# Patient Record
Sex: Male | Born: 1945 | Race: White | Hispanic: No | Marital: Single | Smoking: Never smoker
Health system: Southern US, Community
[De-identification: ages and names within clinical notes are randomized; demographics above are authoritative.]

## PROBLEM LIST (undated history)

## (undated) DIAGNOSIS — E7800 Pure hypercholesterolemia, unspecified: Secondary | ICD-10-CM

## (undated) DIAGNOSIS — M1990 Unspecified osteoarthritis, unspecified site: Secondary | ICD-10-CM

## (undated) DIAGNOSIS — M109 Gout, unspecified: Secondary | ICD-10-CM

## (undated) DIAGNOSIS — G47419 Narcolepsy without cataplexy: Secondary | ICD-10-CM

## (undated) DIAGNOSIS — F329 Major depressive disorder, single episode, unspecified: Secondary | ICD-10-CM

## (undated) DIAGNOSIS — K7460 Unspecified cirrhosis of liver: Secondary | ICD-10-CM

## (undated) DIAGNOSIS — I00 Rheumatic fever without heart involvement: Secondary | ICD-10-CM

## (undated) DIAGNOSIS — R188 Other ascites: Secondary | ICD-10-CM

## (undated) DIAGNOSIS — G4730 Sleep apnea, unspecified: Secondary | ICD-10-CM

## (undated) DIAGNOSIS — R600 Localized edema: Secondary | ICD-10-CM

## (undated) DIAGNOSIS — M87052 Idiopathic aseptic necrosis of left femur: Secondary | ICD-10-CM

## (undated) DIAGNOSIS — I10 Essential (primary) hypertension: Secondary | ICD-10-CM

## (undated) DIAGNOSIS — F32A Depression, unspecified: Secondary | ICD-10-CM

## (undated) DIAGNOSIS — T63331A Toxic effect of venom of brown recluse spider, accidental (unintentional), initial encounter: Secondary | ICD-10-CM

## (undated) HISTORY — PX: COSMETIC SURGERY: SHX468

## (undated) HISTORY — PX: FRACTURE SURGERY: SHX138

## (undated) HISTORY — PX: INGUINAL HERNIA REPAIR: SUR1180

## (undated) HISTORY — PX: FOOT FRACTURE SURGERY: SHX645

## (undated) SURGERY — EGD (ESOPHAGOGASTRODUODENOSCOPY)
Anesthesia: Monitor Anesthesia Care

---
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
## 2000-10-07 ENCOUNTER — Ambulatory Visit (HOSPITAL_COMMUNITY): Admission: RE | Admit: 2000-10-07 | Discharge: 2000-10-07 | Payer: Self-pay | Admitting: Pulmonary Disease

## 2002-02-14 ENCOUNTER — Ambulatory Visit: Admission: RE | Admit: 2002-02-14 | Discharge: 2002-02-14 | Payer: Self-pay | Admitting: Pulmonary Disease

## 2002-05-23 ENCOUNTER — Ambulatory Visit: Admission: RE | Admit: 2002-05-23 | Discharge: 2002-05-23 | Payer: Self-pay | Admitting: Pulmonary Disease

## 2002-06-14 ENCOUNTER — Ambulatory Visit (HOSPITAL_COMMUNITY): Admission: RE | Admit: 2002-06-14 | Discharge: 2002-06-14 | Payer: Self-pay | Admitting: Pulmonary Disease

## 2002-06-14 ENCOUNTER — Encounter: Payer: Self-pay | Admitting: Pulmonary Disease

## 2002-07-20 ENCOUNTER — Encounter (HOSPITAL_COMMUNITY): Admission: RE | Admit: 2002-07-20 | Discharge: 2002-08-19 | Payer: Self-pay | Admitting: Pulmonary Disease

## 2003-09-26 ENCOUNTER — Ambulatory Visit (HOSPITAL_COMMUNITY): Admission: RE | Admit: 2003-09-26 | Discharge: 2003-09-26 | Payer: Self-pay | Admitting: Pulmonary Disease

## 2005-05-28 ENCOUNTER — Ambulatory Visit: Payer: Self-pay | Admitting: Unknown Physician Specialty

## 2005-05-31 ENCOUNTER — Ambulatory Visit: Payer: Self-pay | Admitting: Specialist

## 2005-06-03 ENCOUNTER — Ambulatory Visit: Payer: Self-pay | Admitting: Specialist

## 2005-06-10 ENCOUNTER — Ambulatory Visit: Payer: Self-pay | Admitting: Specialist

## 2005-09-01 ENCOUNTER — Ambulatory Visit: Payer: Self-pay | Admitting: Unknown Physician Specialty

## 2007-11-08 ENCOUNTER — Ambulatory Visit (HOSPITAL_COMMUNITY): Admission: RE | Admit: 2007-11-08 | Discharge: 2007-11-08 | Payer: Self-pay | Admitting: Pulmonary Disease

## 2008-03-08 ENCOUNTER — Ambulatory Visit (HOSPITAL_COMMUNITY): Admission: RE | Admit: 2008-03-08 | Discharge: 2008-03-08 | Payer: Self-pay | Admitting: Pulmonary Disease

## 2008-03-11 ENCOUNTER — Inpatient Hospital Stay (HOSPITAL_COMMUNITY): Admission: RE | Admit: 2008-03-11 | Discharge: 2008-03-13 | Payer: Self-pay | Admitting: Orthopedic Surgery

## 2008-03-11 HISTORY — PX: TOTAL KNEE ARTHROPLASTY: SHX125

## 2008-05-30 ENCOUNTER — Emergency Department (HOSPITAL_COMMUNITY): Admission: EM | Admit: 2008-05-30 | Discharge: 2008-05-30 | Payer: Self-pay | Admitting: Emergency Medicine

## 2010-05-20 LAB — BASIC METABOLIC PANEL
CO2: 22 mEq/L (ref 19–32)
Chloride: 103 mEq/L (ref 96–112)
GFR calc Af Amer: >60 mL/min (ref >60)
Sodium: 140 mEq/L (ref 135–145)

## 2010-05-20 LAB — RAPID URINE DRUG SCREEN, HOSP PERFORMED
Benzodiazepines: NOT DETECTED
Cocaine: POSITIVE — AB
Tetrahydrocannabinol: POSITIVE — AB

## 2010-05-20 LAB — DIFFERENTIAL
Basophils Relative: 1 % (ref 0–1)
Eosinophils Absolute: 0.1 10*3/uL (ref 0.0–0.7)
Eosinophils Relative: 1 % (ref 0–5)
Monocytes Absolute: 0.9 10*3/uL (ref 0.1–1.0)
Monocytes Relative: 12 % (ref 3–12)

## 2010-05-20 LAB — CBC
Hemoglobin: 16.3 g/dL (ref 13.0–17.0)
MCHC: 35 g/dL (ref 30.0–36.0)
MCV: 96.7 fL (ref 78.0–100.0)
RBC: 4.81 MIL/uL (ref 4.22–5.81)

## 2010-05-25 LAB — COMPREHENSIVE METABOLIC PANEL
AST: 58 U/L — ABNORMAL HIGH (ref 0–37)
Albumin: 4 g/dL (ref 3.5–5.2)
Chloride: 103 mEq/L (ref 96–112)
Creatinine, Ser: 0.71 mg/dL (ref 0.4–1.5)
GFR calc Af Amer: >60 mL/min (ref >60)
Potassium: 4.2 mEq/L (ref 3.5–5.1)
Total Bilirubin: 1.6 mg/dL — ABNORMAL HIGH (ref 0.3–1.2)

## 2010-05-25 LAB — URINE CULTURE
Colony Count: NO GROWTH
Culture: NO GROWTH

## 2010-05-25 LAB — URINALYSIS, ROUTINE W REFLEX MICROSCOPIC
Glucose, UA: NEGATIVE mg/dL
Specific Gravity, Urine: 1.025 (ref 1.005–1.030)
pH: 5.5 (ref 5.0–8.0)

## 2010-05-25 LAB — DIFFERENTIAL
Basophils Absolute: 0 10*3/uL (ref 0.0–0.1)
Eosinophils Relative: 1 % (ref 0–5)
Lymphocytes Relative: 21 % (ref 12–46)
Monocytes Absolute: 1 10*3/uL (ref 0.1–1.0)

## 2010-05-25 LAB — CBC
MCV: 100.9 fL — ABNORMAL HIGH (ref 78.0–100.0)
Platelets: 228 10*3/uL (ref 150–400)
WBC: 9 10*3/uL (ref 4.0–10.5)

## 2010-05-26 LAB — CBC
HCT: 32.1 % — ABNORMAL LOW (ref 39.0–52.0)
Hemoglobin: 11.3 g/dL — ABNORMAL LOW (ref 13.0–17.0)
MCHC: 34.7 g/dL (ref 30.0–36.0)
MCHC: 35.3 g/dL (ref 30.0–36.0)
MCV: 100.4 fL — ABNORMAL HIGH (ref 78.0–100.0)
MCV: 103.5 fL — ABNORMAL HIGH (ref 78.0–100.0)
Platelets: 153 10*3/uL (ref 150–400)
RBC: 3.2 MIL/uL — ABNORMAL LOW (ref 4.22–5.81)
RDW: 12.4 % (ref 11.5–15.5)
RDW: 12.4 % (ref 11.5–15.5)
WBC: 10.7 10*3/uL — ABNORMAL HIGH (ref 4.0–10.5)

## 2010-05-26 LAB — BASIC METABOLIC PANEL
BUN: 7 mg/dL (ref 6–23)
BUN: 8 mg/dL (ref 6–23)
CO2: 25 mEq/L (ref 19–32)
CO2: 27 mEq/L (ref 19–32)
Calcium: 8.1 mg/dL — ABNORMAL LOW (ref 8.4–10.5)
Chloride: 102 mEq/L (ref 96–112)
Chloride: 98 mEq/L (ref 96–112)
Creatinine, Ser: 0.53 mg/dL (ref 0.4–1.5)
Creatinine, Ser: 0.79 mg/dL (ref 0.4–1.5)
GFR calc Af Amer: >60 mL/min (ref >60)
GFR calc Af Amer: >60 mL/min (ref >60)
GFR calc non Af Amer: >60 mL/min (ref >60)
Glucose, Bld: 125 mg/dL — ABNORMAL HIGH (ref 70–99)
Potassium: 3.4 mEq/L — ABNORMAL LOW (ref 3.5–5.1)
Potassium: 3.6 mEq/L (ref 3.5–5.1)
Sodium: 133 mEq/L — ABNORMAL LOW (ref 135–145)

## 2010-05-26 LAB — PROTIME-INR
INR: 1 (ref 0.00–1.49)
INR: 1.5 (ref 0.00–1.49)
Prothrombin Time: 13.8 seconds (ref 11.6–15.2)
Prothrombin Time: 18.4 seconds — ABNORMAL HIGH (ref 11.6–15.2)

## 2010-06-23 NOTE — Op Note (Signed)
NAME:  Lawrence West, Lawrence West NO.:  [id number]   MEDICAL RECORD NO.:  [id number]          PATIENT TYPE:  INP   LOCATION:  5040                         FACILITY:  [facility]   PHYSICIAN:  Elana Alm. Thurston Hole, M.D. 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   PROCEDURES:  1. Left total knee replacement using DePuy cemented total knee system      with #4 cemented femur, #5 cemented tibia with 15-mm polyethylene      RP tibial spacer, and 35-mm polyethylene cemented patella.  2. Left total knee computer-assisted navigation.   SURGEON:  Elana Alm. Thurston Hole, MD   ASSISTANT:  Julien Girt, PA   ANESTHESIA:  General.   OPERATIVE TIME:  1 hour and 45 minutes.   COMPLICATIONS:  None.   DESCRIPTION OF PROCEDURE:  Mr. Lawrence West was brought to the operating room  on March 11, 2008, after a femoral nerve block was placed in the  holding by Anesthesia.  He was placed on operative table in supine  position.  He received Ancef 1 g IV preoperatively for prophylaxis.  After being placed under general anesthesia, he had a Foley catheter  placed under sterile conditions.  His left knee was examined.  Range of  motion from -5 to 125 degrees with significant varus deformity, knee  stable, ligamentous exam with normal patellar tracking.  Left leg was  then prepped using sterile DuraPrep and draped using sterile technique.  Leg was exsanguinated and a thigh tourniquet elevated to 365 mm.  Initially, through a 15-cm longitudinal incision based over the patella,  initial exposure was made.  The underlying subcutaneous tissues were  incised along with skin incision.  A median arthrotomy was performed  revealing an  excessive amount of normal-appearing joint fluid.  The  articular surfaces were inspected.  He had grade 4 changes medially,  grade 3 and 4 changes laterally, and grade 3 and 4 changes in the  patellofemoral joint.  Osteophytes were removed from the femoral  condyles and tibial plateau.  The medial and lateral meniscus remnants  were removed as well as the anterior cruciate ligament.  A significant  synovectomy was also carried out due to his significant recurrent  effusions, but there was no sign of infection.  At this point, the  computer navigation system was used due to his severe varus deformity.  Two pins were placed in the proximal tibial metaphysis and 2 pins placed  in the distal femoral metaphysis, and the computer navigation system was  activated and measurements were taken.  He had 30 degrees varus  deformity and no flexion deformity noted.  At this point, then the  distal femoral cut was made using the computer navigation system  resecting 11 mm off the distal femur.  Distal femur was then sized, a #4  was found to be appropriate  size, and the #4 cutting jig was placed  using the navigation system in the appropriate manner of external  rotation, and then these cuts were made and verified as well.  The  proximal tibial cut was then also made using the navigation system  resecting 1 cm off the lateral and 2 mm off the medial or lower side,  again using the navigation system to correct his deformity, and these  cuts verified as well.  At this point, spacer blocks were placed.  A 15-  mm block gave excellent balancing, excellent stability, and excellent  correction of his flexion, and varus deformities also confirmed by the  navigation system.  At this point, the #5 tibial base plate trial was  placed on the cut tibial surface with an excellent fit and the keel cut  was made.  PCL box cutter was then placed on the distal femur and these  cuts were made as well.  At this point, the  #4 femoral trial was placed  and with a #5 tibial base plate trial and a 15-mm polyethylene RP tibial  spacer, the knee was reduced, taken through range of motion from 0-125  degrees with excellent stability and excellent correction of his flexion  and varus deformities, all this confirmed as well by the computer  navigation system with normal patellar tracking.  At this point,  resurfacing 10-mm cut was made on the patella and 3 locking holes placed  for a 35-mm patellar trial.  The patella trial was again placed and  again patellofemoral tracking was evaluated and found to be normal.  At  this point, it was felt that all the trial components were of excellent  size, fit, and stability.  All the deformities had been corrected and  verified by the navigation system.  At this point, the navigation system  was deactivated and the pins were removed.  The trial components were  removed.  The knee was jet lavaged and irrigated with 3 L of saline.  The proximal tibia was exposed and #4 tibial baseplate with cement  backing with Zinacef mixed the into the cement due to his recurrent  effusions was placed and excess cement being removed from around the  edges.  A #4 femoral component with cement backing was then hammered  into place in position and excess cement being removed from around the  edges.  The 15-mm polyethylene RP tibial spacer was placed on tibial  baseplate.  The knee reduced an taken through range of motion from 0-125  degrees with excellent stability and excellent correction of his flexion  and varus deformities.  The 35-mm polyethylene cement backed patella was  then placed in its position and held there with a clamp.  After the  cement had hardened, again patellofemoral tracking was evaluated and  found to be normal.  At this point, it was felt that all the components  were of excellent size, fit, and stability.  The wound was further  irrigated with saline and then the  tourniquet was released.  Hemostasis  was obtained with cautery.  The arthrotomy was then closed with #1  Ethibond suture over 2 medium Hemovac drains.  Subcutaneous tissues were  closed with 0 and 2-0 Vicryl, subcuticular layer was closed with 4-0  Monocryl.  Sterile dressings, long-leg splint applied, and then the  patient was awakened, extubated, and taken to recovery room in stable  condition.  Needle and sponge counts were correct x2 at the end of the  case.  Neurovascular status normal.  Pulses 2+ and symmetric.      Robert A. Thurston Hole, M.D.  Electronically Signed     RAW/MEDQ  D:  03/11/2008  T:  03/12/2008  Job:  [id number]

## 2011-09-30 ENCOUNTER — Other Ambulatory Visit: Payer: Self-pay | Admitting: Orthopedic Surgery

## 2011-09-30 DIAGNOSIS — M79606 Pain in leg, unspecified: Secondary | ICD-10-CM

## 2011-10-01 ENCOUNTER — Ambulatory Visit
Admission: RE | Admit: 2011-10-01 | Discharge: 2011-10-01 | Disposition: A | Discharge status: Home / Self Care | Payer: Medicare Other | Source: Ambulatory Visit | Attending: Orthopedic Surgery | Admitting: Orthopedic Surgery

## 2011-10-01 DIAGNOSIS — M79606 Pain in leg, unspecified: Secondary | ICD-10-CM

## 2011-12-01 ENCOUNTER — Ambulatory Visit: Payer: Self-pay | Admitting: Orthopedic Surgery

## 2011-12-10 ENCOUNTER — Ambulatory Visit: Payer: Self-pay | Admitting: Orthopedic Surgery

## 2012-01-09 ENCOUNTER — Ambulatory Visit: Payer: Self-pay | Admitting: Orthopedic Surgery

## 2013-09-03 ENCOUNTER — Other Ambulatory Visit (HOSPITAL_COMMUNITY): Payer: Self-pay | Admitting: Pulmonary Disease

## 2013-09-03 ENCOUNTER — Ambulatory Visit (HOSPITAL_COMMUNITY)
Admission: RE | Admit: 2013-09-03 | Discharge: 2013-09-03 | Disposition: A | Discharge status: Home / Self Care | Payer: Medicare HMO | Source: Ambulatory Visit | Attending: Pulmonary Disease | Admitting: Pulmonary Disease

## 2013-09-03 DIAGNOSIS — M79609 Pain in unspecified limb: Secondary | ICD-10-CM | POA: Diagnosis present

## 2013-09-03 DIAGNOSIS — M79671 Pain in right foot: Secondary | ICD-10-CM

## 2013-09-03 DIAGNOSIS — M19079 Primary osteoarthritis, unspecified ankle and foot: Secondary | ICD-10-CM | POA: Diagnosis not present

## 2013-09-03 DIAGNOSIS — M7730 Calcaneal spur, unspecified foot: Secondary | ICD-10-CM | POA: Insufficient documentation

## 2013-10-23 ENCOUNTER — Emergency Department: Payer: Self-pay | Admitting: Emergency Medicine

## 2013-10-23 LAB — DRUG SCREEN, URINE
AMPHETAMINES, UR SCREEN: NEGATIVE (ref ?–1000)
BARBITURATES, UR SCREEN: NEGATIVE (ref ?–200)
BENZODIAZEPINE, UR SCRN: NEGATIVE (ref ?–200)
Cannabinoid 50 Ng, Ur ~~LOC~~: NEGATIVE (ref ?–50)
Cocaine Metabolite,Ur ~~LOC~~: NEGATIVE (ref ?–300)
MDMA (Ecstasy)Ur Screen: NEGATIVE (ref ?–500)
METHADONE, UR SCREEN: NEGATIVE (ref ?–300)
Opiate, Ur Screen: POSITIVE (ref ?–300)
PHENCYCLIDINE (PCP) UR S: NEGATIVE (ref ?–25)
TRICYCLIC, UR SCREEN: NEGATIVE (ref ?–1000)

## 2013-10-23 LAB — CBC
HCT: 48.9 % (ref 40.0–52.0)
HGB: 16.1 g/dL (ref 13.0–18.0)
MCH: 34.5 pg — AB (ref 26.0–34.0)
MCHC: 32.9 g/dL (ref 32.0–36.0)
MCV: 105 fL — ABNORMAL HIGH (ref 80–100)
PLATELETS: 155 10*3/uL (ref 150–440)
RBC: 4.67 10*6/uL (ref 4.40–5.90)
RDW: 14.2 % (ref 11.5–14.5)
WBC: 11.2 10*3/uL — AB (ref 3.8–10.6)

## 2013-10-23 LAB — TSH: Thyroid Stimulating Horm: 1.25 u[IU]/mL

## 2013-10-23 LAB — SALICYLATE LEVEL

## 2013-10-23 LAB — URINALYSIS, COMPLETE
BILIRUBIN, UR: NEGATIVE
BLOOD: NEGATIVE
Bacteria: NONE SEEN
GLUCOSE, UR: NEGATIVE mg/dL (ref 0–75)
Ketone: NEGATIVE
LEUKOCYTE ESTERASE: NEGATIVE
NITRITE: NEGATIVE
PH: 7 (ref 4.5–8.0)
PROTEIN: NEGATIVE
RBC, UR: NONE SEEN /HPF (ref 0–5)
Specific Gravity: 1.01 (ref 1.003–1.030)
WBC UR: <1 /HPF (ref 0–5)

## 2013-10-23 LAB — COMPREHENSIVE METABOLIC PANEL
ALBUMIN: 3.7 g/dL (ref 3.4–5.0)
ALT: 41 U/L
Alkaline Phosphatase: 82 U/L
Anion Gap: 10 (ref 7–16)
BUN: 8 mg/dL (ref 7–18)
Bilirubin,Total: 1.8 mg/dL — ABNORMAL HIGH (ref 0.2–1.0)
Calcium, Total: 9.4 mg/dL (ref 8.5–10.1)
Chloride: 104 mmol/L (ref 98–107)
Co2: 24 mmol/L (ref 21–32)
Creatinine: 0.95 mg/dL (ref 0.60–1.30)
EGFR (Non-African Amer.): >60
Glucose: 157 mg/dL — ABNORMAL HIGH (ref 65–99)
OSMOLALITY: 277 (ref 275–301)
Potassium: 3.6 mmol/L (ref 3.5–5.1)
SGOT(AST): 73 U/L — ABNORMAL HIGH (ref 15–37)
SODIUM: 138 mmol/L (ref 136–145)
Total Protein: 8.5 g/dL — ABNORMAL HIGH (ref 6.4–8.2)

## 2013-10-23 LAB — ACETAMINOPHEN LEVEL: Acetaminophen: <2 ug/mL

## 2013-10-23 LAB — TROPONIN I

## 2013-10-23 LAB — ETHANOL: Ethanol: <3 mg/dL

## 2014-04-05 DIAGNOSIS — Z79899 Other long term (current) drug therapy: Secondary | ICD-10-CM | POA: Diagnosis not present

## 2014-04-05 DIAGNOSIS — I10 Essential (primary) hypertension: Secondary | ICD-10-CM | POA: Diagnosis not present

## 2014-04-05 DIAGNOSIS — E780 Pure hypercholesterolemia: Secondary | ICD-10-CM | POA: Diagnosis not present

## 2014-04-05 DIAGNOSIS — F419 Anxiety disorder, unspecified: Secondary | ICD-10-CM | POA: Diagnosis not present

## 2014-04-23 DIAGNOSIS — M79605 Pain in left leg: Secondary | ICD-10-CM | POA: Diagnosis not present

## 2014-05-09 DIAGNOSIS — G4733 Obstructive sleep apnea (adult) (pediatric): Secondary | ICD-10-CM | POA: Diagnosis not present

## 2014-06-08 DIAGNOSIS — G4733 Obstructive sleep apnea (adult) (pediatric): Secondary | ICD-10-CM | POA: Diagnosis not present

## 2014-07-09 DIAGNOSIS — G4733 Obstructive sleep apnea (adult) (pediatric): Secondary | ICD-10-CM | POA: Diagnosis not present

## 2014-07-25 DIAGNOSIS — F419 Anxiety disorder, unspecified: Secondary | ICD-10-CM | POA: Diagnosis not present

## 2014-07-25 DIAGNOSIS — F900 Attention-deficit hyperactivity disorder, predominantly inattentive type: Secondary | ICD-10-CM | POA: Diagnosis not present

## 2014-07-25 DIAGNOSIS — I10 Essential (primary) hypertension: Secondary | ICD-10-CM | POA: Diagnosis not present

## 2014-07-25 DIAGNOSIS — E780 Pure hypercholesterolemia: Secondary | ICD-10-CM | POA: Diagnosis not present

## 2014-09-05 DIAGNOSIS — G4733 Obstructive sleep apnea (adult) (pediatric): Secondary | ICD-10-CM | POA: Diagnosis not present

## 2014-10-06 DIAGNOSIS — G4733 Obstructive sleep apnea (adult) (pediatric): Secondary | ICD-10-CM | POA: Diagnosis not present

## 2014-10-17 DIAGNOSIS — G4733 Obstructive sleep apnea (adult) (pediatric): Secondary | ICD-10-CM | POA: Diagnosis not present

## 2014-10-17 DIAGNOSIS — I10 Essential (primary) hypertension: Secondary | ICD-10-CM | POA: Diagnosis not present

## 2014-10-18 DIAGNOSIS — Z79899 Other long term (current) drug therapy: Secondary | ICD-10-CM | POA: Diagnosis not present

## 2014-10-18 DIAGNOSIS — E780 Pure hypercholesterolemia: Secondary | ICD-10-CM | POA: Diagnosis not present

## 2014-10-21 DIAGNOSIS — Z87898 Personal history of other specified conditions: Secondary | ICD-10-CM | POA: Diagnosis not present

## 2014-11-06 DIAGNOSIS — G4733 Obstructive sleep apnea (adult) (pediatric): Secondary | ICD-10-CM | POA: Diagnosis not present

## 2014-12-06 DIAGNOSIS — G4733 Obstructive sleep apnea (adult) (pediatric): Secondary | ICD-10-CM | POA: Diagnosis not present

## 2015-01-06 DIAGNOSIS — G4733 Obstructive sleep apnea (adult) (pediatric): Secondary | ICD-10-CM | POA: Diagnosis not present

## 2015-02-05 DIAGNOSIS — G4733 Obstructive sleep apnea (adult) (pediatric): Secondary | ICD-10-CM | POA: Diagnosis not present

## 2015-02-21 DIAGNOSIS — Z79899 Other long term (current) drug therapy: Secondary | ICD-10-CM | POA: Diagnosis not present

## 2015-02-21 DIAGNOSIS — Z125 Encounter for screening for malignant neoplasm of prostate: Secondary | ICD-10-CM | POA: Diagnosis not present

## 2015-02-21 DIAGNOSIS — E7800 Pure hypercholesterolemia, unspecified: Secondary | ICD-10-CM | POA: Diagnosis not present

## 2015-02-28 ENCOUNTER — Other Ambulatory Visit: Payer: Self-pay | Admitting: Family Medicine

## 2015-02-28 DIAGNOSIS — Z0000 Encounter for general adult medical examination without abnormal findings: Secondary | ICD-10-CM | POA: Diagnosis not present

## 2015-02-28 DIAGNOSIS — Z1211 Encounter for screening for malignant neoplasm of colon: Secondary | ICD-10-CM | POA: Diagnosis not present

## 2015-02-28 DIAGNOSIS — E7800 Pure hypercholesterolemia, unspecified: Secondary | ICD-10-CM | POA: Diagnosis not present

## 2015-02-28 DIAGNOSIS — R748 Abnormal levels of other serum enzymes: Secondary | ICD-10-CM

## 2015-02-28 DIAGNOSIS — Z79899 Other long term (current) drug therapy: Secondary | ICD-10-CM | POA: Diagnosis not present

## 2015-03-08 DIAGNOSIS — G4733 Obstructive sleep apnea (adult) (pediatric): Secondary | ICD-10-CM | POA: Diagnosis not present

## 2015-03-18 ENCOUNTER — Ambulatory Visit
Admission: RE | Admit: 2015-03-18 | Discharge: 2015-03-18 | Disposition: A | Discharge status: Home / Self Care | Payer: Commercial Managed Care - HMO | Source: Ambulatory Visit | Attending: Family Medicine | Admitting: Family Medicine

## 2015-03-18 DIAGNOSIS — K7460 Unspecified cirrhosis of liver: Secondary | ICD-10-CM | POA: Diagnosis not present

## 2015-03-18 DIAGNOSIS — R188 Other ascites: Secondary | ICD-10-CM | POA: Insufficient documentation

## 2015-03-18 DIAGNOSIS — R748 Abnormal levels of other serum enzymes: Secondary | ICD-10-CM | POA: Diagnosis present

## 2015-04-07 ENCOUNTER — Other Ambulatory Visit: Payer: Self-pay | Admitting: Physician Assistant

## 2015-04-07 DIAGNOSIS — Z1211 Encounter for screening for malignant neoplasm of colon: Secondary | ICD-10-CM | POA: Diagnosis not present

## 2015-04-07 DIAGNOSIS — R932 Abnormal findings on diagnostic imaging of liver and biliary tract: Secondary | ICD-10-CM | POA: Diagnosis not present

## 2015-04-07 DIAGNOSIS — K7031 Alcoholic cirrhosis of liver with ascites: Secondary | ICD-10-CM

## 2015-04-07 DIAGNOSIS — R7989 Other specified abnormal findings of blood chemistry: Secondary | ICD-10-CM | POA: Diagnosis not present

## 2015-04-08 DIAGNOSIS — G4733 Obstructive sleep apnea (adult) (pediatric): Secondary | ICD-10-CM | POA: Diagnosis not present

## 2015-04-11 ENCOUNTER — Ambulatory Visit
Admission: RE | Admit: 2015-04-11 | Discharge: 2015-04-11 | Disposition: A | Discharge status: Home / Self Care | Payer: Commercial Managed Care - HMO | Source: Ambulatory Visit | Attending: Physician Assistant | Admitting: Physician Assistant

## 2015-04-11 DIAGNOSIS — K7031 Alcoholic cirrhosis of liver with ascites: Secondary | ICD-10-CM

## 2015-05-06 ENCOUNTER — Other Ambulatory Visit: Payer: Self-pay | Admitting: Physician Assistant

## 2015-05-06 DIAGNOSIS — K7031 Alcoholic cirrhosis of liver with ascites: Secondary | ICD-10-CM

## 2015-05-06 DIAGNOSIS — R188 Other ascites: Secondary | ICD-10-CM

## 2015-05-10 HISTORY — PX: US GUIDED PARACENTESIS (ARMC HX): HXRAD1166

## 2015-05-12 ENCOUNTER — Ambulatory Visit: Admission: RE | Admit: 2015-05-12 | Payer: Medicare HMO | Source: Ambulatory Visit

## 2015-05-13 ENCOUNTER — Ambulatory Visit
Admission: RE | Admit: 2015-05-13 | Discharge: 2015-05-13 | Disposition: A | Discharge status: Home / Self Care | Payer: Commercial Managed Care - HMO | Source: Ambulatory Visit | Attending: Physician Assistant | Admitting: Physician Assistant

## 2015-05-13 ENCOUNTER — Other Ambulatory Visit: Payer: Medicare HMO

## 2015-05-13 ENCOUNTER — Ambulatory Visit: Payer: Medicare HMO

## 2015-05-13 DIAGNOSIS — R188 Other ascites: Secondary | ICD-10-CM

## 2015-05-13 DIAGNOSIS — K7031 Alcoholic cirrhosis of liver with ascites: Secondary | ICD-10-CM

## 2015-05-13 NOTE — Procedures (Signed)
US paracentesis  Complications:  None  Blood Loss: none  See dictation in canopy pacs  

## 2015-05-19 ENCOUNTER — Inpatient Hospital Stay
Admission: EM | Admit: 2015-05-19 | Discharge: 2015-05-20 | DRG: 434 | Disposition: A | Discharge status: Home / Self Care | Payer: Commercial Managed Care - HMO | Attending: Internal Medicine | Admitting: Internal Medicine

## 2015-05-19 DIAGNOSIS — G8929 Other chronic pain: Secondary | ICD-10-CM | POA: Diagnosis not present

## 2015-05-19 DIAGNOSIS — R188 Other ascites: Secondary | ICD-10-CM | POA: Diagnosis not present

## 2015-05-19 DIAGNOSIS — F329 Major depressive disorder, single episode, unspecified: Secondary | ICD-10-CM | POA: Diagnosis not present

## 2015-05-19 DIAGNOSIS — G47419 Narcolepsy without cataplexy: Secondary | ICD-10-CM | POA: Diagnosis not present

## 2015-05-19 DIAGNOSIS — E876 Hypokalemia: Secondary | ICD-10-CM | POA: Diagnosis present

## 2015-05-19 DIAGNOSIS — R0602 Shortness of breath: Secondary | ICD-10-CM | POA: Diagnosis not present

## 2015-05-19 DIAGNOSIS — Z7982 Long term (current) use of aspirin: Secondary | ICD-10-CM | POA: Diagnosis not present

## 2015-05-19 DIAGNOSIS — K7031 Alcoholic cirrhosis of liver with ascites: Principal | ICD-10-CM | POA: Diagnosis present

## 2015-05-19 DIAGNOSIS — E785 Hyperlipidemia, unspecified: Secondary | ICD-10-CM | POA: Diagnosis present

## 2015-05-19 DIAGNOSIS — Z79899 Other long term (current) drug therapy: Secondary | ICD-10-CM | POA: Diagnosis not present

## 2015-05-19 DIAGNOSIS — Z801 Family history of malignant neoplasm of trachea, bronchus and lung: Secondary | ICD-10-CM | POA: Diagnosis not present

## 2015-05-19 DIAGNOSIS — Z96652 Presence of left artificial knee joint: Secondary | ICD-10-CM | POA: Diagnosis present

## 2015-05-19 DIAGNOSIS — I10 Essential (primary) hypertension: Secondary | ICD-10-CM | POA: Diagnosis not present

## 2015-05-19 DIAGNOSIS — M109 Gout, unspecified: Secondary | ICD-10-CM | POA: Diagnosis not present

## 2015-05-19 HISTORY — DX: Unspecified cirrhosis of liver: K74.60

## 2015-05-19 HISTORY — DX: Other ascites: R18.8

## 2015-05-19 HISTORY — DX: Essential (primary) hypertension: I10

## 2015-05-19 LAB — COMPREHENSIVE METABOLIC PANEL
ALT: 23 U/L (ref 17–63)
AST: 50 U/L — AB (ref 15–41)
Albumin: 2.7 g/dL — ABNORMAL LOW (ref 3.5–5.0)
Alkaline Phosphatase: 88 U/L (ref 38–126)
Anion gap: 6 (ref 5–15)
BUN: 18 mg/dL (ref 6–20)
CHLORIDE: 103 mmol/L (ref 101–111)
CO2: 24 mmol/L (ref 22–32)
CREATININE: 1.26 mg/dL — AB (ref 0.61–1.24)
Calcium: 8.7 mg/dL — ABNORMAL LOW (ref 8.9–10.3)
GFR, EST NON AFRICAN AMERICAN: 57 mL/min — AB (ref >60)
Glucose, Bld: 112 mg/dL — ABNORMAL HIGH (ref 65–99)
POTASSIUM: 3.9 mmol/L (ref 3.5–5.1)
SODIUM: 133 mmol/L — AB (ref 135–145)
TOTAL PROTEIN: 6.7 g/dL (ref 6.5–8.1)
Total Bilirubin: 1.5 mg/dL — ABNORMAL HIGH (ref 0.3–1.2)

## 2015-05-19 LAB — CBC
HCT: 34.8 % — ABNORMAL LOW (ref 40.0–52.0)
Hemoglobin: 11.9 g/dL — ABNORMAL LOW (ref 13.0–18.0)
MCH: 32.1 pg (ref 26.0–34.0)
MCHC: 34.2 g/dL (ref 32.0–36.0)
MCV: 94 fL (ref 80.0–100.0)
PLATELETS: 184 10*3/uL (ref 150–440)
RBC: 3.7 MIL/uL — ABNORMAL LOW (ref 4.40–5.90)
RDW: 14.6 % — AB (ref 11.5–14.5)
WBC: 12.1 10*3/uL — AB (ref 3.8–10.6)

## 2015-05-19 LAB — LIPASE, BLOOD: LIPASE: 28 U/L (ref 11–51)

## 2015-05-19 LAB — PROTIME-INR
INR: 1.16
PROTHROMBIN TIME: 15 s (ref 11.4–15.0)

## 2015-05-19 MED ORDER — ASPIRIN EC 81 MG PO TBEC
81.0000 mg | DELAYED_RELEASE_TABLET | Freq: Every day | ORAL | Status: DC
  Administered 2015-05-20: 81 mg via ORAL
  Filled 2015-05-19: qty 1

## 2015-05-19 MED ORDER — ATORVASTATIN CALCIUM 10 MG PO TABS
10.0000 mg | ORAL_TABLET | Freq: Every day | ORAL | Status: DC
  Administered 2015-05-19 – 2015-05-20 (×2): 10 mg via ORAL
  Filled 2015-05-19 (×2): qty 1

## 2015-05-19 MED ORDER — ADULT MULTIVITAMIN W/MINERALS CH
1.0000 | ORAL_TABLET | Freq: Every day | ORAL | Status: DC
  Administered 2015-05-20: 1 via ORAL
  Filled 2015-05-19: qty 1

## 2015-05-19 MED ORDER — BUPROPION HCL ER (XL) 150 MG PO TB24
150.0000 mg | ORAL_TABLET | Freq: Every day | ORAL | Status: DC
Start: 2015-05-19 — End: 2015-05-20
  Administered 2015-05-19 – 2015-05-20 (×2): 150 mg via ORAL
  Filled 2015-05-19 (×2): qty 1

## 2015-05-19 MED ORDER — SPIRONOLACTONE 25 MG PO TABS
25.0000 mg | ORAL_TABLET | Freq: Every day | ORAL | Status: DC
  Administered 2015-05-19 – 2015-05-20 (×2): 25 mg via ORAL
  Filled 2015-05-19 (×2): qty 1

## 2015-05-19 MED ORDER — LOSARTAN POTASSIUM 50 MG PO TABS
50.0000 mg | ORAL_TABLET | Freq: Every day | ORAL | Status: DC
  Administered 2015-05-19 – 2015-05-20 (×2): 50 mg via ORAL
  Filled 2015-05-19 (×2): qty 1

## 2015-05-19 MED ORDER — POTASSIUM CHLORIDE CRYS ER 20 MEQ PO TBCR
20.0000 meq | EXTENDED_RELEASE_TABLET | Freq: Every day | ORAL | Status: DC
  Administered 2015-05-19 – 2015-05-20 (×2): 20 meq via ORAL
  Filled 2015-05-19 (×2): qty 1

## 2015-05-19 MED ORDER — ONDANSETRON HCL 4 MG/2ML IJ SOLN: 4.0000 mg | Freq: Four times a day (QID) | INTRAMUSCULAR | Status: DC | PRN

## 2015-05-19 MED ORDER — FUROSEMIDE 40 MG PO TABS
40.0000 mg | ORAL_TABLET | Freq: Every day | ORAL | Status: DC
  Administered 2015-05-19 – 2015-05-20 (×2): 40 mg via ORAL
  Filled 2015-05-19 (×2): qty 1

## 2015-05-19 MED ORDER — ONDANSETRON HCL 4 MG PO TABS: 4.0000 mg | ORAL_TABLET | Freq: Four times a day (QID) | ORAL | Status: DC | PRN

## 2015-05-19 MED ORDER — ACETAMINOPHEN 650 MG RE SUPP
650.0000 mg | Freq: Four times a day (QID) | RECTAL | Status: DC | PRN
  Filled 2015-05-19: qty 1

## 2015-05-19 MED ORDER — HYDROCHLOROTHIAZIDE 12.5 MG PO CAPS
12.5000 mg | ORAL_CAPSULE | Freq: Every day | ORAL | Status: DC
  Administered 2015-05-19 – 2015-05-20 (×2): 12.5 mg via ORAL
  Filled 2015-05-19 (×2): qty 1

## 2015-05-19 MED ORDER — ACETAMINOPHEN 325 MG PO TABS
650.0000 mg | ORAL_TABLET | Freq: Four times a day (QID) | ORAL | Status: DC | PRN
Start: 2015-05-19 — End: 2015-05-20
  Administered 2015-05-20: 650 mg via ORAL
  Filled 2015-05-19: qty 2

## 2015-05-19 MED ORDER — LOSARTAN POTASSIUM-HCTZ 50-12.5 MG PO TABS: 1.0000 | ORAL_TABLET | Freq: Every day | ORAL | Status: DC

## 2015-05-19 NOTE — ED Notes (Signed)
Pt sent by PCP for admission for ascites, SOB, ETOH cirrhosis and weakness.. States had 3L drawn off last week..Marland Kitchen

## 2015-05-19 NOTE — Progress Notes (Signed)
Patient wears CPAP machine at night. MD called to notify. Orders received for CPAP use.

## 2015-05-19 NOTE — ED Notes (Addendum)
Pt reports ascites, and lower leg edema.  Pt reports it has been going on since December, but worse today.  Pt reports 3.5L drained from abdomen a week ago.  Pt reports pain to left knee, states arthritis w/ knee replacement a few years ago.  Pt reports some SOB with ascites.  Pt NAD at this time, resp equal and unlabored, skin warm and dry.

## 2015-05-19 NOTE — H&P (Signed)
[facility]   PATIENT NAME: Lawrence AcreDavid Humble    MR#:  [id number]  DATE OF BIRTH:  02/09/1945  DATE OF ADMISSION:  05/19/2015  PRIMARY CARE PHYSICIAN: BABAOFF, Lavada MesiMARC E, MD   REQUESTING/REFERRING PHYSICIAN: Dr. Ileana RoupJames McShane  CHIEF COMPLAINT:   Chief Complaint  Patient presents with  . Ascites  . Shortness of Breath    HISTORY OF PRESENT ILLNESS:  Lawrence West  is a 70 y.o. male with a known history of Alcoholic liver cirrhosis, hypertension, history of tense ascites status post paracentesis who presents to the hospital from his gastroenterologist's office due to abdominal distention and shortness of breath. Patient recently had a ultrasound-guided paracentesis done about 6 days ago and had 3.5 L of fluid removed. He returns to his gastroenterologist office for follow-up and was noted to have significant abdominal distention with shortness of breath on exertion and referred to the ER for further evaluation. Patient does admit to some abdominal pain due to distention but denies any fevers chills nausea vomiting diarrhea or any other associated symptoms presently. Patient said that he has been taking his diuretics but his swelling in his abdomen has not improved. Hospitalist services were contacted further treatment and evaluation.  PAST MEDICAL HISTORY:   Past Medical History  Diagnosis Date  . Cirrhosis of liver (HCC)   . Ascites   . Hypertension     PAST SURGICAL HISTORY:   Past Surgical History  Procedure Laterality Date  . Joint replacement      left knee  . Cosmetic surgery    . Hernia repair      SOCIAL HISTORY:   Social History  Substance Use Topics  . Smoking status: Never Smoker   . Smokeless tobacco: Not on file  . Alcohol Use: No     Comment: former Heavy ETOH abuse.     FAMILY HISTORY:   Family History  Problem Relation Age of Onset  . Melanoma Father   . Lung cancer Father     DRUG ALLERGIES:  No Known  Allergies  REVIEW OF SYSTEMS:   Review of Systems  Constitutional: Negative for fever and weight loss.  HENT: Negative for congestion, nosebleeds and tinnitus.   Eyes: Negative for blurred vision, double vision and redness.  Respiratory: Positive for shortness of breath. Negative for cough and hemoptysis.   Cardiovascular: Negative for chest pain, orthopnea, leg swelling and PND.  Gastrointestinal: Positive for abdominal pain. Negative for nausea, vomiting, diarrhea and melena.       Abdominal distention  Genitourinary: Negative for dysuria, urgency and hematuria.  Musculoskeletal: Negative for joint pain and falls.  Neurological: Negative for dizziness, tingling, sensory change, focal weakness, seizures, weakness and headaches.  Endo/Heme/Allergies: Negative for polydipsia. Does not bruise/bleed easily.  Psychiatric/Behavioral: Negative for depression and memory loss. The patient is not nervous/anxious.     MEDICATIONS AT HOME:   Prior to Admission medications   Medication Sig Start Date End Date Taking? Authorizing Provider  aspirin EC 81 MG tablet Take 81 mg by mouth daily.   Yes Historical Provider, MD  atorvastatin (LIPITOR) 10 MG tablet Take 10 mg by mouth daily.   Yes Historical Provider, MD  buPROPion (WELLBUTRIN XL) 150 MG 24 hr tablet Take 150 mg by mouth daily.   Yes Historical Provider, MD  furosemide (LASIX) 20 MG tablet Take 20 mg by mouth daily.   Yes Historical Provider, MD  losartan-hydrochlorothiazide (HYZAAR) 50-12.5 MG tablet Take 1 tablet by mouth  daily.   Yes Historical Provider, MD  meloxicam (MOBIC) 15 MG tablet Take 15 mg by mouth daily as needed for pain.    Yes Historical Provider, MD  Multiple Vitamin (MULTIVITAMIN WITH MINERALS) TABS tablet Take 1 tablet by mouth daily.   Yes Historical Provider, MD  potassium chloride SA (K-DUR,KLOR-CON) 20 MEQ tablet Take 20 mEq by mouth daily.    Yes Historical Provider, MD      VITAL SIGNS:  Blood pressure 132/99,  pulse 90, temperature 98.3 F (36.8 C), temperature source Oral, resp. rate 16, height  (1.753 m), weight 102.059 kg (225 lb), SpO2 97 %.  PHYSICAL EXAMINATION:  Physical Exam  GENERAL:  70 y.o.-year-old patient lying in the bed in no acute distress.  EYES: Pupils equal, round, reactive to light and accommodation. No scleral icterus. Extraocular muscles intact.  HEENT: Head atraumatic, normocephalic. Oropharynx and nasopharynx clear. No oropharyngeal erythema, moist oral mucosa  NECK:  Supple, no jugular venous distention. No thyroid enlargement, no tenderness.  LUNGS: Normal breath sounds bilaterally, no wheezing, rales, rhonchi. No use of accessory muscles of respiration.  CARDIOVASCULAR: S1, S2 RRR. No murmurs, rubs, gallops, clicks.  ABDOMEN: Soft, nontender, distended, positive fluid wave consistent with ascites.. Bowel sounds present. No organomegaly or mass.  EXTREMITIES: +2 pitting edema from the knees to the ankles bilaterally, no cyanosis, or clubbing. + 2 pedal & radial pulses b/l.   NEUROLOGIC: Cranial nerves II through XII are intact. No focal Motor or sensory deficits appreciated b/l PSYCHIATRIC: The patient is alert and oriented x 3. Good affect.  SKIN: No obvious rash, lesion, or ulcer.   LABORATORY PANEL:   CBC  Recent Labs Lab 05/19/15 1455  WBC 12.1*  HGB 11.9*  HCT 34.8*  PLT 184   ------------------------------------------------------------------------------------------------------------------  Chemistries   Recent Labs Lab 05/19/15 1455  NA 133*  K 3.9  CL 103  CO2 24  GLUCOSE 112*  BUN 18  CREATININE 1.26*  CALCIUM 8.7*  AST 50*  ALT 23  ALKPHOS 88  BILITOT 1.5*   ------------------------------------------------------------------------------------------------------------------  Cardiac Enzymes No results for input(s): TROPONINI in the last 168  hours. ------------------------------------------------------------------------------------------------------------------  RADIOLOGY:  No results found.   IMPRESSION AND PLAN:   70 year old male with past medical history of alcohol abuse, alcoholic liver cirrhosis, history of ascites, hypertension who presents to the hospital due to abdominal distention and pain and also shortness of breath.  1. Alcoholic liver cirrhosis with ascites-this is the cause of patient's abdominal pain, distention and shortness of breath. -I will increase the patient's diuretics. arranged for large volume paracentesis for tomorrow. Maryclare Labrador follow clinically.  2. Essential hypertension-continue losartan/HCTZ.  3. Depression-continue Wellbutrin.  4. Hyperlipidemia-continue atorvastatin.    All the records are reviewed and case discussed with ED provider. Management plans discussed with the patient, family and they are in agreement.  CODE STATUS: Full  TOTAL TIME TAKING CARE OF THIS PATIENT: 45 minutes.    Houston Siren M.D on 05/19/2015 at 6:57 PM  Between 7am to 6pm - Pager - [phone]  After 6pm go to [email or web address] - password [email or web address] [facility]  [facility]  Office  [phone]  CC: Primary care physician; BABAOFF, Lavada Mesi, MD

## 2015-05-19 NOTE — ED Provider Notes (Signed)
[facility] Emergency Department Provider Note  ____________________________________________   I have reviewed the triage vital signs and the nursing notes.   HISTORY  Chief Complaint Ascites and Shortness of Breath    HPI Lawrence West is a 70 y.o. male  history of ascites, he states last week he did have it drained that he had to come back today because the ascites is getting so bad it is making him somewhat short of breath. Patient states he has chronic knee pain but he has had no fever,no leg swelling. His primary care doctor sent him here for admission for his ascites. He denies any abdominal pain or fever. He denies any nausea vomiting or diarrhea. He denies any cough or other shortness of breath aside from that which she treats to his ascites pressing on his lungs. He denies any chest pain.     Past Medical History  Diagnosis Date  . Cirrhosis of liver (HCC)   . Ascites   . Hypertension     Patient Active Problem List   Diagnosis Date Noted  . Ascites due to alcoholic cirrhosis (HCC) 05/19/2015    Past Surgical History  Procedure Laterality Date  . Joint replacement      left knee  . Cosmetic surgery    . Hernia repair      No current outpatient prescriptions on file.  Allergies Review of patient's allergies indicates no known allergies.  Family History  Problem Relation Age of Onset  . Melanoma Father   . Lung cancer Father     Social History Social History  Substance Use Topics  . Smoking status: Never Smoker   . Smokeless tobacco: None  . Alcohol Use: No     Comment: former Heavy ETOH abuse.     Review of Systems Constitutional: No fever/chills Eyes: No visual changes. ENT: No sore throat. No stiff neck no neck pain Cardiovascular: Denies chest pain. Respiratory: History of present illness regarding shortness of breath. Gastrointestinal:   no vomiting.  No diarrhea.  No constipation. Genitourinary: Negative for  dysuria. Musculoskeletal: Negative lower extremity swelling Skin: Negative for rash. Neurological: Negative for headaches, focal weakness or numbness. 10-point ROS otherwise negative.  ____________________________________________   PHYSICAL EXAM:  VITAL SIGNS: ED Triage Vitals  Enc Vitals Group     BP 05/19/15 1449 126/83 mmHg     Pulse Rate 05/19/15 1449 104     Resp 05/19/15 1449 18     Temp 05/19/15 1449 98.3 F (36.8 C)     Temp Source 05/19/15 1449 Oral     SpO2 05/19/15 1449 96 %     Weight 05/19/15 1449 225 lb (102.059 kg)     Height 05/19/15 1449 5\' 9"  (1.753 m)     Head Cir --      Peak Flow --      Pain Score 05/19/15 1452 8     Pain Loc --      Pain Edu? --      Excl. in GC? --     Constitutional: Alert and oriented. Well appearing and in no acute distress. Eyes: Conjunctivae are normal. PERRL. EOMI. Head: Atraumatic. Nose: No congestion/rhinnorhea. Mouth/Throat: Mucous membranes are moist.  Oropharynx non-erythematous. Neck: No stridor.   Nontender with no meningismus Cardiovascular: Normal rate, regular rhythm. Grossly normal heart sounds.  Good peripheral circulation. Respiratory: Normal respiratory effort.  No retractions. Lungs CTAB. Abdominal: Soft and nontender. Significant distention consistent with ascites. No guarding no rebound Back:  There is no focal tenderness or step off there is no midline tenderness there are no lesions noted. there is no CVA tenderness Musculoskeletal: No lower extremity tenderness. No joint effusions, no DVT signs strong distal pulses no edema Neurologic:  Normal speech and language. No gross focal neurologic deficits are appreciated.  Skin:  Skin is warm, dry and intact. No rash noted. Psychiatric: Mood and affect are normal. Speech and behavior are normal.  ____________________________________________   LABS (all labs ordered are listed, but only abnormal results are displayed)  Labs Reviewed  COMPREHENSIVE METABOLIC  PANEL - Abnormal; Notable for the following:    Sodium 133 (*)    Glucose, Bld 112 (*)    Creatinine, Ser 1.26 (*)    Calcium 8.7 (*)    Albumin 2.7 (*)    AST 50 (*)    Total Bilirubin 1.5 (*)    GFR calc non Af Amer 57 (*)    All other components within normal limits  CBC - Abnormal; Notable for the following:    WBC 12.1 (*)    RBC 3.70 (*)    Hemoglobin 11.9 (*)    HCT 34.8 (*)    RDW 14.6 (*)    All other components within normal limits  LIPASE, BLOOD  PROTIME-INR  CBC  COMPREHENSIVE METABOLIC PANEL   ____________________________________________  EKG  I personally interpreted any EKGs ordered by me or triage  ____________________________________________  RADIOLOGY  I reviewed any imaging ordered by me or triage that were performed during my shift and, if possible, patient and/or family made aware of any abnormal findings. ____________________________________________   PROCEDURES  Procedure(s) performed: None  Critical Care performed: None  ____________________________________________   INITIAL IMPRESSION / ASSESSMENT AND PLAN / ED COURSE  Pertinent labs & imaging results that were available during my care of the patient were reviewed by me and considered in my medical decision making (see chart for details).  Patient with significant ascites, no evidence however of spontaneous bacteria peritonitis. We will admit him for drainage and further evaluation. Patient has no evidence at this time of an infected joint and is knee pain appears to be chronic arthritic changes with no evidence of acute trauma or infection. ____________________________________________   FINAL CLINICAL IMPRESSION(S) / ED DIAGNOSES  Final diagnoses:  Ascites      This chart was dictated using voice recognition software.  Despite best efforts to proofread,  errors can occur which can change meaning.     Jeanmarie Plant, MD 05/19/15 2033

## 2015-05-20 ENCOUNTER — Inpatient Hospital Stay: Payer: Commercial Managed Care - HMO

## 2015-05-20 ENCOUNTER — Encounter: Payer: Self-pay | Admitting: *Deleted

## 2015-05-20 LAB — COMPREHENSIVE METABOLIC PANEL
ALBUMIN: 2.3 g/dL — AB (ref 3.5–5.0)
ALK PHOS: 71 U/L (ref 38–126)
ALT: 20 U/L (ref 17–63)
AST: 44 U/L — ABNORMAL HIGH (ref 15–41)
Anion gap: 6 (ref 5–15)
BUN: 17 mg/dL (ref 6–20)
CALCIUM: 7.9 mg/dL — AB (ref 8.9–10.3)
CO2: 23 mmol/L (ref 22–32)
CREATININE: 1.08 mg/dL (ref 0.61–1.24)
Chloride: 103 mmol/L (ref 101–111)
GFR calc non Af Amer: >60 mL/min (ref >60)
GLUCOSE: 89 mg/dL (ref 65–99)
Potassium: 3.2 mmol/L — ABNORMAL LOW (ref 3.5–5.1)
SODIUM: 132 mmol/L — AB (ref 135–145)
Total Bilirubin: 1.5 mg/dL — ABNORMAL HIGH (ref 0.3–1.2)
Total Protein: 5.9 g/dL — ABNORMAL LOW (ref 6.5–8.1)

## 2015-05-20 LAB — CBC
HEMATOCRIT: 32.3 % — AB (ref 40.0–52.0)
HEMOGLOBIN: 11.2 g/dL — AB (ref 13.0–18.0)
MCH: 32.9 pg (ref 26.0–34.0)
MCHC: 34.7 g/dL (ref 32.0–36.0)
MCV: 94.9 fL (ref 80.0–100.0)
Platelets: 159 10*3/uL (ref 150–440)
RBC: 3.4 MIL/uL — AB (ref 4.40–5.90)
RDW: 14.4 % (ref 11.5–14.5)
WBC: 10.1 10*3/uL (ref 3.8–10.6)

## 2015-05-20 LAB — PROTEIN, BODY FLUID: Total protein, fluid: <3 g/dL

## 2015-05-20 LAB — BODY FLUID CELL COUNT WITH DIFFERENTIAL
Eos, Fluid: 2 %
LYMPHS FL: 28 %
Monocyte-Macrophage-Serous Fluid: 59 %
NEUTROPHIL FLUID: 11 %
Other Cells, Fluid: 0 %
WBC FLUID: 150 uL

## 2015-05-20 LAB — ALBUMIN, FLUID (OTHER): Albumin, Fluid: <1 g/dL

## 2015-05-20 LAB — AMYLASE, BODY FLUID: Amylase, Fluid: 10 U/L

## 2015-05-20 LAB — AMMONIA: AMMONIA: 52 umol/L — AB (ref 9–35)

## 2015-05-20 MED ORDER — LOSARTAN POTASSIUM-HCTZ 50-12.5 MG PO TABS: 1.0000 | ORAL_TABLET | Freq: Every day | ORAL | Status: DC

## 2015-05-20 MED ORDER — MELOXICAM 15 MG PO TABS: 15.0000 mg | ORAL_TABLET | Freq: Every day | ORAL | Status: DC | PRN

## 2015-05-20 MED ORDER — FUROSEMIDE 40 MG PO TABS: 40.0000 mg | ORAL_TABLET | Freq: Every day | ORAL | Status: DC

## 2015-05-20 MED ORDER — POTASSIUM CHLORIDE CRYS ER 20 MEQ PO TBCR: 20.0000 meq | EXTENDED_RELEASE_TABLET | Freq: Every day | ORAL | Status: DC

## 2015-05-20 MED ORDER — ASPIRIN EC 81 MG PO TBEC: 81.0000 mg | DELAYED_RELEASE_TABLET | Freq: Every day | ORAL | Status: DC

## 2015-05-20 MED ORDER — SPIRONOLACTONE 25 MG PO TABS: 25.0000 mg | ORAL_TABLET | Freq: Every day | ORAL | Status: DC

## 2015-05-20 MED ORDER — LACTULOSE 20 GM/30ML PO SOLN: 30.0000 mL | Freq: Two times a day (BID) | ORAL | Status: DC

## 2015-05-20 MED ORDER — BUPROPION HCL ER (XL) 150 MG PO TB24: 150.0000 mg | ORAL_TABLET | Freq: Every day | ORAL | Status: DC

## 2015-05-20 MED ORDER — ATORVASTATIN CALCIUM 10 MG PO TABS: 10.0000 mg | ORAL_TABLET | Freq: Every day | ORAL | Status: AC

## 2015-05-20 NOTE — Discharge Summary (Signed)
[facility] - [place] at [facility]   PATIENT NAME: Lawrence West    MR#:  [id number]  DATE OF BIRTH:  11-30-1945  DATE OF ADMISSION:  05/19/2015 ADMITTING PHYSICIAN: Houston Siren, MD  DATE OF DISCHARGE: 05/20/15  PRIMARY CARE PHYSICIAN: BABAOFF, Lavada Mesi, MD    ADMISSION DIAGNOSIS:  Ascites [R18.8]  DISCHARGE DIAGNOSIS:  Active Problems:   Ascites due to alcoholic cirrhosis (HCC)   SECONDARY DIAGNOSIS:   Past Medical History  Diagnosis Date  . Cirrhosis of liver (HCC)   . Ascites   . Hypertension   . Arthritis   . Gout   . Rheumatic aortic disease   . Narcolepsy     HOSPITAL COURSE:   70 year old male with past medical history of alcohol abuse, alcoholic liver cirrhosis, history of ascites, hypertension who presents to the hospital due to abdominal distention and pain and also shortness of breath.  1. Alcoholic liver cirrhosis with ascites- getting worse, last paracentesis was on 05/13/15, now again 4.5L drained today - last hepatic US in feb 2017 with worse cirrhosis - GI f/u as outpatient - ammonia slightly elevated at 52, started lactulose - also diuretics adjusted- lasix dose increased and aldactone started  2. Essential hypertension-continue losartan/HCTZ.  3. Depression-continue Wellbutrin.  4. Hyperlipidemia-continue atorvastatin. Might need to be discontinued by PCP in light of his liver disease. No indication noted.  5. Hypokalemia- replaced   Patient will be discharged home today  DISCHARGE CONDITIONS:   Guarded  CONSULTS OBTAINED:   None  DRUG ALLERGIES:  No Known Allergies  DISCHARGE MEDICATIONS:   Current Discharge Medication List    START taking these medications   Details  Lactulose 20 GM/30ML SOLN Take 30 mLs (20 g total) by mouth 2 (two) times daily. HOLD FOR > 3 bowel movements /day Qty: 946 mL, Refills: 3    spironolactone (ALDACTONE) 25 MG tablet Take 1 tablet (25 mg total) by mouth daily. Qty: 30  tablet, Refills: 2      CONTINUE these medications which have CHANGED   Details  aspirin EC 81 MG tablet Take 1 tablet (81 mg total) by mouth daily. Qty: 30 tablet, Refills: 2    atorvastatin (LIPITOR) 10 MG tablet Take 1 tablet (10 mg total) by mouth daily. Qty: 30 tablet, Refills: 2    buPROPion (WELLBUTRIN XL) 150 MG 24 hr tablet Take 1 tablet (150 mg total) by mouth daily. Qty: 30 tablet, Refills: 2    furosemide (LASIX) 40 MG tablet Take 1 tablet (40 mg total) by mouth daily. Qty: 30 tablet, Refills: 2    losartan-hydrochlorothiazide (HYZAAR) 50-12.5 MG tablet Take 1 tablet by mouth daily. Qty: 30 tablet, Refills: 2    meloxicam (MOBIC) 15 MG tablet Take 1 tablet (15 mg total) by mouth daily as needed for pain. Qty: 20 tablet, Refills: 0    potassium chloride SA (K-DUR,KLOR-CON) 20 MEQ tablet Take 1 tablet (20 mEq total) by mouth daily. Qty: 30 tablet, Refills: 2      CONTINUE these medications which have NOT CHANGED   Details  Multiple Vitamin (MULTIVITAMIN WITH MINERALS) TABS tablet Take 1 tablet by mouth daily.         DISCHARGE INSTRUCTIONS:   1. PCP f/u in 2 weeks 2. GI f/u in 1 week  If you experience worsening of your admission symptoms, develop shortness of breath, life threatening emergency, suicidal or homicidal thoughts you must seek medical attention immediately by calling 911 or calling your MD immediately  if symptoms less severe.  You Must read complete instructions/literature along with all the possible adverse reactions/side effects for all the Medicines you take and that have been prescribed to you. Take any new Medicines after you have completely understood and accept all the possible adverse reactions/side effects.   Please note  You were cared for by a hospitalist during your hospital stay. If you have any questions about your discharge medications or the care you received while you were in the hospital after you are discharged, you can call the  unit and asked to speak with the hospitalist on call if the hospitalist that took care of you is not available. Once you are discharged, your primary care physician will handle any further medical issues. Please note that NO REFILLS for any discharge medications will be authorized once you are discharged, as it is imperative that you return to your primary care physician (or establish a relationship with a primary care physician if you do not have one) for your aftercare needs so that they can reassess your need for medications and monitor your lab values.    Today   CHIEF COMPLAINT:   Chief Complaint  Patient presents with  . Ascites  . Shortness of Breath    VITAL SIGNS:  Blood pressure 138/94, pulse 88, temperature 98.4 F (36.9 C), temperature source Oral, resp. rate 20, height  (1.753 m), weight 102.059 kg (225 lb), SpO2 99 %.  I/O:   Intake/Output Summary (Last 24 hours) at 05/20/15 1506 Last data filed at 05/20/15 0810  Gross per 24 hour  Intake      0 ml  Output    800 ml  Net   -800 ml    PHYSICAL EXAMINATION:   Physical Exam  GENERAL: 70 y.o.-year-old patient lying in the bed in no acute distress.  EYES: Pupils equal, round, reactive to light and accommodation. No scleral icterus. Extraocular muscles intact.  HEENT: Head atraumatic, normocephalic. Oropharynx and nasopharynx clear. No oropharyngeal erythema, moist oral mucosa  NECK: Supple, no jugular venous distention. No thyroid enlargement, no tenderness.  LUNGS: Normal breath sounds bilaterally, no wheezing, rales, rhonchi. No use of accessory muscles of respiration.  CARDIOVASCULAR: S1, S2 RRR. No murmurs, rubs, gallops, clicks.  ABDOMEN: Soft, nontender, mildly distended,  Bowel sounds present. No organomegaly or mass.  EXTREMITIES: +2 pitting edema from the knees to the ankles bilaterally, no cyanosis, or clubbing. + 2 pedal & radial pulses b/l.  NEUROLOGIC: Cranial nerves II through XII are  intact. No focal Motor or sensory deficits appreciated b/l PSYCHIATRIC: The patient is alert and oriented x 3. Minimal confusion noted, sometimes repeating himself.Marland Kitchen  SKIN: No obvious rash, lesion, or ulcer  DATA REVIEW:   CBC  Recent Labs Lab 05/20/15 0515  WBC 10.1  HGB 11.2*  HCT 32.3*  PLT 159    Chemistries   Recent Labs Lab 05/20/15 0515  NA 132*  K 3.2*  CL 103  CO2 23  GLUCOSE 89  BUN 17  CREATININE 1.08  CALCIUM 7.9*  AST 44*  ALT 20  ALKPHOS 71  BILITOT 1.5*    Cardiac Enzymes No results for input(s): TROPONINI in the last 168 hours.  Microbiology Results  Results for orders placed or performed during the hospital encounter of 05/19/15  Culture, body fluid-bottle     Status: None (Preliminary result)   Collection Time: 05/20/15 10:00 AM  Result Value Ref Range Status   Specimen Description ABDOMEN  Final   Special  Requests NONE  Final   Gram Stain   Final    FEW WBC SEEN FEW RED BLOOD CELLS NO ORGANISMS SEEN    Culture PENDING  Incomplete   Report Status PENDING  Incomplete    RADIOLOGY:  [place] Paracentesis  05/20/2015  INDICATION: Abdominal ascites. EXAM: ULTRASOUND GUIDED PARACENTESIS MEDICATIONS: None. COMPLICATIONS: None immediate. PROCEDURE: Informed written consent was obtained from the patient after a discussion of the risks, benefits and alternatives to treatment. A timeout was performed prior to the initiation of the procedure. Initial ultrasound scanning demonstrates a large amount of ascites within the right lower abdominal quadrant. The right lower abdomen was prepped and draped in the usual sterile fashion. 1% lidocaine with epinephrine was used for local anesthesia. Following this, a 6 Fr Safe-T-Centesis catheter was introduced. An ultrasound image was saved for documentation purposes. The paracentesis was performed. The catheter was removed and a dressing was applied. The patient tolerated the procedure well without immediate post  procedural complication. FINDINGS: A total of approximately 4.5 L of yellow fluid was removed. Samples were sent to the laboratory as requested by the clinical team. IMPRESSION: Successful ultrasound-guided paracentesis yielding 4.5 L liters of peritoneal fluid. Electronically Signed   By: Richarda OverlieAdam  Henn M.D.   On: 05/20/2015 13:54    EKG:   Orders placed or performed in visit on 10/23/13  . EKG 12-Lead      Management plans discussed with the patient, family and they are in agreement.  CODE STATUS:     Code Status Orders        Start     Ordered   05/19/15 1944  Full code   Continuous     05/19/15 1943    Code Status History    Date Active Date Inactive Code Status Order ID Comments User Context   This patient has a current code status but no historical code status.      TOTAL TIME TAKING CARE OF THIS PATIENT: 37 minutes.    Enid BaasKALISETTI,Leeon Makar M.D on 05/20/2015 at 3:06 PM  Between 7am to 6pm - Pager - [phone]  After 6pm go to [email or web address] - password [email or web address] [facility]  [facility]  Office  [phone]  CC: Primary care physician; BABAOFF, Lavada MesiMARC E, MD

## 2015-05-20 NOTE — Progress Notes (Signed)
Patient is to be discharged home today. Patient is in no acute distress at this time, and assessment is unchanged from this morning. Patient's IV is out, discharge paperwork has been discussed with patient/family and there are no questions or concerns at this time. Patient will be accompanied downstairs by staff and family via wheelchair.   

## 2015-05-20 NOTE — Procedures (Signed)
US guided paracentesis yielding 4.5 liters of yellow ascites.  No immediate complication.

## 2015-05-21 ENCOUNTER — Encounter: Admission: RE | Payer: Self-pay | Source: Ambulatory Visit

## 2015-05-21 ENCOUNTER — Ambulatory Visit
Admission: RE | Admit: 2015-05-21 | Payer: Commercial Managed Care - HMO | Source: Ambulatory Visit | Admitting: Unknown Physician Specialty

## 2015-05-21 HISTORY — DX: Gout, unspecified: M10.9

## 2015-05-21 HISTORY — DX: Narcolepsy without cataplexy: G47.419

## 2015-05-21 HISTORY — DX: Unspecified osteoarthritis, unspecified site: M19.90

## 2015-05-21 LAB — MISC LABCORP TEST (SEND OUT): Labcorp test code: 19588

## 2015-05-21 SURGERY — COLONOSCOPY WITH PROPOFOL
Anesthesia: General

## 2015-05-24 LAB — CULTURE, BODY FLUID-BOTTLE

## 2015-05-24 LAB — CULTURE, BODY FLUID W GRAM STAIN -BOTTLE: Culture: NO GROWTH

## 2015-05-28 DIAGNOSIS — K7031 Alcoholic cirrhosis of liver with ascites: Secondary | ICD-10-CM | POA: Diagnosis not present

## 2015-05-28 DIAGNOSIS — R945 Abnormal results of liver function studies: Secondary | ICD-10-CM | POA: Diagnosis not present

## 2015-05-28 DIAGNOSIS — Z1211 Encounter for screening for malignant neoplasm of colon: Secondary | ICD-10-CM | POA: Diagnosis not present

## 2015-05-28 DIAGNOSIS — E876 Hypokalemia: Secondary | ICD-10-CM | POA: Diagnosis not present

## 2015-06-04 ENCOUNTER — Other Ambulatory Visit: Payer: Self-pay | Admitting: Physician Assistant

## 2015-06-04 ENCOUNTER — Ambulatory Visit: Admission: RE | Admit: 2015-06-04 | Payer: Commercial Managed Care - HMO | Source: Ambulatory Visit

## 2015-06-04 ENCOUNTER — Ambulatory Visit
Admission: RE | Admit: 2015-06-04 | Discharge: 2015-06-04 | Disposition: A | Discharge status: Home / Self Care | Payer: Commercial Managed Care - HMO | Source: Ambulatory Visit | Attending: Physician Assistant | Admitting: Physician Assistant

## 2015-06-04 DIAGNOSIS — K7031 Alcoholic cirrhosis of liver with ascites: Secondary | ICD-10-CM

## 2015-06-04 DIAGNOSIS — R188 Other ascites: Secondary | ICD-10-CM | POA: Diagnosis not present

## 2015-06-04 NOTE — Procedures (Signed)
US paracentesis without difficulty  Complications:  None  Blood Loss: none  See dictation in canopy pacs  

## 2015-06-19 DIAGNOSIS — K7031 Alcoholic cirrhosis of liver with ascites: Secondary | ICD-10-CM | POA: Diagnosis not present

## 2015-06-23 DIAGNOSIS — M1612 Unilateral primary osteoarthritis, left hip: Secondary | ICD-10-CM | POA: Diagnosis not present

## 2015-06-23 DIAGNOSIS — M25562 Pain in left knee: Secondary | ICD-10-CM | POA: Diagnosis not present

## 2015-06-25 ENCOUNTER — Other Ambulatory Visit: Payer: Self-pay | Admitting: Orthopedic Surgery

## 2015-06-25 DIAGNOSIS — M1612 Unilateral primary osteoarthritis, left hip: Secondary | ICD-10-CM | POA: Diagnosis not present

## 2015-06-26 NOTE — Pre-Procedure Instructions (Signed)
Lawrence BalesDavid H West  06/26/2015     Your procedure is scheduled on : Tuesday Jul 08, 2015 at 7:30 AM.  [facility] at 5:30 AM.  Call this number if you have problems the morning of surgery: [phone]    Remember:  Do not eat food or drink liquids after midnight.  Take these medicines the morning of surgery with A SIP OF WATER : Bupropion (Wellbutrin)   Stop taking any vitamins, herbal medications/supplements, NSAIDs, Ibuprofen, Advil, Motrin, Aleve, Meloxicam/Mobic, etc on Tuesday May 23rd   Do not wear jewelry.  Do not wear lotions, powders, or cologne.    Men may shave face and neck.  Do not bring valuables to the hospital.  [facility] is not responsible for any belongings or valuables.  Contacts, dentures or bridgework may not be worn into surgery.  Leave your suitcase in the car.  After surgery it may be brought to your room.  For patients admitted to the hospital, discharge time will be determined by your treatment team.  Patients discharged the day of surgery will not be allowed to drive home.   Name and phone number of your driver:    Special instructions:  Shower using CHG soap the night before and the morning of your surgery  Please read over the following fact sheets that you were given. Pain Booklet, Coughing and Deep Breathing, Total Joint Packet, MRSA Information and Surgical Site Infection Prevention

## 2015-06-27 ENCOUNTER — Encounter (HOSPITAL_COMMUNITY): Payer: Self-pay

## 2015-06-27 ENCOUNTER — Encounter (HOSPITAL_COMMUNITY)
Admission: RE | Admit: 2015-06-27 | Discharge: 2015-06-27 | Disposition: A | Discharge status: Home / Self Care | Payer: Commercial Managed Care - HMO | Source: Ambulatory Visit | Attending: Orthopedic Surgery | Admitting: Orthopedic Surgery

## 2015-06-27 DIAGNOSIS — I10 Essential (primary) hypertension: Secondary | ICD-10-CM | POA: Diagnosis not present

## 2015-06-27 DIAGNOSIS — Z01812 Encounter for preprocedural laboratory examination: Secondary | ICD-10-CM | POA: Diagnosis not present

## 2015-06-27 DIAGNOSIS — M1612 Unilateral primary osteoarthritis, left hip: Secondary | ICD-10-CM | POA: Diagnosis not present

## 2015-06-27 DIAGNOSIS — Z79899 Other long term (current) drug therapy: Secondary | ICD-10-CM | POA: Diagnosis not present

## 2015-06-27 DIAGNOSIS — G4733 Obstructive sleep apnea (adult) (pediatric): Secondary | ICD-10-CM | POA: Insufficient documentation

## 2015-06-27 DIAGNOSIS — Z01818 Encounter for other preprocedural examination: Secondary | ICD-10-CM | POA: Diagnosis not present

## 2015-06-27 DIAGNOSIS — Z7982 Long term (current) use of aspirin: Secondary | ICD-10-CM | POA: Diagnosis not present

## 2015-06-27 DIAGNOSIS — K7030 Alcoholic cirrhosis of liver without ascites: Secondary | ICD-10-CM | POA: Insufficient documentation

## 2015-06-27 DIAGNOSIS — R9431 Abnormal electrocardiogram [ECG] [EKG]: Secondary | ICD-10-CM | POA: Insufficient documentation

## 2015-06-27 HISTORY — DX: Localized edema: R60.0

## 2015-06-27 HISTORY — DX: Major depressive disorder, single episode, unspecified: F32.9

## 2015-06-27 HISTORY — DX: Rheumatic fever without heart involvement: I00

## 2015-06-27 HISTORY — DX: Depression, unspecified: F32.A

## 2015-06-27 HISTORY — DX: Sleep apnea, unspecified: G47.30

## 2015-06-27 LAB — CBC
HCT: 31.5 % — ABNORMAL LOW (ref 39.0–52.0)
Hemoglobin: 10.3 g/dL — ABNORMAL LOW (ref 13.0–17.0)
MCH: 30.5 pg (ref 26.0–34.0)
MCHC: 32.7 g/dL (ref 30.0–36.0)
MCV: 93.2 fL (ref 78.0–100.0)
PLATELETS: 135 10*3/uL — AB (ref 150–400)
RBC: 3.38 MIL/uL — ABNORMAL LOW (ref 4.22–5.81)
RDW: 15 % (ref 11.5–15.5)
WBC: 7.9 10*3/uL (ref 4.0–10.5)

## 2015-06-27 LAB — COMPREHENSIVE METABOLIC PANEL
ALBUMIN: 2.5 g/dL — AB (ref 3.5–5.0)
ALK PHOS: 62 U/L (ref 38–126)
ALT: 23 U/L (ref 17–63)
AST: 44 U/L — ABNORMAL HIGH (ref 15–41)
Anion gap: 8 (ref 5–15)
BUN: 16 mg/dL (ref 6–20)
CALCIUM: 9.2 mg/dL (ref 8.9–10.3)
CO2: 25 mmol/L (ref 22–32)
Chloride: 105 mmol/L (ref 101–111)
Creatinine, Ser: 1.34 mg/dL — ABNORMAL HIGH (ref 0.61–1.24)
GFR calc non Af Amer: 53 mL/min — ABNORMAL LOW (ref >60)
Glucose, Bld: 101 mg/dL — ABNORMAL HIGH (ref 65–99)
POTASSIUM: 4.2 mmol/L (ref 3.5–5.1)
SODIUM: 138 mmol/L (ref 135–145)
Total Bilirubin: 1.2 mg/dL (ref 0.3–1.2)
Total Protein: 6.8 g/dL (ref 6.5–8.1)

## 2015-06-27 LAB — SURGICAL PCR SCREEN
MRSA, PCR: NEGATIVE
STAPHYLOCOCCUS AUREUS: POSITIVE — AB

## 2015-06-27 NOTE — Progress Notes (Signed)
Nurse called patient to inquire about Rheumatic Aortic Valve Disease and patient denied ever having this, but informed Nurse that he did have rheumatic fever. Patient also denied having a cardiologist and stated "I had a stress test and a full work up at [facility] about 10 years ago, but they said everything was fine."  Patient had a paracentesis in April 2017, and sees Dr. Sung Amabileari Richards for cirrhosis of the liver. Patient informed Nurse that he used to drink alcoholic beverages a lot, but denied any current usage.  Will send chart to Anesthesia for review.

## 2015-06-27 NOTE — Progress Notes (Signed)
PCP is Kandyce RudMarcus Babaoff  Patient informed Nurse that he had a stress test at [facility] > 5 years ago. Patient denied having a cardiac cath or any cardiac or pulmonary issues, but did inform Nurse that he has sleep apnea but does "not use the machine because it broke six months ago and I have not gotten it fixed yet." Patient also stated that after his knee replacement surgery, he was placed on oxygen and it "worked just fine....Marland Kitchen.Marland Kitchen.I have no problem going to sleep. When I lay down, I can be sleep in sixty seconds." Nurse explained the importance of having and wearing CPAP if needed, but patient stated "I'm going to use the oxygen while I'm here in the hospital because they told me last time when I was here that it worked better than my CPAP."  Patient has no plans of having CPAP machine fixed.

## 2015-06-27 NOTE — Progress Notes (Signed)
Nurse called patient and informed him of positive PCR results and [facility]. Patient verbalized understanding.

## 2015-06-30 DIAGNOSIS — K7031 Alcoholic cirrhosis of liver with ascites: Secondary | ICD-10-CM | POA: Diagnosis not present

## 2015-06-30 DIAGNOSIS — M1612 Unilateral primary osteoarthritis, left hip: Secondary | ICD-10-CM | POA: Diagnosis not present

## 2015-06-30 DIAGNOSIS — D638 Anemia in other chronic diseases classified elsewhere: Secondary | ICD-10-CM | POA: Diagnosis not present

## 2015-06-30 NOTE — Progress Notes (Signed)
Anesthesia Chart Review:  Pt is a 70 year old male scheduled for L total hip arthroplasty on 07/08/2015 with Dr. Dion SaucierLandau.   PCP is Dr. Kandyce RudMarcus Babaoff (care everywhere). GI is Sung Amabileari Richards, PA/ Dr. Lynnae Prudeobert Elliott (care everywhere).   PMH includes:  HTN, OSA (no CPAP), alcoholic liver cirrhosis (denies current alcohol use), rheumatic fever. Never smoker. BMI 29  Medications include: ASA, lipitor, lasix, losartan-hctz, potassium, spironolactone  Preoperative labs reviewed.    EKG 06/27/15: NSR. Left axis deviation. Low voltage QRS  If no changes, I anticipate pt can proceed with surgery as scheduled.   Rica Mastngela Dalina Samara, FNP-BC [facility]/Anesthesiology Phone: [phone] 06/30/2015 3:17 PM

## 2015-07-08 ENCOUNTER — Inpatient Hospital Stay (HOSPITAL_COMMUNITY)
Admission: RE | Admit: 2015-07-08 | Discharge: 2015-07-11 | DRG: 470 | Disposition: A | Discharge status: Skilled Nursing Facility | Payer: Commercial Managed Care - HMO | Source: Ambulatory Visit | Attending: Orthopedic Surgery | Admitting: Orthopedic Surgery

## 2015-07-08 ENCOUNTER — Encounter (HOSPITAL_COMMUNITY): Payer: Self-pay | Admitting: General Practice

## 2015-07-08 ENCOUNTER — Inpatient Hospital Stay (HOSPITAL_COMMUNITY): Payer: Commercial Managed Care - HMO

## 2015-07-08 ENCOUNTER — Inpatient Hospital Stay (HOSPITAL_COMMUNITY): Payer: Commercial Managed Care - HMO | Admitting: Anesthesiology

## 2015-07-08 ENCOUNTER — Encounter (HOSPITAL_COMMUNITY)
Admission: RE | Disposition: A | Discharge status: Skilled Nursing Facility | Payer: Self-pay | Source: Ambulatory Visit | Attending: Orthopedic Surgery

## 2015-07-08 ENCOUNTER — Inpatient Hospital Stay (HOSPITAL_COMMUNITY): Payer: Commercial Managed Care - HMO | Admitting: Vascular Surgery

## 2015-07-08 DIAGNOSIS — G4730 Sleep apnea, unspecified: Secondary | ICD-10-CM | POA: Diagnosis not present

## 2015-07-08 DIAGNOSIS — Z96642 Presence of left artificial hip joint: Secondary | ICD-10-CM | POA: Diagnosis not present

## 2015-07-08 DIAGNOSIS — Z7982 Long term (current) use of aspirin: Secondary | ICD-10-CM | POA: Diagnosis not present

## 2015-07-08 DIAGNOSIS — M109 Gout, unspecified: Secondary | ICD-10-CM | POA: Diagnosis present

## 2015-07-08 DIAGNOSIS — G47419 Narcolepsy without cataplexy: Secondary | ICD-10-CM | POA: Diagnosis not present

## 2015-07-08 DIAGNOSIS — Z801 Family history of malignant neoplasm of trachea, bronchus and lung: Secondary | ICD-10-CM | POA: Diagnosis not present

## 2015-07-08 DIAGNOSIS — Z808 Family history of malignant neoplasm of other organs or systems: Secondary | ICD-10-CM | POA: Diagnosis not present

## 2015-07-08 DIAGNOSIS — Z4789 Encounter for other orthopedic aftercare: Secondary | ICD-10-CM | POA: Diagnosis not present

## 2015-07-08 DIAGNOSIS — R278 Other lack of coordination: Secondary | ICD-10-CM | POA: Diagnosis not present

## 2015-07-08 DIAGNOSIS — M169 Osteoarthritis of hip, unspecified: Secondary | ICD-10-CM | POA: Diagnosis not present

## 2015-07-08 DIAGNOSIS — M879 Osteonecrosis, unspecified: Principal | ICD-10-CM | POA: Diagnosis present

## 2015-07-08 DIAGNOSIS — K7460 Unspecified cirrhosis of liver: Secondary | ICD-10-CM | POA: Diagnosis present

## 2015-07-08 DIAGNOSIS — M1612 Unilateral primary osteoarthritis, left hip: Secondary | ICD-10-CM | POA: Diagnosis not present

## 2015-07-08 DIAGNOSIS — Z79899 Other long term (current) drug therapy: Secondary | ICD-10-CM

## 2015-07-08 DIAGNOSIS — Z96649 Presence of unspecified artificial hip joint: Secondary | ICD-10-CM

## 2015-07-08 DIAGNOSIS — I10 Essential (primary) hypertension: Secondary | ICD-10-CM | POA: Diagnosis present

## 2015-07-08 DIAGNOSIS — M8788 Other osteonecrosis, other site: Secondary | ICD-10-CM | POA: Diagnosis not present

## 2015-07-08 DIAGNOSIS — M6281 Muscle weakness (generalized): Secondary | ICD-10-CM | POA: Diagnosis not present

## 2015-07-08 DIAGNOSIS — D62 Acute posthemorrhagic anemia: Secondary | ICD-10-CM | POA: Diagnosis not present

## 2015-07-08 DIAGNOSIS — F329 Major depressive disorder, single episode, unspecified: Secondary | ICD-10-CM | POA: Diagnosis present

## 2015-07-08 DIAGNOSIS — M87052 Idiopathic aseptic necrosis of left femur: Secondary | ICD-10-CM | POA: Diagnosis present

## 2015-07-08 DIAGNOSIS — Z471 Aftercare following joint replacement surgery: Secondary | ICD-10-CM | POA: Diagnosis not present

## 2015-07-08 DIAGNOSIS — M1990 Unspecified osteoarthritis, unspecified site: Secondary | ICD-10-CM | POA: Diagnosis present

## 2015-07-08 DIAGNOSIS — M1610 Unilateral primary osteoarthritis, unspecified hip: Secondary | ICD-10-CM

## 2015-07-08 DIAGNOSIS — R2689 Other abnormalities of gait and mobility: Secondary | ICD-10-CM | POA: Diagnosis not present

## 2015-07-08 HISTORY — DX: Pure hypercholesterolemia, unspecified: E78.00

## 2015-07-08 HISTORY — PX: TOTAL HIP ARTHROPLASTY: SHX124

## 2015-07-08 HISTORY — DX: Toxic effect of venom of brown recluse spider, accidental (unintentional), initial encounter: T63.331A

## 2015-07-08 HISTORY — DX: Idiopathic aseptic necrosis of left femur: M87.052

## 2015-07-08 SURGERY — ARTHROPLASTY, HIP, TOTAL,POSTERIOR APPROACH
Anesthesia: General | Site: Hip | Laterality: Left

## 2015-07-08 MED ORDER — ACETAMINOPHEN 650 MG RE SUPP: 650.0000 mg | Freq: Four times a day (QID) | RECTAL | Status: DC | PRN

## 2015-07-08 MED ORDER — FENTANYL CITRATE (PF) 100 MCG/2ML IJ SOLN
INTRAMUSCULAR | Status: AC
  Filled 2015-07-08: qty 2

## 2015-07-08 MED ORDER — ONDANSETRON HCL 4 MG/2ML IJ SOLN
INTRAMUSCULAR | Status: DC | PRN
  Administered 2015-07-08: 4 mg via INTRAVENOUS

## 2015-07-08 MED ORDER — MAGNESIUM CITRATE PO SOLN: 1.0000 | Freq: Once | ORAL | Status: DC | PRN

## 2015-07-08 MED ORDER — METOCLOPRAMIDE HCL 5 MG PO TABS: 5.0000 mg | ORAL_TABLET | Freq: Three times a day (TID) | ORAL | Status: DC | PRN

## 2015-07-08 MED ORDER — ROCURONIUM BROMIDE 100 MG/10ML IV SOLN
INTRAVENOUS | Status: DC | PRN
  Administered 2015-07-08: 50 mg via INTRAVENOUS

## 2015-07-08 MED ORDER — ALUM & MAG HYDROXIDE-SIMETH 200-200-20 MG/5ML PO SUSP
30.0000 mL | ORAL | Status: DC | PRN
  Administered 2015-07-09: 30 mL via ORAL
  Filled 2015-07-08 (×2): qty 30

## 2015-07-08 MED ORDER — SENNA 8.6 MG PO TABS
1.0000 | ORAL_TABLET | Freq: Two times a day (BID) | ORAL | Status: DC
  Administered 2015-07-08 – 2015-07-11 (×5): 8.6 mg via ORAL
  Filled 2015-07-08 (×6): qty 1

## 2015-07-08 MED ORDER — BISACODYL 10 MG RE SUPP: 10.0000 mg | Freq: Every day | RECTAL | Status: DC | PRN

## 2015-07-08 MED ORDER — BUPROPION HCL ER (XL) 150 MG PO TB24
150.0000 mg | ORAL_TABLET | Freq: Every day | ORAL | Status: DC
  Administered 2015-07-08 – 2015-07-11 (×4): 150 mg via ORAL
  Filled 2015-07-08 (×4): qty 1

## 2015-07-08 MED ORDER — PROPOFOL 10 MG/ML IV BOLUS
INTRAVENOUS | Status: DC | PRN
  Administered 2015-07-08: 150 mg via INTRAVENOUS

## 2015-07-08 MED ORDER — METOCLOPRAMIDE HCL 5 MG/ML IJ SOLN: 5.0000 mg | Freq: Three times a day (TID) | INTRAMUSCULAR | Status: DC | PRN

## 2015-07-08 MED ORDER — METHOCARBAMOL 1000 MG/10ML IJ SOLN: 500.0000 mg | Freq: Four times a day (QID) | INTRAVENOUS | Status: DC | PRN

## 2015-07-08 MED ORDER — ROCURONIUM BROMIDE 50 MG/5ML IV SOLN
INTRAVENOUS | Status: AC
  Filled 2015-07-08: qty 1

## 2015-07-08 MED ORDER — LOSARTAN POTASSIUM-HCTZ 50-12.5 MG PO TABS: 1.0000 | ORAL_TABLET | Freq: Every day | ORAL | Status: DC

## 2015-07-08 MED ORDER — ADULT MULTIVITAMIN W/MINERALS CH
1.0000 | ORAL_TABLET | Freq: Every day | ORAL | Status: DC
  Administered 2015-07-08 – 2015-07-10 (×3): 1 via ORAL
  Filled 2015-07-08 (×3): qty 1

## 2015-07-08 MED ORDER — THIAMINE HCL 100 MG/ML IJ SOLN
100.0000 mg | Freq: Every day | INTRAMUSCULAR | Status: DC
  Filled 2015-07-08 (×2): qty 2

## 2015-07-08 MED ORDER — VITAMIN B-1 100 MG PO TABS
100.0000 mg | ORAL_TABLET | Freq: Every day | ORAL | Status: DC
  Administered 2015-07-08 – 2015-07-11 (×4): 100 mg via ORAL
  Filled 2015-07-08 (×4): qty 1

## 2015-07-08 MED ORDER — OXYCODONE HCL 5 MG PO TABS
5.0000 mg | ORAL_TABLET | ORAL | Status: DC | PRN
  Administered 2015-07-08 – 2015-07-09 (×5): 10 mg via ORAL
  Administered 2015-07-09: 5 mg via ORAL
  Administered 2015-07-10 – 2015-07-11 (×4): 10 mg via ORAL
  Filled 2015-07-08 (×11): qty 2

## 2015-07-08 MED ORDER — LACTATED RINGERS IV SOLN
INTRAVENOUS | Status: DC | PRN
  Administered 2015-07-08: 07:00:00 via INTRAVENOUS

## 2015-07-08 MED ORDER — PROPOFOL 10 MG/ML IV BOLUS
INTRAVENOUS | Status: AC
  Filled 2015-07-08: qty 20

## 2015-07-08 MED ORDER — DEXAMETHASONE SODIUM PHOSPHATE 10 MG/ML IJ SOLN
10.0000 mg | Freq: Once | INTRAMUSCULAR | Status: AC
  Administered 2015-07-08: 10 mg via INTRAVENOUS
  Filled 2015-07-08: qty 1

## 2015-07-08 MED ORDER — ATORVASTATIN CALCIUM 10 MG PO TABS
10.0000 mg | ORAL_TABLET | Freq: Every day | ORAL | Status: DC
  Administered 2015-07-08 – 2015-07-11 (×5): 10 mg via ORAL
  Filled 2015-07-08 (×4): qty 1

## 2015-07-08 MED ORDER — FOLIC ACID 1 MG PO TABS
1.0000 mg | ORAL_TABLET | Freq: Every day | ORAL | Status: DC
  Administered 2015-07-08 – 2015-07-11 (×4): 1 mg via ORAL
  Filled 2015-07-08 (×4): qty 1

## 2015-07-08 MED ORDER — SENNA-DOCUSATE SODIUM 8.6-50 MG PO TABS: 2.0000 | ORAL_TABLET | Freq: Every day | ORAL | Status: DC

## 2015-07-08 MED ORDER — FUROSEMIDE 40 MG PO TABS
40.0000 mg | ORAL_TABLET | Freq: Every day | ORAL | Status: DC
  Administered 2015-07-08 – 2015-07-11 (×4): 40 mg via ORAL
  Filled 2015-07-08 (×4): qty 1

## 2015-07-08 MED ORDER — HYDROCHLOROTHIAZIDE 12.5 MG PO CAPS
12.5000 mg | ORAL_CAPSULE | Freq: Every day | ORAL | Status: DC
  Administered 2015-07-08 – 2015-07-11 (×4): 12.5 mg via ORAL
  Filled 2015-07-08 (×4): qty 1

## 2015-07-08 MED ORDER — LOSARTAN POTASSIUM 50 MG PO TABS
50.0000 mg | ORAL_TABLET | Freq: Every day | ORAL | Status: DC
  Administered 2015-07-08 – 2015-07-11 (×4): 50 mg via ORAL
  Filled 2015-07-08 (×4): qty 1

## 2015-07-08 MED ORDER — DIPHENHYDRAMINE HCL 12.5 MG/5ML PO ELIX: 12.5000 mg | ORAL_SOLUTION | ORAL | Status: DC | PRN

## 2015-07-08 MED ORDER — POTASSIUM CHLORIDE CRYS ER 20 MEQ PO TBCR
20.0000 meq | EXTENDED_RELEASE_TABLET | Freq: Every day | ORAL | Status: DC
  Administered 2015-07-08 – 2015-07-11 (×4): 20 meq via ORAL
  Filled 2015-07-08 (×4): qty 1

## 2015-07-08 MED ORDER — BUPIVACAINE HCL (PF) 0.25 % IJ SOLN
INTRAMUSCULAR | Status: DC | PRN
  Administered 2015-07-08: 20 mL

## 2015-07-08 MED ORDER — ONDANSETRON HCL 4 MG PO TABS: 4.0000 mg | ORAL_TABLET | Freq: Four times a day (QID) | ORAL | Status: DC | PRN

## 2015-07-08 MED ORDER — SODIUM CHLORIDE 0.9 % IR SOLN
Status: DC | PRN
  Administered 2015-07-08: 1000 mL

## 2015-07-08 MED ORDER — BACLOFEN 10 MG PO TABS: 10.0000 mg | ORAL_TABLET | Freq: Three times a day (TID) | ORAL | Status: DC

## 2015-07-08 MED ORDER — ONDANSETRON HCL 4 MG PO TABS: 4.0000 mg | ORAL_TABLET | Freq: Three times a day (TID) | ORAL | Status: DC | PRN

## 2015-07-08 MED ORDER — PHENOL 1.4 % MT LIQD: 1.0000 | OROMUCOSAL | Status: DC | PRN

## 2015-07-08 MED ORDER — MIDAZOLAM HCL 5 MG/5ML IJ SOLN
INTRAMUSCULAR | Status: DC | PRN
  Administered 2015-07-08: 2 mg via INTRAVENOUS

## 2015-07-08 MED ORDER — LORAZEPAM 1 MG PO TABS: 1.0000 mg | ORAL_TABLET | Freq: Four times a day (QID) | ORAL | Status: AC | PRN

## 2015-07-08 MED ORDER — ONDANSETRON HCL 4 MG/2ML IJ SOLN: 4.0000 mg | Freq: Four times a day (QID) | INTRAMUSCULAR | Status: DC | PRN

## 2015-07-08 MED ORDER — RIVAROXABAN 10 MG PO TABS: 10.0000 mg | ORAL_TABLET | Freq: Every day | ORAL | Status: DC

## 2015-07-08 MED ORDER — HYDROMORPHONE HCL 1 MG/ML IJ SOLN: 0.5000 mg | INTRAMUSCULAR | Status: DC | PRN

## 2015-07-08 MED ORDER — ADULT MULTIVITAMIN W/MINERALS CH
1.0000 | ORAL_TABLET | Freq: Every day | ORAL | Status: DC
  Administered 2015-07-08 – 2015-07-11 (×3): 1 via ORAL
  Filled 2015-07-08 (×4): qty 1

## 2015-07-08 MED ORDER — PHENYLEPHRINE HCL 10 MG/ML IJ SOLN
INTRAMUSCULAR | Status: DC | PRN
  Administered 2015-07-08 (×5): 80 ug via INTRAVENOUS

## 2015-07-08 MED ORDER — CEFAZOLIN SODIUM-DEXTROSE 2-4 GM/100ML-% IV SOLN
2.0000 g | INTRAVENOUS | Status: AC
  Administered 2015-07-08: 2 g via INTRAVENOUS
  Filled 2015-07-08: qty 100

## 2015-07-08 MED ORDER — FENTANYL CITRATE (PF) 250 MCG/5ML IJ SOLN
INTRAMUSCULAR | Status: AC
  Filled 2015-07-08: qty 5

## 2015-07-08 MED ORDER — FENTANYL CITRATE (PF) 100 MCG/2ML IJ SOLN
INTRAMUSCULAR | Status: DC | PRN
  Administered 2015-07-08: 50 ug via INTRAVENOUS
  Administered 2015-07-08 (×2): 100 ug via INTRAVENOUS
  Administered 2015-07-08: 50 ug via INTRAVENOUS
  Administered 2015-07-08: 100 ug via INTRAVENOUS

## 2015-07-08 MED ORDER — PHENYLEPHRINE 40 MCG/ML (10ML) SYRINGE FOR IV PUSH (FOR BLOOD PRESSURE SUPPORT)
PREFILLED_SYRINGE | INTRAVENOUS | Status: AC
  Filled 2015-07-08: qty 10

## 2015-07-08 MED ORDER — MIDAZOLAM HCL 2 MG/2ML IJ SOLN
INTRAMUSCULAR | Status: AC
  Filled 2015-07-08: qty 2

## 2015-07-08 MED ORDER — CEFAZOLIN SODIUM-DEXTROSE 2-4 GM/100ML-% IV SOLN
2.0000 g | Freq: Four times a day (QID) | INTRAVENOUS | Status: AC
  Administered 2015-07-08 (×2): 2 g via INTRAVENOUS
  Filled 2015-07-08 (×2): qty 100

## 2015-07-08 MED ORDER — LIDOCAINE 2% (20 MG/ML) 5 ML SYRINGE
INTRAMUSCULAR | Status: AC
  Filled 2015-07-08: qty 5

## 2015-07-08 MED ORDER — LIDOCAINE HCL (CARDIAC) 20 MG/ML IV SOLN
INTRAVENOUS | Status: DC | PRN
  Administered 2015-07-08: 40 mg via INTRAVENOUS

## 2015-07-08 MED ORDER — ACETAMINOPHEN 325 MG PO TABS: 650.0000 mg | ORAL_TABLET | Freq: Four times a day (QID) | ORAL | Status: DC | PRN

## 2015-07-08 MED ORDER — OXYCODONE HCL 10 MG PO TABS: 10.0000 mg | ORAL_TABLET | Freq: Four times a day (QID) | ORAL | Status: DC | PRN

## 2015-07-08 MED ORDER — POLYETHYLENE GLYCOL 3350 17 G PO PACK: 17.0000 g | PACK | Freq: Every day | ORAL | Status: DC | PRN

## 2015-07-08 MED ORDER — LACTULOSE 10 GM/15ML PO SOLN
20.0000 g | Freq: Two times a day (BID) | ORAL | Status: DC
  Administered 2015-07-08 (×2): 20 g via ORAL
  Filled 2015-07-08 (×6): qty 30

## 2015-07-08 MED ORDER — MENTHOL 3 MG MT LOZG: 1.0000 | LOZENGE | OROMUCOSAL | Status: DC | PRN

## 2015-07-08 MED ORDER — RIVAROXABAN 10 MG PO TABS
10.0000 mg | ORAL_TABLET | Freq: Every day | ORAL | Status: DC
  Administered 2015-07-09 – 2015-07-11 (×3): 10 mg via ORAL
  Filled 2015-07-08 (×3): qty 1

## 2015-07-08 MED ORDER — POTASSIUM CHLORIDE IN NACL 20-0.45 MEQ/L-% IV SOLN
INTRAVENOUS | Status: DC
  Administered 2015-07-08 – 2015-07-09 (×2): via INTRAVENOUS
  Filled 2015-07-08 (×7): qty 1000

## 2015-07-08 MED ORDER — METHOCARBAMOL 500 MG PO TABS
500.0000 mg | ORAL_TABLET | Freq: Four times a day (QID) | ORAL | Status: DC | PRN
  Administered 2015-07-09 – 2015-07-11 (×2): 500 mg via ORAL
  Filled 2015-07-08 (×2): qty 1

## 2015-07-08 MED ORDER — LORAZEPAM 2 MG/ML IJ SOLN: 1.0000 mg | Freq: Four times a day (QID) | INTRAMUSCULAR | Status: AC | PRN

## 2015-07-08 MED ORDER — FENTANYL CITRATE (PF) 100 MCG/2ML IJ SOLN
25.0000 ug | INTRAMUSCULAR | Status: DC | PRN
  Administered 2015-07-08 (×2): 50 ug via INTRAVENOUS

## 2015-07-08 MED ORDER — DOCUSATE SODIUM 100 MG PO CAPS
100.0000 mg | ORAL_CAPSULE | Freq: Two times a day (BID) | ORAL | Status: DC
  Administered 2015-07-08 – 2015-07-10 (×4): 100 mg via ORAL
  Filled 2015-07-08 (×6): qty 1

## 2015-07-08 MED ORDER — SPIRONOLACTONE 25 MG PO TABS
25.0000 mg | ORAL_TABLET | Freq: Every day | ORAL | Status: DC
  Administered 2015-07-08 – 2015-07-11 (×4): 25 mg via ORAL
  Filled 2015-07-08 (×4): qty 1

## 2015-07-08 MED ORDER — ZOLPIDEM TARTRATE 5 MG PO TABS: 5.0000 mg | ORAL_TABLET | Freq: Every evening | ORAL | Status: DC | PRN

## 2015-07-08 MED ORDER — BUPIVACAINE HCL (PF) 0.25 % IJ SOLN
INTRAMUSCULAR | Status: AC
  Filled 2015-07-08: qty 30

## 2015-07-08 MED ORDER — ONDANSETRON HCL 4 MG/2ML IJ SOLN: 4.0000 mg | Freq: Once | INTRAMUSCULAR | Status: DC | PRN

## 2015-07-08 SURGICAL SUPPLY — 63 items
BIT DRILL 5/64X5 DISP (BIT) ×3 IMPLANT
BLADE SAW SAG 73X25 THK (BLADE) ×2
BLADE SAW SGTL 73X25 THK (BLADE) ×1 IMPLANT
BRUSH FEMORAL CANAL (MISCELLANEOUS) IMPLANT
CAPT HIP TOTAL 2 ×2 IMPLANT
CLOSURE STERI-STRIP 1/2X4 (GAUZE/BANDAGES/DRESSINGS)
CLOSURE WOUND 1/2 X4 (GAUZE/BANDAGES/DRESSINGS) ×1
CLSR STERI-STRIP ANTIMIC 1/2X4 (GAUZE/BANDAGES/DRESSINGS) ×2 IMPLANT
COVER SURGICAL LIGHT HANDLE (MISCELLANEOUS) ×3 IMPLANT
DRAPE INCISE IOBAN 66X45 STRL (DRAPES) IMPLANT
DRAPE ORTHO SPLIT 77X108 STRL (DRAPES) ×6
DRAPE PROXIMA HALF (DRAPES) ×3 IMPLANT
DRAPE SURG ORHT 6 SPLT 77X108 (DRAPES) ×2 IMPLANT
DRAPE U-SHAPE 47X51 STRL (DRAPES) ×3 IMPLANT
DRSG MEPILEX BORDER 4X12 (GAUZE/BANDAGES/DRESSINGS) IMPLANT
DRSG MEPILEX BORDER 4X8 (GAUZE/BANDAGES/DRESSINGS) ×2 IMPLANT
DURAPREP 26ML APPLICATOR (WOUND CARE) ×3 IMPLANT
ELECT CAUTERY BLADE 6.4 (BLADE) ×3 IMPLANT
ELECT REM PT RETURN 9FT ADLT (ELECTROSURGICAL) ×3
ELECTRODE REM PT RTRN 9FT ADLT (ELECTROSURGICAL) ×1 IMPLANT
GLOVE BIOGEL PI IND STRL 8 (GLOVE) ×1 IMPLANT
GLOVE BIOGEL PI INDICATOR 8 (GLOVE) ×2
GLOVE BIOGEL PI ORTHO PRO SZ8 (GLOVE) ×2
GLOVE ORTHO TXT STRL SZ7.5 (GLOVE) ×3 IMPLANT
GLOVE PI ORTHO PRO STRL SZ8 (GLOVE) ×1 IMPLANT
GLOVE SURG ORTHO 8.0 STRL STRW (GLOVE) ×3 IMPLANT
GOWN STRL REUS W/ TWL XL LVL3 (GOWN DISPOSABLE) ×1 IMPLANT
GOWN STRL REUS W/TWL 2XL LVL3 (GOWN DISPOSABLE) ×3 IMPLANT
GOWN STRL REUS W/TWL XL LVL3 (GOWN DISPOSABLE) ×3
HANDPIECE INTERPULSE COAX TIP (DISPOSABLE)
HOOD PEEL AWAY FACE SHEILD DIS (HOOD) ×6 IMPLANT
KIT BASIN OR (CUSTOM PROCEDURE TRAY) ×3 IMPLANT
KIT ROOM TURNOVER OR (KITS) ×3 IMPLANT
MANIFOLD NEPTUNE II (INSTRUMENTS) ×3 IMPLANT
NDL SAFETY ECLIPSE 18X1.5 (NEEDLE) ×1 IMPLANT
NDL SUT .5 MAYO 1.404X.05X (NEEDLE) ×1 IMPLANT
NEEDLE HYPO 18GX1.5 SHARP (NEEDLE) ×3
NEEDLE MAYO TAPER (NEEDLE) ×3
NS IRRIG 1000ML POUR BTL (IV SOLUTION) ×3 IMPLANT
PACK TOTAL JOINT (CUSTOM PROCEDURE TRAY) ×3 IMPLANT
PAD ARMBOARD 7.5X6 YLW CONV (MISCELLANEOUS) ×6 IMPLANT
PILLOW ABDUCTION HIP (SOFTGOODS) ×3 IMPLANT
PRESSURIZER FEMORAL UNIV (MISCELLANEOUS) IMPLANT
RETRIEVER SUT HEWSON (MISCELLANEOUS) ×3 IMPLANT
SET HNDPC FAN SPRY TIP SCT (DISPOSABLE) IMPLANT
SPONGE LAP 4X18 X RAY DECT (DISPOSABLE) IMPLANT
STRIP CLOSURE SKIN 1/2X4 (GAUZE/BANDAGES/DRESSINGS) ×1 IMPLANT
SUCTION FRAZIER HANDLE 10FR (MISCELLANEOUS) ×2
SUCTION TUBE FRAZIER 10FR DISP (MISCELLANEOUS) ×1 IMPLANT
SUT FIBERWIRE #2 38 REV NDL BL (SUTURE) ×9
SUT MNCRL AB 4-0 PS2 18 (SUTURE) ×3 IMPLANT
SUT VIC AB 0 CT1 27 (SUTURE) ×3
SUT VIC AB 0 CT1 27XBRD ANBCTR (SUTURE) ×1 IMPLANT
SUT VIC AB 2-0 CT1 27 (SUTURE) ×3
SUT VIC AB 2-0 CT1 TAPERPNT 27 (SUTURE) ×1 IMPLANT
SUT VIC AB 3-0 SH 8-18 (SUTURE) ×3 IMPLANT
SUTURE FIBERWR#2 38 REV NDL BL (SUTURE) ×3 IMPLANT
SYR CONTROL 10ML LL (SYRINGE) ×3 IMPLANT
TOWEL OR 17X24 6PK STRL BLUE (TOWEL DISPOSABLE) ×3 IMPLANT
TOWEL OR 17X26 10 PK STRL BLUE (TOWEL DISPOSABLE) ×3 IMPLANT
TOWER CARTRIDGE SMART MIX (DISPOSABLE) IMPLANT
TRAY FOLEY CATH 14FR (SET/KITS/TRAYS/PACK) IMPLANT
WATER STERILE IRR 1000ML POUR (IV SOLUTION) ×2 IMPLANT

## 2015-07-08 NOTE — Anesthesia Preprocedure Evaluation (Signed)
Anesthesia Evaluation  Patient identified by MRN, date of birth, ID band Patient awake    Reviewed: Allergy & Precautions, NPO status , Patient's Chart, lab work & pertinent test results  Airway Mallampati: II  TM Distance: >3 FB Neck ROM: Full    Dental  (+) Teeth Intact, Dental Advisory Given   Pulmonary    breath sounds clear to auscultation       Cardiovascular hypertension,  Rhythm:Regular Rate:Normal     Neuro/Psych    GI/Hepatic   Endo/Other    Renal/GU      Musculoskeletal   Abdominal   Peds  Hematology   Anesthesia Other Findings   Reproductive/Obstetrics                            Anesthesia Physical Anesthesia Plan  ASA: III  Anesthesia Plan: General   Post-op Pain Management:    Induction: Intravenous  Airway Management Planned: Oral ETT  Additional Equipment:   Intra-op Plan:   Post-operative Plan:   Informed Consent: I have reviewed the patients History and Physical, chart, labs and discussed the procedure including the risks, benefits and alternatives for the proposed anesthesia with the patient or authorized representative who has indicated his/her understanding and acceptance.   Dental advisory given  Plan Discussed with: CRNA and Anesthesiologist  Anesthesia Plan Comments:         Anesthesia Quick Evaluation  

## 2015-07-08 NOTE — Procedures (Signed)
Pt states that he has not used his CPAP machine in several months.  PT states that he does not like the feel of the mask or pressure due to prior nasal surgery.  Pt states that he just wants to use the oxygen.  RT set up 2L [place] for pt use and comfort.  RT pulled CPAP machine but instructed pt to notify if he want to begin CPAP use and RT will comply.

## 2015-07-08 NOTE — Anesthesia Postprocedure Evaluation (Signed)
Anesthesia Post Note  Patient: Lawrence West  Procedure(s) Performed: Procedure(s) (LRB): LEFT TOTAL HIP ARTHROPLASTY (Left)  Patient location during evaluation: PACU Anesthesia Type: General and Regional Level of consciousness: awake, awake and alert, oriented and sedated Pain management: pain level controlled Vital Signs Assessment: post-procedure vital signs reviewed and stable Respiratory status: spontaneous breathing, nonlabored ventilation and respiratory function stable Anesthetic complications: no    Last Vitals:  Filed Vitals:   07/08/15 1111 07/08/15 1122  BP: 131/75 119/77  Pulse: 85 84  Temp: 36.7 C 36.8 C  Resp: 16 14    Last Pain:  Filed Vitals:   07/08/15 1123  PainSc: Asleep                 Lawrence West,Lawrence West

## 2015-07-08 NOTE — Anesthesia Procedure Notes (Signed)
Procedure Name: Intubation Date/Time: 07/08/2015 7:39 AM Performed by: Gavin PoundLOWDER, Reinaldo Helt J Pre-anesthesia Checklist: Patient identified, Timeout performed, Emergency Drugs available, Suction available and Patient being monitored Patient Re-evaluated:Patient Re-evaluated prior to inductionOxygen Delivery Method: Circle system utilized Preoxygenation: Pre-oxygenation with 100% oxygen Intubation Type: IV induction Ventilation: Mask ventilation without difficulty Laryngoscope Size: Mac and 4 Grade View: Grade I Tube type: Oral Tube size: 7.0 mm Number of attempts: 1 Placement Confirmation: ETT inserted through vocal cords under direct vision,  breath sounds checked- equal and bilateral and positive ETCO2 Secured at: 21 cm Tube secured with: Tape Dental Injury: Teeth and Oropharynx as per pre-operative assessment

## 2015-07-08 NOTE — Progress Notes (Signed)
Utilization review completed.  

## 2015-07-08 NOTE — Evaluation (Signed)
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 Pt is now s/p L THA posterior approach on 07/08/15.  Clinical Impression  This patient presents with acute pain and decreased functional independence following the above mentioned procedure. At the time of PT eval, pt was able to perform transfers and ambulation with RW and min guard to min assist for safety and balance support. Pt lives alone at this time and will not have any support at d/c. Pt planning on short term rehab at the SNF level prior to return home. This patient is appropriate for skilled PT interventions to address functional limitations, improve safety and independence with functional mobility, and return to PLOF.     Follow Up Recommendations SNF;Supervision/Assistance - 24 hour    Equipment Recommendations  None recommended by PT (TBD by next venue of care)    Recommendations for Other Services       Precautions / Restrictions Precautions Precautions: Fall;Posterior Hip Precaution Booklet Issued: Yes (comment) Precaution Comments: Reviewed hip precaution handout and issued THA HEP. Restrictions Weight Bearing Restrictions: Yes LLE Weight Bearing: Weight bearing as tolerated      Mobility  Bed Mobility Overal bed mobility: Needs Assistance Bed Mobility: Supine to Sit     Supine to sit: Min assist     General bed mobility comments: Assist for advancement of LE's to EOB. Pt required increased time and heavy use of rails to elevate trunk into full sitting position and complete scooting so feet were resting on the floor.   Transfers Overall transfer level: Needs assistance Equipment used: Rolling walker (2 wheeled) Transfers: Sit to/from Stand Sit to Stand: Min assist;From elevated surface         General transfer comment: Bed height raised for assist. Pt was able  to power-up to full stand with min assist.  Ambulation/Gait Ambulation/Gait assistance: Min guard Ambulation Distance (Feet): 5 Feet Assistive device: Rolling walker (2 wheeled) Gait Pattern/deviations: Step-to pattern;Decreased stride length;Trunk flexed Gait velocity: Decreased Gait velocity interpretation: Below normal speed for age/gender General Gait Details: Pt was able to take pivotal steps around to the recliner chair. VC's for general sequencing and safety awareness.   Stairs            Wheelchair Mobility    Modified Rankin (Stroke Patients Only)       Balance Overall balance assessment: Needs assistance Sitting-balance support: Feet supported;No upper extremity supported Sitting balance-Leahy Scale: Fair     Standing balance support: Bilateral upper extremity supported;During functional activity Standing balance-Leahy Scale: Poor Standing balance comment: Reliant on RW for UE support.                              Pertinent Vitals/Pain Pain Assessment: Faces Faces Pain Scale: Hurts even more Pain Location: L hip Pain Descriptors / Indicators: Operative site guarding;Sore Pain Intervention(s): Limited activity within patient's tolerance;Monitored during session;Repositioned;RN gave pain meds during session    Home Living Family/patient expects to be discharged to:: Skilled nursing facility Living Arrangements: Alone               Additional Comments: Pt reports it is already set up for him to go to SNF at d/c due to living alone and with multiple stairs at his home.     Prior Function Level of  Independence: Independent               Hand Dominance   Dominant Hand: Right    Extremity/Trunk Assessment   Upper Extremity Assessment: Defer to OT evaluation           Lower Extremity Assessment: LLE deficits/detail   LLE Deficits / Details: Decreased strength and AROM consistent with above mentioned procedure.   Cervical /  Trunk Assessment: Normal  Communication   Communication: No difficulties  Cognition Arousal/Alertness: Awake/alert Behavior During Therapy: WFL for tasks assessed/performed Overall Cognitive Status: Within Functional Limits for tasks assessed                      General Comments      Exercises Total Joint Exercises Ankle Circles/Pumps: 20 reps Quad Sets: 15 reps      Assessment/Plan    PT Assessment Patient needs continued PT services  PT Diagnosis Difficulty walking;Acute pain   PT Problem List Decreased strength;Decreased range of motion;Decreased activity tolerance;Decreased balance;Decreased mobility;Decreased knowledge of use of DME;Decreased safety awareness;Decreased knowledge of precautions;Pain  PT Treatment Interventions DME instruction;Gait training;Stair training;Functional mobility training;Therapeutic activities;Therapeutic exercise;Neuromuscular re-education;Patient/family education   PT Goals (Current goals can be found in the Care Plan section) Acute Rehab PT Goals Patient Stated Goal: Home after SNF PT Goal Formulation: With patient Time For Goal Achievement: 07/22/15 Potential to Achieve Goals: Good    Frequency 7X/week   Barriers to discharge Decreased caregiver support Pt lives alone    Co-evaluation               End of Session Equipment Utilized During Treatment: Gait belt Activity Tolerance: Patient limited by pain Patient left: in chair;with call bell/phone within reach;with chair alarm set Nurse Communication: Mobility status         Time: 9604-5409 PT Time Calculation (min) (ACUTE ONLY): 39 min   Charges:   PT Evaluation $PT Eval Moderate Complexity: 1 Procedure PT Treatments $Gait Training: 8-22 mins $Therapeutic Exercise: 8-22 mins   PT G Codes:        Conni Slipper 07-29-2015, 2:38 PM  Conni Slipper, PT, DPT Acute Rehabilitation Services Pager: [phone]

## 2015-07-08 NOTE — Op Note (Signed)
07/08/2015  9:23 AM  PATIENT:  Lawrence West   MRN: [id number]  PRE-OPERATIVE DIAGNOSIS:  Avascular necrosis of bone of left hip ([place])  POST-OPERATIVE DIAGNOSIS:  Avascular necrosis of bone of left hip (HCC)  PROCEDURE:  Procedure(s): LEFT TOTAL HIP ARTHROPLASTY  PREOPERATIVE INDICATIONS:    Lawrence West is an 70 y.o. male who has a diagnosis of Avascular necrosis of bone of left hip ([place]) and elected for surgical management after failing conservative treatment.  The risks benefits and alternatives were discussed with the patient including but not limited to the risks of nonoperative treatment, versus surgical intervention including infection, bleeding, nerve injury, periprosthetic fracture, the need for revision surgery, dislocation, leg length discrepancy, blood clots, cardiopulmonary complications, morbidity, mortality, among others, and they were willing to proceed.     OPERATIVE REPORT     SURGEON:  Marchia Bond, MD    ASSISTANT:  Joya Gaskins, OPA-C  (Present throughout the entire procedure,  necessary for completion of procedure in a timely manner, assisting with retraction, instrumentation, and closure)     ANESTHESIA:  Gen.    COMPLICATIONS:  None.     UNIQUE ASPECTS OF THE CASE:  There was substantial amount of bone loss on the femoral head, with collapse, the cup itself was fairly large, and the bone quality was overall quite poor. There were significant acetabular cystic changes superiorly, which I did bone graft, with some of the reamings from the acetabulum.  I did restore length completely, and had excellent soft tissue coverage and repair posteriorly at the completion of the case. He was stable, and did not require a posterior lip liner. He was somewhat short, and I did pot a size 7 slightly proud in order to restore length.  COMPONENTS:  [employer] fit femur size 7 with a 36 mm + 1.5 head ball and a gription acetabular shell size 58 with an apex hole  eliminator and a 10 degree +4 lipped polyethylene liner.    PROCEDURE IN DETAIL:   The patient was met in the holding area and  identified.  The appropriate hip was identified and marked at the operative site.  The patient was then transported to the OR  and  placed under anesthesia.  At that point, the patient was  placed in the lateral decubitus position with the operative side up and  secured to the operating room table and all bony prominences padded.     The operative lower extremity was prepped from the iliac crest to the distal leg.  Sterile draping was performed.  Time out was performed prior to incision.      A routine posterolateral approach was utilized via sharp dissection  carried down to the subcutaneous tissue.  Gross bleeders were Bovie coagulated.  The iliotibial band was identified and incised along the length of the skin incision.  Self-retaining retractors were  inserted.  With the hip internally rotated, the short external rotators  were identified. The piriformis and capsule was tagged with FiberWire, and the hip capsule released in a T-type fashion.  The femoral neck was exposed, and I resected the femoral neck using the appropriate jig. This was performed at approximately a thumb's breadth above the lesser trochanter.    I then exposed the deep acetabulum, cleared out any tissue including the ligamentum teres.  A wing retractor was placed.  After adequate visualization, I excised the labrum, and then sequentially reamed.  I placed the trial acetabulum, which seated nicely, and  then impacted the real cup into place.  Appropriate version and inclination was confirmed clinically matching their bony anatomy, and also with the use of the jig.  A trial polyethylene liner was placed and the wing retractor removed.    I then prepared the proximal femur using the cookie-cutter, the lateralizing reamer, and then sequentially reamed and broached.  A trial broach, neck, and head was  utilized, and I reduced the hip and it was found to have excellent stability with functional range of motion. The trial components were then removed, and the real polyethylene liner was placed.  I then impacted the real femoral prosthesis into place into the appropriate version, slightly anteverted to the normal anatomy, and I impacted the real head ball into place. The hip was then reduced and taken through functional range of motion and found to have excellent stability. Leg lengths were restored.  I then used a 2 mm drill bits to pass the FiberWire suture from the capsule and piriformis through the greater trochanter, and secured this. Excellent posterior capsular repair was achieved. I also closed the T in the capsule.  I then irrigated the hip copiously again with pulse lavage, and repaired the fascia with Vicryl, followed by Vicryl for the subcutaneous tissue, Monocryl for the skin, Steri-Strips and sterile gauze. The wounds were injected. The patient was then awakened and returned to PACU in stable and satisfactory condition. There were no complications.  Marchia Bond, MD Orthopedic Surgeon [phone]   07/08/2015 9:23 AM

## 2015-07-08 NOTE — Transfer of Care (Signed)
Immediate Anesthesia Transfer of Care Note  Patient: Lawrence BalesDavid H Pegg  Procedure(s) Performed: Procedure(s): LEFT TOTAL HIP ARTHROPLASTY (Left)  Patient Location: PACU  Anesthesia Type:General  Level of Consciousness: awake  Airway & Oxygen Therapy: Patient Spontanous Breathing and Patient connected to nasal cannula oxygen  Post-op Assessment: Report given to RN and Post -op Vital signs reviewed and stable  Post vital signs: Reviewed and stable  Last Vitals:  Filed Vitals:   07/08/15 0654 07/08/15 0955  BP: 142/84 133/94  Pulse: 82 85  Temp: 37.1 C 36.7 C  Resp: 20 13    Last Pain: There were no vitals filed for this visit.    Patients Stated Pain Goal: 0 (07/08/15 0955)  Complications: No apparent anesthesia complications

## 2015-07-08 NOTE — H&P (Signed)
PREOPERATIVE H&P  Chief Complaint: left hip AVN  HPI: Lawrence West is a 70 y.o. male who presents for preoperative history and physical with a diagnosis of left hip AVN. Symptoms are rated as moderate to severe, and have been worsening.  This is significantly impairing activities of daily living.  He has elected for surgical management.   He has failed activity modification, anti-inflammatories, and assistive devices.  Preoperative X-rays demonstrate extensive femoral head collapse with loss of at least 50% of the circumference and substantial volume.   Past Medical History  Diagnosis Date  . Cirrhosis of liver (HCC)     Sees Dr. Sung Amabileari Richards  . Ascites   . Hypertension   . Arthritis   . Gout   . Narcolepsy   . Sleep apnea     pt stated "I have sleep apnea but I do not use the machine...Marland Kitchen.Marland Kitchen.Marland Kitchen.it broke"  . Depression   . Edema of both legs     Takes Lasix  . Rheumatic fever    Past Surgical History  Procedure Laterality Date  . Cosmetic surgery    . Hernia repair    . Joint replacement      left knee  . [place] guided paracentesis ([facility] hx)  April 2017   Social History   Social History  . Marital Status: Single    Spouse Name: N/A  . Number of Children: N/A  . Years of Education: N/A   Social History Main Topics  . Smoking status: Never Smoker   . Smokeless tobacco: Never Used  . Alcohol Use: No     Comment: former Heavy ETOH abuse.   . Drug Use: No  . Sexual Activity: Not Asked   Other Topics Concern  . None   Social History Narrative   Family History  Problem Relation Age of Onset  . Melanoma Father   . Lung cancer Father    No Known Allergies Prior to Admission medications   Medication Sig Start Date End Date Taking? Authorizing Provider  aspirin EC 81 MG tablet Take 1 tablet (81 mg total) by mouth daily. 05/20/15  Yes Enid Baasadhika Kalisetti, MD  atorvastatin (LIPITOR) 10 MG tablet Take 1 tablet (10 mg total) by mouth daily. 05/20/15  Yes Enid Baasadhika Kalisetti, MD   buPROPion (WELLBUTRIN XL) 150 MG 24 hr tablet Take 1 tablet (150 mg total) by mouth daily. 05/20/15  Yes Enid Baasadhika Kalisetti, MD  furosemide (LASIX) 40 MG tablet Take 1 tablet (40 mg total) by mouth daily. 05/20/15  Yes Enid Baasadhika Kalisetti, MD  Lactulose 20 GM/30ML SOLN Take 30 mLs (20 g total) by mouth 2 (two) times daily. HOLD FOR > 3 bowel movements /day 05/20/15  Yes Enid Baasadhika Kalisetti, MD  losartan-hydrochlorothiazide (HYZAAR) 50-12.5 MG tablet Take 1 tablet by mouth daily. 05/20/15  Yes Enid Baasadhika Kalisetti, MD  meloxicam (MOBIC) 15 MG tablet Take 1 tablet (15 mg total) by mouth daily as needed for pain. Patient taking differently: Take 15 mg by mouth daily.  05/20/15  Yes Enid Baasadhika Kalisetti, MD  Multiple Vitamin (MULTIVITAMIN WITH MINERALS) TABS tablet Take 1 tablet by mouth daily.   Yes Historical Provider, MD  potassium chloride SA (K-DUR,KLOR-CON) 20 MEQ tablet Take 1 tablet (20 mEq total) by mouth daily. 05/20/15  Yes Enid Baasadhika Kalisetti, MD  spironolactone (ALDACTONE) 25 MG tablet Take 1 tablet (25 mg total) by mouth daily. 05/20/15  Yes Enid Baasadhika Kalisetti, MD     Positive ROS: All other systems have been reviewed and were otherwise negative with the  exception of those mentioned in the HPI and as above.  Physical Exam: General: Alert, no acute distress Cardiovascular: No pedal edema Respiratory: No cyanosis, no use of accessory musculature GI: No organomegaly, abdomen is soft and non-tender Skin: No lesions in the area of chief complaint Neurologic: Sensation intact distally Psychiatric: Patient is competent for consent with normal mood and affect Lymphatic: No axillary or cervical lymphadenopathy  MUSCULOSKELETAL: Left hip has motion 0-90, 10 of internal rotation and 20 of external rotation, left leg shorter than the right. EHL and FHL are intact.  Assessment: Left hip avascular necrosis, history of substantial alcohol use.   Plan: Plan for Procedure(s): LEFT TOTAL HIP  ARTHROPLASTY  The risks benefits and alternatives were discussed with the patient including but not limited to the risks of nonoperative treatment, versus surgical intervention including infection, bleeding, nerve injury, periprosthetic fracture, the need for revision surgery, dislocation, leg length discrepancy, blood clots, cardiopulmonary complications, morbidity, mortality, among others, and they were willing to proceed.     Eulas Post, MD Cell [phone]   07/08/2015 7:11 AM

## 2015-07-08 NOTE — Progress Notes (Signed)
RT Note: Pt seen at this time due to respiratory consult order. Pt states that he does have Cpap at home but it has been broken and not used X665months.  Pt states that he will try to wear tonight.  Will bring Cpap machine to room and set up per order.

## 2015-07-08 NOTE — NC FL2 (Signed)
[place] MEDICAID FL2 LEVEL OF CARE SCREENING TOOL     IDENTIFICATION  Patient Name: Lawrence West Birthdate: [birth year] Sex: male Admission Date (Current Location): 07/08/2015  [facility] and [place] Number:  Producer, television/film/video and Address:  [facility] [place], [address] [place], [place] [address]      Provider Number: [id number]  Attending Physician Name and Address:  Teryl Lucy, MD  Relative Name and Phone Number:       Current Level of Care: Hospital Recommended Level of Care: Skilled Nursing Facility Prior Approval Number:    Date Approved/Denied:   PASRR Number:    Discharge Plan: SNF    Current Diagnoses: Patient Active Problem List   Diagnosis Date Noted  . Avascular necrosis of bone of left hip (HCC) 07/08/2015  . S/P total hip arthroplasty 07/08/2015  . Ascites due to alcoholic cirrhosis (HCC) 05/19/2015    Orientation RESPIRATION BLADDER Height & Weight     Self, Time, Situation, Place  Normal, Other (Comment) (CPAP, but states his has been broken for 5 months and no longer uses it.) Continent Weight: 201 lb 8 oz (91.4 kg) Height:  5\' 10"  (177.8 cm)  BEHAVIORAL SYMPTOMS/MOOD NEUROLOGICAL BOWEL NUTRITION STATUS      Continent Diet (Please see discharge summary.)  AMBULATORY STATUS COMMUNICATION OF NEEDS Skin   Limited Assist Verbally Surgical wounds                       Personal Care Assistance Level of Assistance  Bathing, Feeding, Dressing Bathing Assistance: Limited assistance Feeding assistance: Limited assistance Dressing Assistance: Limited assistance     Functional Limitations Info             SPECIAL CARE FACTORS FREQUENCY  PT (By licensed PT), OT (By licensed OT)     PT Frequency: 5 OT Frequency: 5            Contractures      Additional Factors Info  Code Status, Allergies Code Status Info: FULL Allergies Info: No known allergies           Current Medications (07/08/2015):  This is  the current hospital active medication list Current Facility-Administered Medications  Medication Dose Route Frequency Provider Last Rate Last Dose  . 0.45 % NaCl with KCl 20 mEq / L infusion   Intravenous Continuous Teryl Lucy, MD 75 mL/hr at 07/08/15 1353    . acetaminophen (TYLENOL) tablet 650 mg  650 mg Oral Q6H PRN Teryl Lucy, MD       Or  . acetaminophen (TYLENOL) suppository 650 mg  650 mg Rectal Q6H PRN Teryl Lucy, MD      . alum & mag hydroxide-simeth (MAALOX/MYLANTA) 200-200-20 MG/5ML suspension 30 mL  30 mL Oral Q4H PRN Teryl Lucy, MD      . atorvastatin (LIPITOR) tablet 10 mg  10 mg Oral Daily Teryl Lucy, MD      . bisacodyl (DULCOLAX) suppository 10 mg  10 mg Rectal Daily PRN Teryl Lucy, MD      . buPROPion (WELLBUTRIN XL) 24 hr tablet 150 mg  150 mg Oral Daily Teryl Lucy, MD      . ceFAZolin (ANCEF) IVPB 2g/100 mL premix  2 g Intravenous Q6H Teryl Lucy, MD   2 g at 07/08/15 1353  . [START ON 07/09/2015] dexamethasone (DECADRON) injection 10 mg  10 mg Intravenous Once Teryl Lucy, MD      . diphenhydrAMINE (BENADRYL) 12.5 MG/5ML elixir  12.5-25 mg  12.5-25 mg Oral Q4H PRN Teryl LucyJoshua Yazleemar Strassner, MD      . docusate sodium (COLACE) capsule 100 mg  100 mg Oral BID Teryl LucyJoshua Valerian Jewel, MD      . fentaNYL (SUBLIMAZE) 100 MCG/2ML injection           . folic acid (FOLVITE) tablet 1 mg  1 mg Oral Daily Teryl LucyJoshua Kierstin January, MD      . furosemide (LASIX) tablet 40 mg  40 mg Oral Daily Teryl LucyJoshua Landri Dorsainvil, MD      . hydrochlorothiazide (MICROZIDE) capsule 12.5 mg  12.5 mg Oral Daily Renaee MundaKendra P Hiatt, RPH      . HYDROmorphone (DILAUDID) injection 0.5 mg  0.5 mg Intravenous Q2H PRN Teryl LucyJoshua Shaniece Bussa, MD      . lactulose (CHRONULAC) 10 GM/15ML solution 20 g  20 g Oral BID Teryl LucyJoshua Tam Delisle, MD      . LORazepam (ATIVAN) tablet 1 mg  1 mg Oral Q6H PRN Teryl LucyJoshua Maybree Riling, MD       Or  . LORazepam (ATIVAN) injection 1 mg  1 mg Intravenous Q6H PRN Teryl LucyJoshua Beckett Hickmon, MD      . losartan (COZAAR) tablet 50 mg  50 mg Oral  Daily Kendra P Hiatt, RPH      . magnesium citrate solution 1 Bottle  1 Bottle Oral Once PRN Teryl LucyJoshua Jamir Rone, MD      . menthol-cetylpyridinium (CEPACOL) lozenge 3 mg  1 lozenge Oral PRN Teryl LucyJoshua Ashaz Robling, MD       Or  . phenol (CHLORASEPTIC) mouth spray 1 spray  1 spray Mouth/Throat PRN Teryl LucyJoshua Eulan Heyward, MD      . methocarbamol (ROBAXIN) tablet 500 mg  500 mg Oral Q6H PRN Teryl LucyJoshua Rockey Guarino, MD       Or  . methocarbamol (ROBAXIN) 500 mg in dextrose 5 % 50 mL IVPB  500 mg Intravenous Q6H PRN Teryl LucyJoshua Delanie Tirrell, MD      . metoCLOPramide (REGLAN) tablet 5-10 mg  5-10 mg Oral Q8H PRN Teryl LucyJoshua Chrysta Fulcher, MD       Or  . metoCLOPramide (REGLAN) injection 5-10 mg  5-10 mg Intravenous Q8H PRN Teryl LucyJoshua Latisa Belay, MD      . multivitamin with minerals tablet 1 tablet  1 tablet Oral Daily Teryl LucyJoshua Shannyn Jankowiak, MD      . multivitamin with minerals tablet 1 tablet  1 tablet Oral Daily Teryl LucyJoshua Cynthis Purington, MD      . ondansetron [facility](ZOFRAN) tablet 4 mg  4 mg Oral Q6H PRN Teryl LucyJoshua Darnise Montag, MD       Or  . ondansetron [facility](ZOFRAN) injection 4 mg  4 mg Intravenous Q6H PRN Teryl LucyJoshua Joal Eakle, MD      . oxyCODONE (Oxy IR/ROXICODONE) immediate release tablet 5-10 mg  5-10 mg Oral Q3H PRN Teryl LucyJoshua Mayrani Khamis, MD   10 mg at 07/08/15 1351  . polyethylene glycol (MIRALAX / GLYCOLAX) packet 17 g  17 g Oral Daily PRN Teryl LucyJoshua Caylan Schifano, MD      . potassium chloride SA (K-DUR,KLOR-CON) CR tablet 20 mEq  20 mEq Oral Daily Teryl LucyJoshua Delray Reza, MD      . Melene Muller[START ON 07/09/2015] rivaroxaban (XARELTO) tablet 10 mg  10 mg Oral Q breakfast Teryl LucyJoshua Huldah Marin, MD      . senna [facility](SENOKOT) tablet 8.6 mg  1 tablet Oral BID Teryl LucyJoshua Jobie Popp, MD      . spironolactone (ALDACTONE) tablet 25 mg  25 mg Oral Daily Teryl LucyJoshua Shira Bobst, MD      . thiamine (VITAMIN B-1) tablet 100 mg  100 mg Oral Daily Teryl LucyJoshua Zyrion Coey, MD  Or  . thiamine (B-1) injection 100 mg  100 mg Intravenous Daily Teryl Lucy, MD      . zolpidem (AMBIEN) tablet 5 mg  5 mg Oral QHS PRN Teryl Lucy, MD         Discharge Medications: Please see  discharge summary for a list of discharge medications.  Relevant Imaging Results:  Relevant Lab Results:   Additional Information SSN: [id number]  Rod Mae, [name]

## 2015-07-09 LAB — CBC
HCT: 26.3 % — ABNORMAL LOW (ref 39.0–52.0)
Hemoglobin: 8.6 g/dL — ABNORMAL LOW (ref 13.0–17.0)
MCH: 30.8 pg (ref 26.0–34.0)
MCHC: 32.7 g/dL (ref 30.0–36.0)
MCV: 94.3 fL (ref 78.0–100.0)
PLATELETS: 146 10*3/uL — AB (ref 150–400)
RBC: 2.79 MIL/uL — ABNORMAL LOW (ref 4.22–5.81)
RDW: 15.5 % (ref 11.5–15.5)
WBC: 24.4 10*3/uL — ABNORMAL HIGH (ref 4.0–10.5)

## 2015-07-09 LAB — BASIC METABOLIC PANEL
Anion gap: 6 (ref 5–15)
BUN: 15 mg/dL (ref 6–20)
CALCIUM: 8.4 mg/dL — AB (ref 8.9–10.3)
CO2: 23 mmol/L (ref 22–32)
CREATININE: 1.19 mg/dL (ref 0.61–1.24)
Chloride: 104 mmol/L (ref 101–111)
GFR calc Af Amer: >60 mL/min (ref >60)
GLUCOSE: 140 mg/dL — AB (ref 65–99)
Potassium: 4.4 mmol/L (ref 3.5–5.1)
SODIUM: 133 mmol/L — AB (ref 135–145)

## 2015-07-09 NOTE — Care Management Important Message (Signed)
Important Message  Patient Details  Name: Lawrence West MRN: [id number] Date of Birth: 12/28/1945   Medicare Important Message Given:  Yes    Bernadette HoitShoffner, Norlan Rann Coleman 07/09/2015, 9:24 AM

## 2015-07-09 NOTE — Clinical Social Work Note (Signed)
PASARR: [phone]   Najat Olazabal, [name] [phone] Orthopedics: [phone] Surgical: [phone]

## 2015-07-09 NOTE — Clinical Social Work Note (Signed)
Clinical Social Work Assessment  Patient Details  Name: Lawrence West MRN: [id number] Date of Birth: 1945/09/27  Date of referral:  07/09/15               Reason for consult:  Discharge Planning                Permission sought to share information with:  Chartered certified accountant granted to share information::  Yes, Verbal Permission Granted  Name::        Agency::  [facility]  Relationship::     Contact Information:     Housing/Transportation Living arrangements for the past 2 months:  Single Family Home Source of Information:  Patient Patient Interpreter Needed:  None Criminal Activity/Legal Involvement Pertinent to Current Situation/Hospitalization:  No - Comment as needed Significant Relationships:  None Lives with:  Self Do you feel safe going back to the place where you live?  No Need for family participation in patient care:  No (Coment) (Patient able to make own decisions.)  Care giving concerns:  Patient expressed no concerns at this time.   Social Worker assessment / plan:  [name] consulted regarding possible SNF placement at time of discharge. [name] met with patient at bedside who informed [name] patient to be discharged to [facility] once medically stable. [name] confirmed discharge plan with [facility] admissions liaison. Per patient, patient lives alone and will require EMS transportation at time of discharge. [name] to continue to follow and assist with discharge planning needs.  Employment status:  Other (Comment) (Did not disclose.) Insurance information:  Managed Medicare ([employer] Medicare ([place])) PT Recommendations:  [place] / Referral to community resources:  [place]  Patient/Family's Response to care:  Patient understanding and agreeable to [name] plan of care.  Patient/Family's Understanding of and Emotional Response to Diagnosis, Current Treatment, and Prognosis:  Patient understanding and  agreeable to [name] plan of care.  Emotional Assessment Appearance:  Appears stated age Attitude/Demeanor/Rapport:  Other (Appropriate) Affect (typically observed):  Accepting, Appropriate, Pleasant Orientation:  Oriented to Self, Oriented to Place, Oriented to  Time, Oriented to Situation Alcohol / Substance use:  Not Applicable Psych involvement (Current and /or in the community):  No (Comment) (Not appropriate on this admission.)  Discharge Needs  Concerns to be addressed:  No discharge needs identified Readmission within the last 30 days:  No Current discharge risk:  None Barriers to Discharge:  No Barriers Identified   Caroline Sauger, [name] 07/09/2015, 10:41 AM [phone]

## 2015-07-09 NOTE — Progress Notes (Signed)
Patient ID: Jorja LoaDavid H Glick, male   DOB: 11/11/1945, 70 y.o.   MRN: [id number]     Subjective:  Patient reports pain as mild.  Patient denies any CP or SOB. In the chair and in no acute distress.  Objective:   VITALS:   Filed Vitals:   07/08/15 1122 07/08/15 2009 07/09/15 0121 07/09/15 0518  BP: 119/77 121/76 114/68 107/63  Pulse: 84 107 106 96  Temp: 98.3 F (36.8 C) 99.7 F (37.6 C) 99.1 F (37.3 C) 98.7 F (37.1 C)  TempSrc: Oral Oral Oral Oral  Resp: 14 16 15 16   Height:      Weight:      SpO2: 96% 95% 95% 98%    ABD soft Sensation intact distally Dorsiflexion/Plantar flexion intact Incision: dressing C/D/I and scant drainage   Lab Results  Component Value Date   WBC 24.4* 07/09/2015   HGB 8.6* 07/09/2015   HCT 26.3* 07/09/2015   MCV 94.3 07/09/2015   PLT 146* 07/09/2015   BMET    Component Value Date/Time   NA 133* 07/09/2015 0526   NA 138 10/23/2013 1338   K 4.4 07/09/2015 0526   K 3.6 10/23/2013 1338   CL 104 07/09/2015 0526   CL 104 10/23/2013 1338   CO2 23 07/09/2015 0526   CO2 24 10/23/2013 1338   GLUCOSE 140* 07/09/2015 0526   GLUCOSE 157* 10/23/2013 1338   BUN 15 07/09/2015 0526   BUN 8 10/23/2013 1338   CREATININE 1.19 07/09/2015 0526   CREATININE 0.95 10/23/2013 1338   CALCIUM 8.4* 07/09/2015 0526   CALCIUM 9.4 10/23/2013 1338   GFRNONAA >60 07/09/2015 0526   GFRNONAA >60 10/23/2013 1338   GFRAA >60 07/09/2015 0526   GFRAA >60 10/23/2013 1338     Assessment/Plan: 1 Day Post-Op   Principal Problem:   Avascular necrosis of bone of left hip (HCC) Active Problems:   S/P total hip arthroplasty   Advance diet Up with therapy WBAT  Dry dressing PRN Planning SNF on Thurs. Or Fri.   Haskel KhanDOUGLAS PARRY, BRANDON 07/09/2015, 7:55 AM  Discussed and agree with above.     Teryl LucyJoshua Mavi Un, MD Cell [phone]

## 2015-07-09 NOTE — Discharge Instructions (Signed)
INSTRUCTIONS AFTER JOINT REPLACEMENT  ° °o Remove items at home which could result in a fall. This includes throw rugs or furniture in walking pathways °o ICE to the affected joint every three hours while awake for 30 minutes at a time, for at least the first 3-5 days, and then as needed for pain and swelling.  Continue to use ice for pain and swelling. You may notice swelling that will progress down to the foot and ankle.  This is normal after surgery.  Elevate your leg when you are not up walking on it.   °o Continue to use the breathing machine you got in the hospital (incentive spirometer) which will help keep your temperature down.  It is common for your temperature to cycle up and down following surgery, especially at night when you are not up moving around and exerting yourself.  The breathing machine keeps your lungs expanded and your temperature down. ° ° °DIET:  As you were doing prior to hospitalization, we recommend a well-balanced diet. ° °DRESSING / WOUND CARE / SHOWERING ° °You may change your dressing 3-5 days after surgery.  Then change the dressing every day with sterile gauze.  Please use good hand washing techniques before changing the dressing.  Do not use any lotions or creams on the incision until instructed by your surgeon. ° °ACTIVITY ° °o Increase activity slowly as tolerated, but follow the weight bearing instructions below.   °o No driving for 6 weeks or until further direction given by your physician.  You cannot drive while taking narcotics.  °o No lifting or carrying greater than 10 lbs. until further directed by your surgeon. °o Avoid periods of inactivity such as sitting longer than an hour when not asleep. This helps prevent blood clots.  °o You may return to work once you are authorized by your doctor.  ° ° ° °WEIGHT BEARING  ° °Weight bearing as tolerated with assist device (walker, cane, etc) as directed, use it as long as suggested by your surgeon or therapist, typically at  least 4-6 weeks. ° ° °EXERCISES ° °Results after joint replacement surgery are often greatly improved when you follow the exercise, range of motion and muscle strengthening exercises prescribed by your doctor. Safety measures are also important to protect the joint from further injury. Any time any of these exercises cause you to have increased pain or swelling, decrease what you are doing until you are comfortable again and then slowly increase them. If you have problems or questions, call your caregiver or physical therapist for advice.  ° °Rehabilitation is important following a joint replacement. After just a few days of immobilization, the muscles of the leg can become weakened and shrink (atrophy).  These exercises are designed to build up the tone and strength of the thigh and leg muscles and to improve motion. Often times heat used for twenty to thirty minutes before working out will loosen up your tissues and help with improving the range of motion but do not use heat for the first two weeks following surgery (sometimes heat can increase post-operative swelling).  ° °These exercises can be done on a training (exercise) mat, on the floor, on a table or on a bed. Use whatever works the best and is most comfortable for you.    Use music or television while you are exercising so that the exercises are a pleasant break in your day. This will make your life better with the exercises acting as a break   in your routine that you can look forward to.   Perform all exercises about fifteen times, three times per day or as directed.  You should exercise both the operative leg and the other leg as well. ° °Exercises include: °  °• Quad Sets - Tighten up the muscle on the front of the thigh (Quad) and hold for 5-10 seconds.   °• Straight Leg Raises - With your knee straight (if you were given a brace, keep it on), lift the leg to 60 degrees, hold for 3 seconds, and slowly lower the leg.  Perform this exercise against  resistance later as your leg gets stronger.  °• Leg Slides: Lying on your back, slowly slide your foot toward your buttocks, bending your knee up off the floor (only go as far as is comfortable). Then slowly slide your foot back down until your leg is flat on the floor again.  °• Angel Wings: Lying on your back spread your legs to the side as far apart as you can without causing discomfort.  °• Hamstring Strength:  Lying on your back, push your heel against the floor with your leg straight by tightening up the muscles of your buttocks.  Repeat, but this time bend your knee to a comfortable angle, and push your heel against the floor.  You may put a pillow under the heel to make it more comfortable if necessary.  ° °A rehabilitation program following joint replacement surgery can speed recovery and prevent re-injury in the future due to weakened muscles. Contact your doctor or a physical therapist for more information on knee rehabilitation.  ° ° °CONSTIPATION ° °Constipation is defined medically as fewer than three stools per week and severe constipation as less than one stool per week.  Even if you have a regular bowel pattern at home, your normal regimen is likely to be disrupted due to multiple reasons following surgery.  Combination of anesthesia, postoperative narcotics, change in appetite and fluid intake all can affect your bowels.  ° °YOU MUST use at least one of the following options; they are listed in order of increasing strength to get the job done.  They are all available over the counter, and you may need to use some, POSSIBLY even all of these options:   ° °Drink plenty of fluids (prune juice may be helpful) and high fiber foods °Colace 100 mg by mouth twice a day  °Senokot for constipation as directed and as needed Dulcolax (bisacodyl), take with full glass of water  °Miralax (polyethylene glycol) once or twice a day as needed. ° °If you have tried all these things and are unable to have a bowel  movement in the first 3-4 days after surgery call either your surgeon or your primary doctor.   ° °If you experience loose stools or diarrhea, hold the medications until you stool forms back up.  If your symptoms do not get better within 1 week or if they get worse, check with your doctor.  If you experience "the worst abdominal pain ever" or develop nausea or vomiting, please contact the office immediately for further recommendations for treatment. ° ° °ITCHING:  If you experience itching with your medications, try taking only a single pain pill, or even half a pain pill at a time.  You can also use Benadryl over the counter for itching or also to help with sleep.  ° °TED HOSE STOCKINGS:  Use stockings on both legs until for at least 2 weeks or as   directed by physician office. They may be removed at night for sleeping. ° °MEDICATIONS:  See your medication summary on the “After Visit Summary” that nursing will review with you.  You may have some home medications which will be placed on hold until you complete the course of blood thinner medication.  It is important for you to complete the blood thinner medication as prescribed. ° °PRECAUTIONS:  If you experience chest pain or shortness of breath - call 911 immediately for transfer to the hospital emergency department.  ° °If you develop a fever greater that 101 F, purulent drainage from wound, increased redness or drainage from wound, foul odor from the wound/dressing, or calf pain - CONTACT YOUR SURGEON.   °                                                °FOLLOW-UP APPOINTMENTS:  If you do not already have a post-op appointment, please call the office for an appointment to be seen by your surgeon.  Guidelines for how soon to be seen are listed in your “After Visit Summary”, but are typically between 1-4 weeks after surgery. ° ° °MAKE SURE YOU:  °• Understand these instructions.  °• Get help right away if you are not doing well or get worse.  ° ° °Thank you for  letting us be a part of your medical care team.  It is a privilege we respect greatly.  We hope these instructions will help you stay on track for a fast and full recovery!  ° ° ° °Information on my medicine - XARELTO® (Rivaroxaban) ° °This medication education was reviewed with me or my healthcare representative as part of my discharge preparation.  The pharmacist that spoke with me during my hospital stay was:  Giorgia Wahler Dien, RPH ° °Why was Xarelto® prescribed for you? °Xarelto® was prescribed for you to reduce the risk of blood clots forming after orthopedic surgery. The medical term for these abnormal blood clots is venous thromboembolism (VTE). ° °What do you need to know about xarelto® ? °Take your Xarelto® ONCE DAILY at the same time every day. °You may take it either with or without food. ° °If you have difficulty swallowing the tablet whole, you may crush it and mix in applesauce just prior to taking your dose. ° °Take Xarelto® exactly as prescribed by your doctor and DO NOT stop taking Xarelto® without talking to the doctor who prescribed the medication.  Stopping without other VTE prevention medication to take the place of Xarelto® may increase your risk of developing a clot. ° °After discharge, you should have regular check-up appointments with your healthcare provider that is prescribing your Xarelto®.   ° °What do you do if you miss a dose? °If you miss a dose, take it as soon as you remember on the same day then continue your regularly scheduled once daily regimen the next day. Do not take two doses of Xarelto® on the same day.  ° °Important Safety Information °A possible side effect of Xarelto® is bleeding. You should call your healthcare provider right away if you experience any of the following: °? Bleeding from an injury or your nose that does not stop. °? Unusual colored urine (red or dark brown) or unusual colored stools (red or black). °? Unusual bruising for unknown reasons. °? A serious  fall   or if you hit your head (even if there is no bleeding). ° °Some medicines may interact with Xarelto® and might increase your risk of bleeding while on Xarelto®. To help avoid this, consult your healthcare provider or pharmacist prior to using any new prescription or non-prescription medications, including herbals, vitamins, non-steroidal anti-inflammatory drugs (NSAIDs) and supplements. ° °This website has more information on Xarelto®: www.xarelto.com. ° ° ° ° °

## 2015-07-09 NOTE — Evaluation (Signed)
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 Pt is now s/p L THA posterior approach on 07/08/15.   Clinical Impression   Pt was admitted for the above surgery. Performed OT evaluation and educated him on THPs during ADLs.  Pt will benefit from continued OT to increase independence with adls and follow through with these precautions.  Pt was independent at baseline and goals in acute are for min guard levels.    Follow Up Recommendations  SNF    Equipment Recommendations   (pt has high commode; may still need 3:1)    Recommendations for Other Services       Precautions / Restrictions Precautions Precautions: Fall;Posterior Hip Precaution Booklet Issued: Yes (comment) Precaution Comments: Reviewed hip precaution handout  Restrictions LLE Weight Bearing: Weight bearing as tolerated      Mobility Bed Mobility               General bed mobility comments: oob  Transfers   Equipment used: Rolling walker (2 wheeled)   Sit to Stand: Min assist         General transfer comment: cues for UE/LE placement and THPs    Balance                                            ADL Overall ADL's : Needs assistance/impaired     Grooming: Supervision/safety;Set up;Sitting   Upper Body Bathing: Supervision/ safety;Set up;Sitting   Lower Body Bathing: Minimal assistance;With adaptive equipment;Sit to/from stand   Upper Body Dressing : Minimal assistance;Sitting (iv)   Lower Body Dressing: Moderate assistance;With adaptive equipment;Sit to/from stand   Toilet Transfer: Minimal assistance;Ambulation;RW   Toileting- Clothing Manipulation and Hygiene: Minimal assistance;Sit to/from stand         General ADL Comments: educated on THPs and used AE. Ambulated to bathroom. Pt talks a lot and needed many  cues for THPs.  Reinforced thps and need to keep everything within his reach to avoid automatic reaching.  Wide sock aide worked better for him.       Vision     Perception     Praxis      Pertinent Vitals/Pain Pain Assessment: 0-10 Faces Pain Scale: Hurts little more Pain Location: L hip Pain Descriptors / Indicators: Sore Pain Intervention(s): Limited activity within patient's tolerance;Monitored during session;Premedicated before session;Repositioned     Hand Dominance Right   Extremity/Trunk Assessment Upper Extremity Assessment Upper Extremity Assessment: Overall WFL for tasks assessed           Communication Communication Communication: No difficulties   Cognition Arousal/Alertness: Awake/alert Behavior During Therapy: WFL for tasks assessed/performed Overall Cognitive Status: Within Functional Limits for tasks assessed (very talkative:  distracts himself at times; cues for THPS)                     General Comments       Exercises       Shoulder Instructions      Home Living Family/patient expects to be discharged to:: Skilled nursing facility Living Arrangements: Alone  Additional Comments: Pt reports it is already set up for him to go to SNF at d/c due to living alone and with multiple stairs at his home.       Prior Functioning/Environment Level of Independence: Independent             OT Diagnosis: Acute pain   OT Problem List: Decreased strength;Decreased activity tolerance;Pain;Decreased knowledge of use of DME or AE;Decreased knowledge of precautions   OT Treatment/Interventions: Self-care/ADL training;DME and/or AE instruction;Therapeutic activities;Patient/family education    OT Goals(Current goals can be found in the care plan section) Acute Rehab OT Goals Patient Stated Goal: Home after SNF and return to being independent OT Goal Formulation: With patient Time For Goal Achievement:  07/16/15 Potential to Achieve Goals: Good ADL Goals Pt Will Perform Grooming: with supervision;standing Pt Will Perform Lower Body Bathing: with min guard assist;with adaptive equipment;sit to/from stand Pt Will Perform Lower Body Dressing: with min guard assist;with adaptive equipment;sit to/from stand Pt Will Transfer to Toilet: with min guard assist;ambulating;bedside commode Pt Will Perform Toileting - Clothing Manipulation and hygiene: with min guard assist;sit to/from stand Additional ADL Goal #1: pt will recall 3/3 posterior THPs and only need occasional cues during ADLs for these (no more than 2 per session)  OT Frequency: Min 2X/week   Barriers to D/C:            Co-evaluation              End of Session    Activity Tolerance: Patient tolerated treatment well Patient left: in chair;with call bell/phone within reach;with chair alarm set   Time: 1111-1150 OT Time Calculation (min): 39 min Charges:  OT General Charges $OT Visit: 1 Procedure OT Evaluation $OT Eval Low Complexity: 1 Procedure OT Treatments $Self Care/Home Management : 23-37 mins G-Codes:    Damieon Armendariz 17-Jul-2015, 12:00 PM Marica Otter, OTR/L [phone] July 17, 2015

## 2015-07-09 NOTE — Clinical Social Work Placement (Signed)
   CLINICAL SOCIAL WORK PLACEMENT  NOTE  Date:  07/09/2015  Patient Details  Name: Lawrence West MRN: [id number] Date of Birth: 02/01/1946  Clinical Social Work is seeking post-discharge placement for this patient at the Skilled  Nursing Facility level of care (*CSW will initial, date and re-position this form in  chart as items are completed):  Yes   Patient/family provided with [facility] Clinical Social Work Department's list of facilities offering this level of care within the geographic area requested by the patient (or if unable, by the patient's family).  Yes   Patient/family informed of their freedom to choose among providers that offer the needed level of care, that participate in Medicare, Medicaid or managed care program needed by the patient, have an available bed and are willing to accept the patient.  Yes   Patient/family informed of [facility]'s ownership interest in [facility] and [facility], as well as of the fact that they are under no obligation to receive care at these facilities.  PASRR submitted to EDS on 07/09/15     PASRR number received on 07/09/15     Existing PASRR number confirmed on       FL2 transmitted to all facilities in geographic area requested by pt/family on 07/09/15     FL2 transmitted to all facilities within larger geographic area on       Patient informed that his/her managed care company has contracts with or will negotiate with certain facilities, including the following:        Yes   Patient/family informed of bed offers received.  Patient [facility] bed [facility]     Physician recommends and patient chooses bed at      Patient to [facility] on  .  Patient to be transferred to facility by [facility]     Patient family notified on   of transfer.  Name of family member notified:        PHYSICIAN Please sign FL2     Additional Comment:    _______________________________________________ [name],  Yasmin Dibello S, [name] 07/09/2015, 10:43 AM

## 2015-07-09 NOTE — Progress Notes (Signed)
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 Pt is now s/p L THA posterior approach on 07/08/15.    PT Comments    Pt progressing towards physical therapy goals. Was able to demonstrate improved gait distance this session however is not safe to ambulate without assistance. Pt frequently drifting to the right and requires cues to avoid obstacles in the hallway. Will continue to follow.   Follow Up Recommendations  SNF;Supervision/Assistance - 24 hour     Equipment Recommendations  None recommended by PT (TBD by next venue of care)    Recommendations for Other Services       Precautions / Restrictions Precautions Precautions: Fall;Posterior Hip Precaution Booklet Issued: Yes (comment) Precaution Comments: Pt able to recall 1/3 hip precautions even with cues. Reviewed 3/3.  Restrictions Weight Bearing Restrictions: Yes LLE Weight Bearing: Weight bearing as tolerated    Mobility  Bed Mobility               General bed mobility comments: Pt sitting up in recliner upon PT arrival.   Transfers Overall transfer level: Needs assistance Equipment used: Rolling walker (2 wheeled) Transfers: Sit to/from Stand Sit to Stand: Min guard         General transfer comment: Pt was able to power-up to full stand without assistance. VC's for hand placement on seated surface for safety.   Ambulation/Gait Ambulation/Gait assistance: Min guard Ambulation Distance (Feet): 275 Feet Assistive device: Rolling walker (2 wheeled) Gait Pattern/deviations: Step-through pattern;Decreased stride length;Trunk flexed Gait velocity: Decreased Gait velocity interpretation: Below normal speed for age/gender General Gait Details: Pt was able to ambulate in the hall this session and was motivated for distance. Noted drifting to the R and generally poor  awareness of upcoming obstacles. Pt required cues to avoid obstacles but occasionally ran into them with the walker.    Stairs            Wheelchair Mobility    Modified Rankin (Stroke Patients Only)       Balance Overall balance assessment: Needs assistance Sitting-balance support: Feet supported;No upper extremity supported Sitting balance-Leahy Scale: Fair     Standing balance support: Bilateral upper extremity supported;During functional activity Standing balance-Leahy Scale: Poor Standing balance comment: Reliant on RW for UE support                    Cognition Arousal/Alertness: Awake/alert Behavior During Therapy: WFL for tasks assessed/performed Overall Cognitive Status: Within Functional Limits for tasks assessed                      Exercises      General Comments General comments (skin integrity, edema, etc.): Reviewed HEP that was introduced yesterday, and added heel slides and hip abduction.      Pertinent Vitals/Pain Pain Assessment: Faces Faces Pain Scale: Hurts a little bit Pain Location: L hip Pain Descriptors / Indicators: Operative site guarding;Sore Pain Intervention(s): Limited activity within patient's tolerance;Monitored during session;Premedicated before session    Home Living Family/patient expects to be discharged to:: Skilled nursing facility Living Arrangements: Alone             Additional Comments: Pt reports it is already set up for him to go to SNF at d/c due to living alone and with multiple stairs at his home.  Prior Function Level of Independence: Independent          PT Goals (current goals can now be found in the care plan section) Acute Rehab PT Goals Patient Stated Goal: Home after SNF PT Goal Formulation: With patient Time For Goal Achievement: 07/22/15 Potential to Achieve Goals: Good Progress towards PT goals: Progressing toward goals    Frequency  7X/week    PT Plan Current plan  remains appropriate    Co-evaluation             End of Session Equipment Utilized During Treatment: Gait belt Activity Tolerance: Patient tolerated treatment well Patient left: in chair;with call bell/phone within reach;with chair alarm set     Time: [id number] PT Time Calculation (min) (ACUTE ONLY): 24 min  Charges:  $Gait Training: 23-37 mins                    G Codes:      Conni SlipperKirkman, Cherlyn Syring 07/09/2015, 1:40 PM   Conni SlipperLaura Solina Heron, PT, DPT Acute Rehabilitation Services Pager: [phone]

## 2015-07-10 ENCOUNTER — Encounter (HOSPITAL_COMMUNITY): Payer: Self-pay | Admitting: Orthopedic Surgery

## 2015-07-10 LAB — CBC
HEMATOCRIT: 21.6 % — AB (ref 39.0–52.0)
Hemoglobin: 7 g/dL — ABNORMAL LOW (ref 13.0–17.0)
MCH: 30.8 pg (ref 26.0–34.0)
MCHC: 32.4 g/dL (ref 30.0–36.0)
MCV: 95.2 fL (ref 78.0–100.0)
PLATELETS: 117 10*3/uL — AB (ref 150–400)
RBC: 2.27 MIL/uL — ABNORMAL LOW (ref 4.22–5.81)
RDW: 16 % — AB (ref 11.5–15.5)
WBC: 17.2 10*3/uL — AB (ref 4.0–10.5)

## 2015-07-10 LAB — PREPARE RBC (CROSSMATCH)

## 2015-07-10 LAB — BASIC METABOLIC PANEL
ANION GAP: 4 — AB (ref 5–15)
BUN: 21 mg/dL — ABNORMAL HIGH (ref 6–20)
CALCIUM: 8.1 mg/dL — AB (ref 8.9–10.3)
CO2: 24 mmol/L (ref 22–32)
CREATININE: 1.14 mg/dL (ref 0.61–1.24)
Chloride: 105 mmol/L (ref 101–111)
Glucose, Bld: 111 mg/dL — ABNORMAL HIGH (ref 65–99)
Potassium: 4.3 mmol/L (ref 3.5–5.1)
Sodium: 133 mmol/L — ABNORMAL LOW (ref 135–145)

## 2015-07-10 MED ORDER — SODIUM CHLORIDE 0.9 % IV SOLN
Freq: Once | INTRAVENOUS | Status: AC
Start: 2015-07-10 — End: 2015-07-10
  Administered 2015-07-10: 11:00:00 via INTRAVENOUS

## 2015-07-10 NOTE — Progress Notes (Signed)
Patient ID: Lawrence West, male   DOB: 06/03/1945, 70 y.o.   MRN: [id number]     Subjective:  Patient reports pain as mild.  Patient in bed and in no acute distress.  Denies any CP or SOB  Objective:   VITALS:   Filed Vitals:   07/09/15 0518 07/09/15 1340 07/09/15 2106 07/10/15 0558  BP: 107/63 109/58 99/57 91/60   Pulse: 96 77 82 86  Temp: 98.7 F (37.1 C) 98.1 F (36.7 C) 99.1 F (37.3 C) 99.2 F (37.3 C)  TempSrc: Oral  Oral Oral  Resp: 16 18 19 18   Height:      Weight:      SpO2: 98% 98% 98% 98%    ABD soft Sensation intact distally Dorsiflexion/Plantar flexion intact Incision: dressing C/D/I and no drainage   Lab Results  Component Value Date   WBC 17.2* 07/10/2015   HGB 7.0* 07/10/2015   HCT 21.6* 07/10/2015   MCV 95.2 07/10/2015   PLT 117* 07/10/2015   BMET    Component Value Date/Time   NA 133* 07/10/2015 0400   NA 138 10/23/2013 1338   K 4.3 07/10/2015 0400   K 3.6 10/23/2013 1338   CL 105 07/10/2015 0400   CL 104 10/23/2013 1338   CO2 24 07/10/2015 0400   CO2 24 10/23/2013 1338   GLUCOSE 111* 07/10/2015 0400   GLUCOSE 157* 10/23/2013 1338   BUN 21* 07/10/2015 0400   BUN 8 10/23/2013 1338   CREATININE 1.14 07/10/2015 0400   CREATININE 0.95 10/23/2013 1338   CALCIUM 8.1* 07/10/2015 0400   CALCIUM 9.4 10/23/2013 1338   GFRNONAA >60 07/10/2015 0400   GFRNONAA >60 10/23/2013 1338   GFRAA >60 07/10/2015 0400   GFRAA >60 10/23/2013 1338     Assessment/Plan: 2 Days Post-Op   Principal Problem:   Avascular necrosis of bone of left hip (HCC) Active Problems:   S/P total hip arthroplasty   Advance diet Up with therapy Discharge to SNF when bed available ABLA two units PRBC's WBAT Dry dressing PRN    DOUGLAS PARRY, BRANDON 07/10/2015, 6:52 AM   Seen and agree.  Slight abdominal distension, but positive BM, will monitor for ileus.    SNF likely tomorrow.  Will recheck CBC.  ABLA secondary to cirrhosis.    Lawrence LucyJoshua Aleister Lady, MD Cell  [phone]

## 2015-07-10 NOTE — Progress Notes (Signed)
Physical Therapy Treatment Patient Details Name: Lawrence LoaDavid H Drolet MRN: [id number] DOB: 09/09/1945 Today's Date: 07/10/2015    History of Present Illness Pt is a 70 y/o male with AVN of the L hip. Pt is now s/p L THA posterior approach on 07/08/15.    PT Comments    Pt progressing towards physical therapy goals. Therapeutic exercise was focus of session prior to ambulation, and pt fatigued quickly. When gait training was initiated, pt required several rest breaks due to pain and fatigue. Will continue to follow and progress as able per POC.   Follow Up Recommendations  SNF;Supervision/Assistance - 24 hour     Equipment Recommendations  None recommended by PT (TBD by next venue of care)    Recommendations for Other Services       Precautions / Restrictions Precautions Precautions: Fall;Posterior Hip Precaution Booklet Issued: Yes (comment) Precaution Comments: Pt able to recall 1/3 hip precautions even with cues. Reviewed 3/3.  Restrictions Weight Bearing Restrictions: Yes LLE Weight Bearing: Weight bearing as tolerated    Mobility  Bed Mobility               General bed mobility comments: Pt sitting up in recliner upon PT arrival.   Transfers Overall transfer level: Needs assistance Equipment used: Rolling walker (2 wheeled) Transfers: Sit to/from Stand Sit to Stand: Min guard         General transfer comment: Pt was able to power-up to full stand without assistance. VC's for hand placement on seated surface for safety.   Ambulation/Gait Ambulation/Gait assistance: Min guard Ambulation Distance (Feet): 120 Feet Assistive device: Rolling walker (2 wheeled) Gait Pattern/deviations: Step-through pattern;Decreased stride length;Trunk flexed Gait velocity: Decreased Gait velocity interpretation: Below normal speed for age/gender General Gait Details: VC's for improved posture and general sequencing with the RW. Pt required many rest breaks this session due to fatigue  and soreness.    Stairs            Wheelchair Mobility    Modified Rankin (Stroke Patients Only)       Balance Overall balance assessment: Needs assistance Sitting-balance support: Feet supported;No upper extremity supported Sitting balance-Leahy Scale: Fair     Standing balance support: No upper extremity supported Standing balance-Leahy Scale: Poor Standing balance comment: Reliant on RW at this time.                     Cognition Arousal/Alertness: Awake/alert Behavior During Therapy: WFL for tasks assessed/performed Overall Cognitive Status: Within Functional Limits for tasks assessed                      Exercises Total Joint Exercises Quad Sets: 10 reps Short Arc Quad: 15 reps Hip ABduction/ADduction: 10 reps Marching in Standing: 10 reps;Both    General Comments        Pertinent Vitals/Pain Pain Assessment: Faces Faces Pain Scale: Hurts little more Pain Location: L hip Pain Descriptors / Indicators: Operative site guarding;Sore Pain Intervention(s): Limited activity within patient's tolerance;Monitored during session;Repositioned    Home Living                      Prior Function            PT Goals (current goals can now be found in the care plan section) Acute Rehab PT Goals Patient Stated Goal: Home after SNF PT Goal Formulation: With patient Time For Goal Achievement: 07/22/15 Potential to Achieve Goals: Good Progress towards  PT goals: Progressing toward goals    Frequency  7X/week    PT Plan Current plan remains appropriate    Co-evaluation             End of Session Equipment Utilized During Treatment: Gait belt Activity Tolerance: Patient tolerated treatment well Patient left: in chair;with call bell/phone within reach;with chair alarm set     Time: 1021-1057 PT Time Calculation (min) (ACUTE ONLY): 36 min  Charges:  $Gait Training: 8-22 mins $Therapeutic Exercise: 8-22 mins                     G Codes:      Conni Slipper 2015-07-15, 2:41 PM   Conni Slipper, PT, DPT Acute Rehabilitation Services Pager: [phone]

## 2015-07-11 DIAGNOSIS — Z96642 Presence of left artificial hip joint: Secondary | ICD-10-CM | POA: Diagnosis not present

## 2015-07-11 DIAGNOSIS — D696 Thrombocytopenia, unspecified: Secondary | ICD-10-CM | POA: Diagnosis not present

## 2015-07-11 DIAGNOSIS — M1612 Unilateral primary osteoarthritis, left hip: Secondary | ICD-10-CM | POA: Diagnosis not present

## 2015-07-11 DIAGNOSIS — R2689 Other abnormalities of gait and mobility: Secondary | ICD-10-CM | POA: Diagnosis not present

## 2015-07-11 DIAGNOSIS — M6281 Muscle weakness (generalized): Secondary | ICD-10-CM | POA: Diagnosis not present

## 2015-07-11 DIAGNOSIS — I10 Essential (primary) hypertension: Secondary | ICD-10-CM | POA: Diagnosis not present

## 2015-07-11 DIAGNOSIS — M8788 Other osteonecrosis, other site: Secondary | ICD-10-CM | POA: Diagnosis not present

## 2015-07-11 DIAGNOSIS — D72829 Elevated white blood cell count, unspecified: Secondary | ICD-10-CM | POA: Diagnosis not present

## 2015-07-11 DIAGNOSIS — K7031 Alcoholic cirrhosis of liver with ascites: Secondary | ICD-10-CM | POA: Diagnosis not present

## 2015-07-11 DIAGNOSIS — E876 Hypokalemia: Secondary | ICD-10-CM | POA: Diagnosis not present

## 2015-07-11 DIAGNOSIS — F329 Major depressive disorder, single episode, unspecified: Secondary | ICD-10-CM | POA: Diagnosis not present

## 2015-07-11 DIAGNOSIS — R600 Localized edema: Secondary | ICD-10-CM | POA: Diagnosis not present

## 2015-07-11 DIAGNOSIS — E46 Unspecified protein-calorie malnutrition: Secondary | ICD-10-CM | POA: Diagnosis not present

## 2015-07-11 DIAGNOSIS — E785 Hyperlipidemia, unspecified: Secondary | ICD-10-CM | POA: Diagnosis not present

## 2015-07-11 DIAGNOSIS — R269 Unspecified abnormalities of gait and mobility: Secondary | ICD-10-CM | POA: Diagnosis not present

## 2015-07-11 DIAGNOSIS — Z79899 Other long term (current) drug therapy: Secondary | ICD-10-CM | POA: Diagnosis not present

## 2015-07-11 DIAGNOSIS — Z4789 Encounter for other orthopedic aftercare: Secondary | ICD-10-CM | POA: Diagnosis not present

## 2015-07-11 DIAGNOSIS — D509 Iron deficiency anemia, unspecified: Secondary | ICD-10-CM | POA: Diagnosis not present

## 2015-07-11 DIAGNOSIS — D62 Acute posthemorrhagic anemia: Secondary | ICD-10-CM | POA: Diagnosis not present

## 2015-07-11 DIAGNOSIS — R2681 Unsteadiness on feet: Secondary | ICD-10-CM | POA: Diagnosis not present

## 2015-07-11 DIAGNOSIS — Z7901 Long term (current) use of anticoagulants: Secondary | ICD-10-CM | POA: Diagnosis not present

## 2015-07-11 DIAGNOSIS — M87052 Idiopathic aseptic necrosis of left femur: Secondary | ICD-10-CM | POA: Diagnosis not present

## 2015-07-11 DIAGNOSIS — R278 Other lack of coordination: Secondary | ICD-10-CM | POA: Diagnosis not present

## 2015-07-11 DIAGNOSIS — Z9660 Presence of unspecified orthopedic joint implant: Secondary | ICD-10-CM | POA: Diagnosis not present

## 2015-07-11 LAB — CBC
HCT: 28.5 % — ABNORMAL LOW (ref 39.0–52.0)
Hemoglobin: 9.3 g/dL — ABNORMAL LOW (ref 13.0–17.0)
MCH: 31.3 pg (ref 26.0–34.0)
MCHC: 32.6 g/dL (ref 30.0–36.0)
MCV: 96 fL (ref 78.0–100.0)
PLATELETS: 137 10*3/uL — AB (ref 150–400)
RBC: 2.97 MIL/uL — AB (ref 4.22–5.81)
RDW: 16.6 % — AB (ref 11.5–15.5)
WBC: 15 10*3/uL — ABNORMAL HIGH (ref 4.0–10.5)

## 2015-07-11 LAB — TYPE AND SCREEN
ABO/RH(D): A POS
Antibody Screen: NEGATIVE
UNIT DIVISION: 0
Unit division: 0

## 2015-07-11 NOTE — Progress Notes (Signed)
Report given to [facility]. 

## 2015-07-11 NOTE — Clinical Social Work Placement (Signed)
   CLINICAL SOCIAL WORK PLACEMENT  NOTE  Date:  07/11/2015  Patient Details  Name: Lawrence West MRN: [id number] Date of Birth: 02/23/1945  Clinical Social Work is seeking post-discharge placement for this patient at the Skilled  Nursing Facility level of care (*CSW will initial, date and re-position this form in  chart as items are completed):  Yes   Patient/family provided with [facility] Clinical Social Work Department's list of facilities offering this level of care within the geographic area requested by the patient (or if unable, by the patient's family).  Yes   Patient/family informed of their freedom to choose among providers that offer the needed level of care, that participate in Medicare, Medicaid or managed care program needed by the patient, have an available bed and are willing to accept the patient.  Yes   Patient/family informed of [facility]'s ownership interest in [facility] and [facility], as well as of the fact that they are under no obligation to receive care at these facilities.  PASRR submitted to EDS on 07/09/15     PASRR number received on 07/09/15     Existing PASRR number confirmed on       FL2 transmitted to all facilities in geographic area requested by pt/family on 07/09/15     FL2 transmitted to all facilities within larger geographic area on       Patient informed that his/her managed care company has contracts with or will negotiate with certain facilities, including the following:        Yes   Patient/family informed of bed offers received.  [facility]     Physician recommends and patient chooses bed at      [facility] on 07/11/15.  Patient to be transferred to facility by [facility]     Patient family notified on 07/11/15 of transfer.  Name of family member notified:  Patient     PHYSICIAN Please sign FL2     Additional Comment:     _______________________________________________ [name], [name], [name] 07/11/2015, 1:55 PM

## 2015-07-11 NOTE — Clinical Social Work Note (Signed)
[facility] to be [facility]. Patient to be transported via EMS. RN report number: [phone]  [facility] authorization: [id number]  Marcelline Deistmily Mando Blatz, [place] [phone] Orthopedics: [phone] Surgical: [phone]

## 2015-07-11 NOTE — Progress Notes (Signed)
Physical Therapy Treatment Patient Details Name: Lawrence LoaDavid H West MRN: [id number] DOB: 07/07/1945 Today's Date: 07/11/2015    History of Present Illness Pt is a [age] y/o male with AVN of the L hip. Pt is now s/p L THA posterior approach on 07/08/15.    PT Comments    Pt progressing towards physical therapy goals. Was able to perform transfers and ambulation with min guard to min assist for safety and balance support. Pt continues to fatigue quickly and require cues for proper maintenance of hip precautions throughout functional mobility. Will continue to follow until d/c.   Follow Up Recommendations  SNF;Supervision/Assistance - 24 hour     Equipment Recommendations  None recommended by PT (TBD by next venue of care)    Recommendations for Other Services       Precautions / Restrictions Precautions Precautions: Fall;Posterior Hip Precaution Booklet Issued: Yes (comment) Precaution Comments: Pt able to recall 1/3 hip precautions even with cues. Reviewed 3/3.  Restrictions Weight Bearing Restrictions: Yes LLE Weight Bearing: Weight bearing as tolerated    Mobility  Bed Mobility Overal bed mobility: Needs Assistance Bed Mobility: Supine to Sit     Supine to sit: Min assist     General bed mobility comments: Assist for movement of operative LE. Pt required increased time to achieve EOB. Heavy use of bed rails for support.  Transfers Overall transfer level: Needs assistance Equipment used: Rolling walker (2 wheeled) Transfers: Sit to/from Stand Sit to Stand: Min guard         General transfer comment: Pt was able to power-up to full stand without assistance. VC's for hand placement on seated surface for safety.   Ambulation/Gait Ambulation/Gait assistance: Min guard Ambulation Distance (Feet): 200 Feet Assistive device: Rolling walker (2 wheeled) Gait Pattern/deviations: Step-through pattern;Decreased stride length;Trunk flexed Gait velocity: Decreased Gait velocity  interpretation: Below normal speed for age/gender General Gait Details: VC's for improved posture and general sequencing with the RW. Occasional rest breaks required due to fatigue/soreness.   Stairs            Wheelchair Mobility    Modified Rankin (Stroke Patients Only)       Balance Overall balance assessment: Needs assistance Sitting-balance support: Feet supported;No upper extremity supported Sitting balance-Leahy Scale: Fair     Standing balance support: No upper extremity supported;During functional activity Standing balance-Leahy Scale: Fair Standing balance comment: Statically                    Cognition Arousal/Alertness: Awake/alert Behavior During Therapy: WFL for tasks assessed/performed Overall Cognitive Status: Within Functional Limits for tasks assessed                      Exercises      General Comments General comments (skin integrity, edema, etc.): Reviewed HEP and added LAQ      Pertinent Vitals/Pain Pain Assessment: Faces Faces Pain Scale: Hurts little more Pain Location: L hip Pain Descriptors / Indicators: Operative site guarding;Sore Pain Intervention(s): Monitored during session;Limited activity within patient's tolerance;Repositioned    Home Living                      Prior Function            PT Goals (current goals can now be found in the care plan section) Acute Rehab PT Goals Patient Stated Goal: Home after SNF PT Goal Formulation: With patient Time For Goal Achievement: 07/22/15 Potential to Achieve Goals:  Good Progress towards PT goals: Progressing toward goals    Frequency  7X/week    PT Plan Current plan remains appropriate    Co-evaluation             End of Session Equipment Utilized During Treatment: Gait belt Activity Tolerance: Patient tolerated treatment well Patient left: in chair;with call bell/phone within reach;with chair alarm set     Time: 1610-9604 PT Time  Calculation (min) (ACUTE ONLY): 32 min  Charges:  $Gait Training: 23-37 mins                    G Codes:      Conni Slipper 08-04-2015, 2:17 PM   Conni Slipper, PT, DPT Acute Rehabilitation Services Pager: [phone]

## 2015-07-11 NOTE — Discharge Summary (Addendum)
Physician Discharge Summary  Patient ID: LAWRENCE MITCH MRN: [id number] DOB/AGE: [birth year] 70 y.o.  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 Cirrhosis of liver (HCC)     Sees Dr. Sung Amabile  . Ascites   . Hypertension   . Gout X 1  . Narcolepsy   . Depression   . Edema of both legs     Takes Lasix  . Rheumatic fever   . Avascular necrosis of bone of left hip (HCC) 07/08/2015  . High cholesterol   . Sleep apnea     pt stated "I have sleep apnea but I do not use the machine...Marland KitchenMarland KitchenMarland Kitchenit broke" (07/08/2015)  . Arthritis     "left knee" (07/08/2015)  . Brown recluse spider bite 1990s    "they thought I was gonna die then"    Surgeries: Procedure(s): LEFT TOTAL HIP ARTHROPLASTY on 07/08/2015   Consultants (if any):    Discharged Condition: Improved  Hospital Course: HERMON ZEA is an 70 y.o. male who was admitted 07/08/2015 with a diagnosis of Avascular necrosis of bone of left hip (HCC) and went to the operating room on 07/08/2015 and underwent the above named procedures.    He was given perioperative antibiotics:  Anti-infectives    Start     Dose/Rate Route Frequency Ordered Stop   07/08/15 1300  ceFAZolin (ANCEF) IVPB 2g/100 mL premix     2 g 200 mL/hr over 30 Minutes Intravenous Every 6 hours 07/08/15 1117 07/08/15 1742   07/08/15 0523  ceFAZolin (ANCEF) IVPB 2g/100 mL premix     2 g 200 mL/hr over 30 Minutes Intravenous On call to O.R. 07/08/15 4098 07/08/15 1191    .  He was given sequential compression devices, early ambulation, and xarelto for DVT prophylaxis.  He benefited maximally from the hospital stay and there were no complications.  He had significant blood loss  anemia with a hemoglobin down to 7 and was transfused 2 units of packed red blood cells and responded appropriately.  Recent vital signs:  Filed Vitals:   07/10/15 2032 07/11/15 0442  BP: 96/51 88/44  Pulse: 85 89  Temp: 99.2 F (37.3 C) 98.8 F (37.1 C)  Resp: 16 16    Recent laboratory studies:  Lab Results  Component Value Date   HGB 9.3* 07/11/2015   HGB 7.0* 07/10/2015   HGB 8.6* 07/09/2015   Lab Results  Component Value Date   WBC 15.0* 07/11/2015   PLT 137* 07/11/2015   Lab Results  Component Value Date   INR 1.16 05/19/2015   Lab Results  Component Value Date   NA 133* 07/10/2015   K 4.3 07/10/2015   CL 105 07/10/2015   CO2 24 07/10/2015   BUN 21* 07/10/2015   CREATININE 1.14 07/10/2015   GLUCOSE 111* 07/10/2015    Discharge Medications:     Medication List    STOP taking these medications        aspirin EC 81 MG tablet     meloxicam 15 MG tablet  Commonly known as:  MOBIC      TAKE these medications        atorvastatin 10 MG tablet  Commonly known as:  LIPITOR  Take 1 tablet (10 mg total) by mouth daily.     baclofen 10 MG tablet  Commonly known as:  LIORESAL  Take 1 tablet (10 mg total) by mouth 3 (three) times daily. As needed for muscle spasm     buPROPion 150 MG 24 hr tablet  Commonly known as:  WELLBUTRIN XL  Take 1 tablet (150 mg total) by mouth daily.     furosemide 40 MG tablet  Commonly known as:  LASIX  Take 1 tablet (40 mg total) by mouth daily.     Lactulose 20 GM/30ML Soln  Take 30 mLs (20 g total) by mouth 2 (two) times daily. HOLD FOR > 3 bowel movements /day     losartan-hydrochlorothiazide 50-12.5 MG tablet  Commonly known as:  HYZAAR  Take 1 tablet by mouth daily.     multivitamin with minerals Tabs tablet  Take 1 tablet by mouth daily.     ondansetron 4 MG tablet  Commonly known as:  ZOFRAN  Take 1 tablet (4 mg total) by mouth every 8 (eight) hours as needed for nausea or vomiting.     Oxycodone HCl 10 MG  Tabs  Take 1 tablet (10 mg total) by mouth 4 (four) times daily as needed (severe pain).     potassium chloride SA 20 MEQ tablet  Commonly known as:  K-DUR,KLOR-CON  Take 1 tablet (20 mEq total) by mouth daily.     rivaroxaban 10 MG Tabs tablet  Commonly known as:  XARELTO  Take 1 tablet (10 mg total) by mouth daily.     sennosides-docusate sodium 8.6-50 MG tablet  Commonly known as:  SENOKOT-S  Take 2 tablets by mouth daily.     spironolactone 25 MG tablet  Commonly known as:  ALDACTONE  Take 1 tablet (25 mg total) by mouth daily.        Diagnostic Studies: Dg Pelvis Portable  07/08/2015  ADDENDUM REPORT: 07/08/2015 13:16 ADDENDUM: Cross table lateral radiograph of the proximal left femur was also acquired. This demonstrates the femoral component of the left hip arthroplasty device. No complications identified. Electronically Signed   By: Signa Kellaylor  Stroud M.D.   On: 07/08/2015 13:16  07/08/2015  CLINICAL DATA:  Left total hip arthroplasty. EXAM: PORTABLE PELVIS 1-2 VIEWS COMPARISON:  None. FINDINGS: Status post left total hip arthroplasty. The hardware components are in anatomic alignment. No complicating features identified. No dislocation or periprosthetic fracture identified. IMPRESSION: 1. No complications after left hip arthroplasty. Electronically Signed: By: Signa Kellaylor  Stroud M.D. On: 07/08/2015 11:01    Disposition: SNF      Discharge Instructions    Weight bearing as tolerated    Complete by:  As directed            Follow-up Information    Follow up with Eulas PostLANDAU,Joakim Huesman P, MD. Schedule an appointment as soon as possible for a visit in 2 weeks.   Specialty:  Orthopedic Surgery   Contact information:   [address] [place] [address] [phone]        Signed: Eulas PostLANDAU,Nadie Fiumara P 07/11/2015, 1:47 PM

## 2015-07-11 NOTE — Progress Notes (Signed)
Patient ID: Lawrence West, male   DOB: 01/25/1946, 70 y.o.   MRN: [id number]     Subjective:  Patient reports pain as mild.  Patient denies any CP or SOB  Objective:   VITALS:   Filed Vitals:   07/10/15 1347 07/10/15 1540 07/10/15 2032 07/11/15 0442  BP: 94/53 99/57 96/51  88/44  Pulse: 84 90 85 89  Temp: 99.8 F (37.7 C) 98 F (36.7 C) 99.2 F (37.3 C) 98.8 F (37.1 C)  TempSrc: Oral Oral Oral Oral  Resp: 20 20 16 16   Height:      Weight:      SpO2: 96% 98% 96% 94%    ABD soft Sensation intact distally Dorsiflexion/Plantar flexion intact Incision: dressing C/D/I and no drainage   Lab Results  Component Value Date   WBC 15.0* 07/11/2015   HGB 9.3* 07/11/2015   HCT 28.5* 07/11/2015   MCV 96.0 07/11/2015   PLT 137* 07/11/2015   BMET    Component Value Date/Time   NA 133* 07/10/2015 0400   NA 138 10/23/2013 1338   K 4.3 07/10/2015 0400   K 3.6 10/23/2013 1338   CL 105 07/10/2015 0400   CL 104 10/23/2013 1338   CO2 24 07/10/2015 0400   CO2 24 10/23/2013 1338   GLUCOSE 111* 07/10/2015 0400   GLUCOSE 157* 10/23/2013 1338   BUN 21* 07/10/2015 0400   BUN 8 10/23/2013 1338   CREATININE 1.14 07/10/2015 0400   CREATININE 0.95 10/23/2013 1338   CALCIUM 8.1* 07/10/2015 0400   CALCIUM 9.4 10/23/2013 1338   GFRNONAA >60 07/10/2015 0400   GFRNONAA >60 10/23/2013 1338   GFRAA >60 07/10/2015 0400   GFRAA >60 10/23/2013 1338     Assessment/Plan: 3 Days Post-Op   Principal Problem:   Avascular necrosis of bone of left hip (HCC) Active Problems:   S/P total hip arthroplasty   Advance diet Up with therapy Discharge to SNF WBAT Dry dressing PRN   Lawrence West, Lawrence West 07/11/2015, 9:46 AM  Discussed with the above.  Discharge to skilled nursing facility today, discharge summary completed.  Lawrence LucyJoshua Caelie [place], MD Cell [phone]

## 2015-07-11 NOTE — Care Management Important Message (Signed)
Important Message  Patient Details  Name: Lawrence West MRN: [id number] Date of Birth: 05/02/1945   Medicare Important Message Given:  Yes    Bernadette HoitShoffner, Merci Walthers Coleman 07/11/2015, 9:56 AM

## 2015-07-14 ENCOUNTER — Non-Acute Institutional Stay (SKILLED_NURSING_FACILITY): Payer: Commercial Managed Care - HMO | Admitting: Internal Medicine

## 2015-07-14 ENCOUNTER — Encounter: Payer: Self-pay | Admitting: Internal Medicine

## 2015-07-14 DIAGNOSIS — D72829 Elevated white blood cell count, unspecified: Secondary | ICD-10-CM | POA: Diagnosis not present

## 2015-07-14 DIAGNOSIS — K7031 Alcoholic cirrhosis of liver with ascites: Secondary | ICD-10-CM | POA: Diagnosis not present

## 2015-07-14 DIAGNOSIS — E785 Hyperlipidemia, unspecified: Secondary | ICD-10-CM

## 2015-07-14 DIAGNOSIS — D62 Acute posthemorrhagic anemia: Secondary | ICD-10-CM

## 2015-07-14 DIAGNOSIS — D696 Thrombocytopenia, unspecified: Secondary | ICD-10-CM

## 2015-07-14 DIAGNOSIS — M87052 Idiopathic aseptic necrosis of left femur: Secondary | ICD-10-CM

## 2015-07-14 DIAGNOSIS — F32A Depression, unspecified: Secondary | ICD-10-CM

## 2015-07-14 DIAGNOSIS — F329 Major depressive disorder, single episode, unspecified: Secondary | ICD-10-CM

## 2015-07-14 DIAGNOSIS — I10 Essential (primary) hypertension: Secondary | ICD-10-CM

## 2015-07-14 DIAGNOSIS — E46 Unspecified protein-calorie malnutrition: Secondary | ICD-10-CM

## 2015-07-14 DIAGNOSIS — K5900 Constipation, unspecified: Secondary | ICD-10-CM | POA: Diagnosis not present

## 2015-07-14 DIAGNOSIS — R600 Localized edema: Secondary | ICD-10-CM

## 2015-07-14 DIAGNOSIS — R2681 Unsteadiness on feet: Secondary | ICD-10-CM | POA: Diagnosis not present

## 2015-07-14 DIAGNOSIS — E876 Hypokalemia: Secondary | ICD-10-CM | POA: Diagnosis not present

## 2015-07-14 NOTE — Progress Notes (Signed)
LOCATION: [facility]  PCP: Rozanna Box, MD   Code Status: Full Code  Goals of care: Advanced Directive information Advanced Directives 07/08/2015  Does patient have an advance directive? No  Would patient like information on creating an advanced directive? Yes - Transport planner given       Extended Emergency Contact Information Primary Emergency Contact: Pagett,Charlotte  [place] Home Phone: [phone] Relation: Sister Secondary Emergency Contact: Benson,Ollie Address: [address]          [place], [place] [address] [place] Home Phone: [phone] Relation: Mother   No Known Allergies  Chief Complaint  Patient presents with  . New Admit To SNF    New Admission     HPI:  Patient is a 70 y.o. male seen today for short term rehabilitation post hospital admission from 07/08/15-07/11/15 with avascular necrosis of bone of left hip. He underwent total hip arthroplasty on 07/08/15. He is seen in his room today. His pain is under control with current pain regimen. He feels weak and tired. He had ABLA and received 2 u prbc in hospital.    Review of Systems:  Constitutional: Negative for fever, chills and diaphoresis. Gets tired easily. HENT: Negative for headache, congestion, nasal discharge, sore throat, difficulty swallowing.   Eyes: Negative for blurred vision, double vision and discharge.  Respiratory: Negative for cough, shortness of breath and wheezing.   Cardiovascular: Negative for chest pain, palpitations.  Gastrointestinal: Negative for heartburn, nausea, vomiting, abdominal pain, melena. Last bowel movement was yesterday.   Genitourinary: Negative for dysuria Musculoskeletal: Negative for back pain, fall in the facility.  Skin: Negative for itching, rash.  Neurological: Negative for dizziness. Psychiatric/Behavioral: Negative for depression   Past Medical History  Diagnosis Date  . Cirrhosis of liver (HCC)     Sees Dr.  Sung Amabile  . Ascites   . Hypertension   . Gout X 1  . Narcolepsy   . Depression   . Edema of both legs     Takes Lasix  . Rheumatic fever   . Avascular necrosis of bone of left hip (HCC) 07/08/2015  . High cholesterol   . Sleep apnea     pt stated "I have sleep apnea but I do not use the machine...Marland KitchenMarland KitchenMarland Kitchenit broke" (07/08/2015)  . Arthritis     "left knee" (07/08/2015)  . Brown recluse spider bite 1990s    "they thought I was gonna die then"   Past Surgical History  Procedure Laterality Date  . Cosmetic surgery  ~ 1997    "nose done, lip drop, chin lift"  . Total knee arthroplasty Left 03/2008  . US guided paracentesis ([facility] hx)  April 2017  . Total hip arthroplasty Left 07/08/2015  . Tonsillectomy  1954  . Inguinal hernia repair Bilateral 1990s  . Foot fracture surgery Right   . Fracture surgery    . Total hip arthroplasty Left 07/08/2015    Procedure: LEFT TOTAL HIP ARTHROPLASTY;  Surgeon: Teryl Lucy, MD;  Location: [facility] OR;  Service: Orthopedics;  Laterality: Left;   Social History:   reports that he has never smoked. He has never used smokeless tobacco. He reports that he drinks alcohol. He reports that he uses illicit drugs (Marijuana).  Family History  Problem Relation Age of Onset  . Melanoma Father   . Lung cancer Father     Medications:   Medication List       This list is accurate as of:  07/14/15 11:52 AM.  Always use your most recent med list.               atorvastatin 10 MG tablet  Commonly known as:  LIPITOR  Take 1 tablet (10 mg total) by mouth daily.     baclofen 10 MG tablet  Commonly known as:  LIORESAL  Take 1 tablet (10 mg total) by mouth 3 (three) times daily. As needed for muscle spasm     buPROPion 150 MG 24 hr tablet  Commonly known as:  WELLBUTRIN XL  Take 1 tablet (150 mg total) by mouth daily.     furosemide 40 MG tablet  Commonly known as:  LASIX  Take 1 tablet (40 mg total) by mouth daily.     Lactulose 20 GM/30ML Soln  Take 30  mLs (20 g total) by mouth 2 (two) times daily. HOLD FOR > 3 bowel movements /day     losartan-hydrochlorothiazide 50-12.5 MG tablet  Commonly known as:  HYZAAR  Take 1 tablet by mouth daily.     multivitamin with minerals Tabs tablet  Take 1 tablet by mouth daily.     ondansetron 4 MG tablet  Commonly known as:  ZOFRAN  Take 1 tablet (4 mg total) by mouth every 8 (eight) hours as needed for nausea or vomiting.     Oxycodone HCl 10 MG Tabs  Take 1 tablet (10 mg total) by mouth 4 (four) times daily as needed (severe pain).     potassium chloride SA 20 MEQ tablet  Commonly known as:  K-DUR,KLOR-CON  Take 1 tablet (20 mEq total) by mouth daily.     rivaroxaban 10 MG Tabs tablet  Commonly known as:  XARELTO  Take 1 tablet (10 mg total) by mouth daily.     sennosides-docusate sodium 8.6-50 MG tablet  Commonly known as:  SENOKOT-S  Take 2 tablets by mouth daily.     spironolactone 25 MG tablet  Commonly known as:  ALDACTONE  Take 1 tablet (25 mg total) by mouth daily.        Immunizations:  There is no immunization history on file for this patient.   Physical Exam: Filed Vitals:   07/14/15 1143  BP: 108/65  Pulse: 95  Temp: 97.6 F (36.4 C)  TempSrc: Oral  Resp: 20  Height: 5\' 10"  (1.778 m)  Weight: 201 lb 8 oz (91.4 kg)  SpO2: 96%   Body mass index is 28.91 kg/(m^2).  General- elderly male, overweight, in no acute distress Head- normocephalic, atraumatic Nose- no maxillary or frontal sinus tenderness, no nasal discharge Throat- moist mucus membrane  Eyes- PERRLA, EOMI, no pallor, no icterus, no discharge Neck- no cervical lymphadenopathy Cardiovascular- normal s1,s2, no murmur, + leg edema Respiratory- CTAB, no wheeze, no rhonchi, no crackles, no use of accessory muscles Abdomen- bowel sounds present, soft, non tender Musculoskeletal- able to move all 4 extremities, generalized weakness, limited left hip range of motion Neurological- alert and oriented to  person, place and time Skin- warm and dry, left hip surgical incision with steri strips and dressing in place with some serosanginous drainage Psychiatry- normal mood and affect    Labs reviewed: Basic Metabolic Panel:  Recent Labs  78/29/5605/19/17 0836 07/09/15 0526 07/10/15 0400  NA 138 133* 133*  K 4.2 4.4 4.3  CL 105 104 105  CO2 25 23 24   GLUCOSE 101* 140* 111*  BUN 16 15 21*  CREATININE 1.34* 1.19 1.14  CALCIUM 9.2 8.4* 8.1*   Liver  Function Tests:  Recent Labs  05/19/15 1455 05/20/15 0515 06/27/15 0836  AST 50* 44* 44*  ALT 23 20 23   ALKPHOS 88 71 62  BILITOT 1.5* 1.5* 1.2  PROT 6.7 5.9* 6.8  ALBUMIN 2.7* 2.3* 2.5*    Recent Labs  05/19/15 1455  LIPASE 28    Recent Labs  05/20/15 1245  AMMONIA 52*   CBC:  Recent Labs  07/09/15 0526 07/10/15 0400 07/11/15 0333  WBC 24.4* 17.2* 15.0*  HGB 8.6* 7.0* 9.3*  HCT 26.3* 21.6* 28.5*  MCV 94.3 95.2 96.0  PLT 146* 117* 137*    Radiological Exams: Dg Pelvis Portable  07/08/2015  ADDENDUM REPORT: 07/08/2015 13:16 ADDENDUM: Cross table lateral radiograph of the proximal left femur was also acquired. This demonstrates the femoral component of the left hip arthroplasty device. No complications identified. Electronically Signed   By: Signa Kell M.D.   On: 07/08/2015 13:16  07/08/2015  CLINICAL DATA:  Left total hip arthroplasty. EXAM: PORTABLE PELVIS 1-2 VIEWS COMPARISON:  None. FINDINGS: Status post left total hip arthroplasty. The hardware components are in anatomic alignment. No complicating features identified. No dislocation or periprosthetic fracture identified. IMPRESSION: 1. No complications after left hip arthroplasty. Electronically Signed: By: Signa Kell M.D. On: 07/08/2015 11:01    Assessment/Plan  Unsteady gait Post hip surgery, Will have patient work with PT/OT as tolerated to regain strength and restore function.  Fall precautions are in place.  Left hip avascular necrosis S/p left  total hip arthroplasty. Has orthopedic follow up. Will have him work with physical therapy and occupational therapy team to help with gait training and muscle strengthening exercises.fall precautions. Skin care. Encourage to be out of bed. Continue baclofen 10 mg tid prn muscle spasm. Continue oxyIR 10 mg q4 hr prn pain. Continue xarelto for dvt prophylaxis.   Leukocytosis Afebrile continue surgical incision dressing change and monitor for signs of infection. Monitor wbc and temp curve  Blood loss anemia S/p 2 u prbc transfusion. Post op, monitor cbc  Thrombocytopenia With hx of liver cirrhosis, no bleed reported, monitor labs  Protein calorie malnutrition Get dietary consult, has liver cirrhosis. Continue MVI  HLD Continue statin  Depression Stable on wellbutrin  Leg edema Continue lasix 40 mg daily and monitor bmp  Liver cirrhosis Continue lactulose 20 g bid with goal of 2-3 bowel movement a day. Continue lasix and aldactone to help with edema and ascites  HTN Monitor bp, continue lisinopril-hctz and lasix, monitor bp  Hypokalemia On lasix, continue kcl supplement, monitor bmp  Constipation Patient would like his senokot s changed to 1 tab daily, will make changes, hydration to be maintained   Goals of care: short term rehabilitation   Labs/tests ordered: cbc, cmp  Family/ staff Communication: reviewed care plan with patient and nursing supervisor    Oneal Grout, MD Internal Medicine [facility] [address] [place], [place] [address] Cell Phone (Monday-Friday 8 am - 5 pm): [phone] On Call: [phone] and follow prompts after 5 pm and on weekends Office Phone: [phone] Office Fax: [phone]

## 2015-07-15 LAB — CBC AND DIFFERENTIAL
HCT: 28 % — AB (ref 41–53)
Hemoglobin: 8.8 g/dL — AB (ref 13.5–17.5)
Platelets: 191 10*3/uL (ref 150–399)
WBC: 8.9 10^3/mL

## 2015-07-15 LAB — HEPATIC FUNCTION PANEL
ALK PHOS: 67 U/L (ref 25–125)
ALT: 18 U/L (ref 10–40)
AST: 43 U/L — AB (ref 14–40)
Bilirubin, Total: 1.4 mg/dL

## 2015-07-15 LAB — BASIC METABOLIC PANEL
BUN: 21 mg/dL (ref 4–21)
CREATININE: 1 mg/dL (ref 0.6–1.3)
Glucose: 139 mg/dL
POTASSIUM: 4.3 mmol/L (ref 3.4–5.3)
SODIUM: 134 mmol/L — AB (ref 137–147)

## 2015-07-17 ENCOUNTER — Encounter: Payer: Self-pay | Admitting: Family

## 2015-07-17 ENCOUNTER — Non-Acute Institutional Stay (SKILLED_NURSING_FACILITY): Payer: Commercial Managed Care - HMO | Admitting: Family

## 2015-07-17 DIAGNOSIS — R600 Localized edema: Secondary | ICD-10-CM | POA: Diagnosis not present

## 2015-07-17 DIAGNOSIS — D509 Iron deficiency anemia, unspecified: Secondary | ICD-10-CM

## 2015-07-17 DIAGNOSIS — E46 Unspecified protein-calorie malnutrition: Secondary | ICD-10-CM

## 2015-07-17 NOTE — Progress Notes (Signed)
Location:  [facility] Room Number: 605 Place of Service:  SNF [phone]) Provider:  Richarda Blade, NP  BABAOFF, Lavada Mesi, MD  Patient Care Team: Kandyce Rud, MD as PCP - General (Family Medicine)  Extended Emergency Contact Information Primary Emergency Contact: Pagett,Charlotte  [place] Home Phone: [phone] Relation: Sister Secondary Emergency Contact: [place] Address: [address]          [place], [place] [address] [place] Home Phone: [phone] Relation: Mother  Code Status: Full Code Goals of care: Advanced Directive information Advanced Directives 07/17/2015  Does patient have an advance directive? No  Would patient like information on creating an advanced directive? -     Chief Complaint  Patient presents with  . Acute Visit    Follow up on Abnorma Labs    HPI:  Pt is a 70 y.o. male seen today  At [facility] for an acute visit for abnormal lab results. He is s/p left hip Arthroplasty. He is seen in his room today. He states left hip pain is under controlled.He complains of left lower leg swelling and pain. Ted hose not in place. He has been elevating left leg in bed. His recent lab results Hgb 8.8, HCT 27.9, Total Protein 5.5, Alb 2.21, ALT 43 (07/15/15).      Past Medical History  Diagnosis Date  . Cirrhosis of liver (HCC)     Sees Dr. Sung Amabile  . Ascites   . Hypertension   . Gout X 1  . Narcolepsy   . Depression   . Edema of both legs     Takes Lasix  . Rheumatic fever   . Avascular necrosis of bone of left hip (HCC) 07/08/2015  . High cholesterol   . Sleep apnea     pt stated "I have sleep apnea but I do not use the machine...Marland KitchenMarland KitchenMarland Kitchenit broke" (07/08/2015)  . Arthritis     "left knee" (07/08/2015)  . Brown recluse spider bite 1990s    "they thought I was gonna die then"   Past Surgical History  Procedure Laterality Date  . Cosmetic surgery  ~ 1997    "nose done, lip drop,  chin lift"  . Total knee arthroplasty Left 03/2008  . US guided paracentesis ([facility] hx)  April 2017  . Total hip arthroplasty Left 07/08/2015  . Tonsillectomy  1954  . Inguinal hernia repair Bilateral 1990s  . Foot fracture surgery Right   . Fracture surgery    . Total hip arthroplasty Left 07/08/2015    Procedure: LEFT TOTAL HIP ARTHROPLASTY;  Surgeon: Teryl Lucy, MD;  Location: [facility] OR;  Service: Orthopedics;  Laterality: Left;    No Known Allergies    Medication List       This list is accurate as of: 07/17/15 11:37 AM.  Always use your most recent med list.               atorvastatin 10 MG tablet  Commonly known as:  LIPITOR  Take 1 tablet (10 mg total) by mouth daily.     baclofen 10 MG tablet  Commonly known as:  LIORESAL  Take 1 tablet (10 mg total) by mouth 3 (three) times daily. As needed for muscle spasm     buPROPion 150 MG 24 hr tablet  Commonly known as:  WELLBUTRIN XL  Take 1 tablet (150 mg total) by mouth daily.     furosemide 40 MG tablet  Commonly known as:  LASIX  Take 1 tablet (40 mg total) by mouth daily.     Lactulose 20 GM/30ML Soln  Take 30 mLs (20 g total) by mouth 2 (two) times daily. HOLD FOR > 3 bowel movements /day     losartan-hydrochlorothiazide 50-12.5 MG tablet  Commonly known as:  HYZAAR  Take 1 tablet by mouth daily.     multivitamin with minerals Tabs tablet  Take 1 tablet by mouth daily.     ondansetron 4 MG tablet  Commonly known as:  ZOFRAN  Take 1 tablet (4 mg total) by mouth every 8 (eight) hours as needed for nausea or vomiting.     Oxycodone HCl 10 MG Tabs  Take 1 tablet (10 mg total) by mouth 4 (four) times daily as needed (severe pain).     potassium chloride SA 20 MEQ tablet  Commonly known as:  K-DUR,KLOR-CON  Take 1 tablet (20 mEq total) by mouth daily.     rivaroxaban 10 MG Tabs tablet  Commonly known as:  XARELTO  Take 1 tablet (10 mg total) by mouth daily.     sennosides-docusate sodium 8.6-50 MG tablet    Commonly known as:  SENOKOT-S  Take 1 tablet by mouth at bedtime.     spironolactone 25 MG tablet  Commonly known as:  ALDACTONE  Take 1 tablet (25 mg total) by mouth daily.        Review of Systems  Constitutional: Negative for fever, chills, activity change, appetite change and fatigue.  Respiratory: Negative for cough, chest tightness, shortness of breath and wheezing.   Cardiovascular: Positive for leg swelling. Negative for chest pain and palpitations.  Gastrointestinal: Negative for nausea, vomiting, abdominal pain, diarrhea, constipation and abdominal distention.  Genitourinary: Negative.   Musculoskeletal: Positive for gait problem.       Left leg pain   Skin: Negative for color change, rash and wound.       Left surgical incision   Neurological: Negative for dizziness, light-headedness and headaches.  Psychiatric/Behavioral: Negative.      There is no immunization history on file for this patient. Pertinent  Health Maintenance Due  Topic Date Due  . COLONOSCOPY  07/09/1996  . PNA vac Low Risk Adult (1 of 2 - PCV13) 07/10/2011  . INFLUENZA VACCINE  09/09/2015   No flowsheet data found. Functional Status Survey:    Filed Vitals:   07/17/15 1115  BP: 142/77  Pulse: 82  Temp: 98.1 F (36.7 C)  Resp: 20  Height: 5\' 10"  (1.778 m)  Weight: 201 lb 8 oz (91.4 kg)  SpO2: 97%   Body mass index is 28.91 kg/(m^2). Physical Exam  Constitutional: He is oriented to person, place, and time. He appears well-developed and well-nourished. No distress.  HENT:  Head: Normocephalic.  Mouth/Throat: Oropharynx is clear and moist. No oropharyngeal exudate.  Eyes: Conjunctivae and EOM are normal. Pupils are equal, round, and reactive to light. Right eye exhibits no discharge. Left eye exhibits no discharge. No scleral icterus.  Neck: Normal range of motion. No JVD present. No thyromegaly present.  Cardiovascular: Normal rate, regular rhythm, normal heart sounds and intact  distal pulses.  Exam reveals no gallop and no friction rub.   No murmur heard. Pulmonary/Chest: Effort normal and breath sounds normal. No respiratory distress. He has no wheezes. He has no rales.  Abdominal: Soft. Bowel sounds are normal. He exhibits no distension. There is no tenderness. There is no rebound and no guarding.  Musculoskeletal: He  exhibits no tenderness.  Unsteady gait. Left leg non-pitting edema.   Lymphadenopathy:    He has no cervical adenopathy.  Neurological: He is oriented to person, place, and time.  Skin: Skin is warm and dry. No rash noted. He is not diaphoretic. No erythema. No pallor.  Left hip surgical incision dry, clean and intact.   Psychiatric: He has a normal mood and affect.    Labs reviewed:  Recent Labs  06/27/15 0836 07/09/15 0526 07/10/15 0400  NA 138 133* 133*  K 4.2 4.4 4.3  CL 105 104 105  CO2 25 23 24   GLUCOSE 101* 140* 111*  BUN 16 15 21*  CREATININE 1.34* 1.19 1.14  CALCIUM 9.2 8.4* 8.1*    Recent Labs  05/19/15 1455 05/20/15 0515 06/27/15 0836  AST 50* 44* 44*  ALT 23 20 23   ALKPHOS 88 71 62  BILITOT 1.5* 1.5* 1.2  PROT 6.7 5.9* 6.8  ALBUMIN 2.7* 2.3* 2.5*    Recent Labs  07/09/15 0526 07/10/15 0400 07/11/15 0333  WBC 24.4* 17.2* 15.0*  HGB 8.6* 7.0* 9.3*  HCT 26.3* 21.6* 28.5*  MCV 94.3 95.2 96.0  PLT 146* 117* 137*   Lab Results  Component Value Date   TSH 1.25 10/23/2013   No results found for: HGBA1C No results found for: CHOL, HDL, LDLCALC, LDLDIRECT, TRIG, CHOLHDL  Significant Diagnostic Results in last 30 days:  Dg Pelvis Portable  07/08/2015  ADDENDUM REPORT: 07/08/2015 13:16 ADDENDUM: Cross table lateral radiograph of the proximal left femur was also acquired. This demonstrates the femoral component of the left hip arthroplasty device. No complications identified. Electronically Signed   By: Signa Kell M.D.   On: 07/08/2015 13:16  07/08/2015  CLINICAL DATA:  Left total hip arthroplasty. EXAM:  PORTABLE PELVIS 1-2 VIEWS COMPARISON:  None. FINDINGS: Status post left total hip arthroplasty. The hardware components are in anatomic alignment. No complicating features identified. No dislocation or periprosthetic fracture identified. IMPRESSION: 1. No complications after left hip arthroplasty. Electronically Signed: By: Signa Kell M.D. On: 07/08/2015 11:01    Assessment/Plan Anemia  Hgb 8.8 ( 07/15/2015) previous 9.3  . Received blood transfusion post left hip arthroplasty. Start Ferrous sulfate 325 mg Tablet one by mouth once daily. Recheck CBC 07/24/2015  Protein calorie malnutrition  TP 5.5, Alb 2.21. Appetite has improved post left hip arthroplasty. Consult RD for protein supplement. BMP 07/24/2015  Edema  S/p left hip Arthroplasty. Left leg non-pitting edema with pain. Ted hose off during visit. Will obtain Duplex studies r/o DVT   Family/ staff Communication:Reviewed plan with patient and facility Nurse supervisor.  Labs/tests ordered:  CBC, BMP 07/24/2015

## 2015-07-18 DIAGNOSIS — M1612 Unilateral primary osteoarthritis, left hip: Secondary | ICD-10-CM | POA: Diagnosis not present

## 2015-07-23 ENCOUNTER — Non-Acute Institutional Stay (SKILLED_NURSING_FACILITY): Payer: Commercial Managed Care - HMO | Admitting: Family

## 2015-07-23 ENCOUNTER — Encounter: Payer: Self-pay | Admitting: Family

## 2015-07-23 DIAGNOSIS — D509 Iron deficiency anemia, unspecified: Secondary | ICD-10-CM

## 2015-07-23 DIAGNOSIS — E785 Hyperlipidemia, unspecified: Secondary | ICD-10-CM | POA: Diagnosis not present

## 2015-07-23 DIAGNOSIS — R269 Unspecified abnormalities of gait and mobility: Secondary | ICD-10-CM

## 2015-07-23 DIAGNOSIS — F329 Major depressive disorder, single episode, unspecified: Secondary | ICD-10-CM | POA: Insufficient documentation

## 2015-07-23 DIAGNOSIS — Z9660 Presence of unspecified orthopedic joint implant: Secondary | ICD-10-CM

## 2015-07-23 DIAGNOSIS — F32A Depression, unspecified: Secondary | ICD-10-CM

## 2015-07-23 DIAGNOSIS — Z96649 Presence of unspecified artificial hip joint: Secondary | ICD-10-CM

## 2015-07-23 DIAGNOSIS — I10 Essential (primary) hypertension: Secondary | ICD-10-CM | POA: Diagnosis not present

## 2015-07-23 DIAGNOSIS — K7031 Alcoholic cirrhosis of liver with ascites: Secondary | ICD-10-CM

## 2015-07-23 NOTE — Progress Notes (Signed)
Patient ID: Lawrence West, male   DOB: 09-Jun-1945, 70 y.o.   MRN: [id number]  Location:   [facility]  Nursing Home Room Number: 605 Place of Service:  SNF (31)  Provider:Dinah Ngetich FNP-C   PCP: Rozanna Box, MD Patient Care Team: Kandyce Rud, MD as PCP - General (Family Medicine)  Extended Emergency Contact Information Primary Emergency Contact: Pagett,Charlotte  [place] Home Phone: [phone] Relation: Sister Secondary Emergency Contact: [place] Address: [address]          [place], [place] [address] [place] Home Phone: [phone] Relation: Mother  Code Status:Full Code  Goals of care:  Advanced Directive information Advanced Directives 07/23/2015  Does patient have an advance directive? No  Would patient like information on creating an advanced directive? -     No Known Allergies  Chief Complaint  Patient presents with  . Discharge Note    HPI:  70 y.o. male seen today at  [facility] for discharge home. He was here for short term rehabilitation post hospital admission from 07/08/15-07/11/15 with avascular necrosis of bone of left hip. He underwent total hip arthroplasty on 07/08/15.He received 2 units of PRBC in the hospital. 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 His pain is under control with current pain regimen. He denies any acute issues this visit. He has worked well with PT/OT now stable for discharge home.He will be discharged home with Home health PT/OT to continue with ROM, Exercise, Gait stability  and muscle strengthening. He does not require any DME states has own walker. Home health services will be arranged by facility social worker prior to discharge. Prescription medication will be written x 1 month then patient to follow up with PCP in 1-2 weeks.       Past Medical History  Diagnosis Date  .  Cirrhosis of liver (HCC)     Sees Dr. Sung Amabile  . Ascites   . Hypertension   . Gout X 1  . Narcolepsy   . Depression   . Edema of both legs     Takes Lasix  . Rheumatic fever   . Avascular necrosis of bone of left hip (HCC) 07/08/2015  . High cholesterol   . Sleep apnea     pt stated "I have sleep apnea but I do not use the machine...Marland KitchenMarland KitchenMarland Kitchenit broke" (07/08/2015)  . Arthritis     "left knee" (07/08/2015)  . Brown recluse spider bite 1990s    "they thought I was gonna die then"    Past Surgical History  Procedure Laterality Date  . Cosmetic surgery  ~ 1997    "nose done, lip drop, chin lift"  . Total knee arthroplasty Left 03/2008  . US guided paracentesis ([facility] h[facility])  April 2017  . Total hip arthroplasty Left 07/08/2015  . Tonsillectomy  1954  . Inguinal hernia repair Bilateral 1990s  . Foot fracture surgery Right   . Fracture surgery    . Total hip arthroplasty Left 07/08/2015    Procedure: LEFT TOTAL HIP ARTHROPLASTY;  Surgeon: Teryl Lucy, MD;  Location: [facility] OR;  Service: Orthopedics;  Laterality: Left;      reports that he has never smoked. He has never used smokeless tobacco. He reports that he drinks alcohol. He reports that he uses illicit drugs (Marijuana). Social History   Social History  . Marital Status:  Single    Spouse Name: N/A  . Number of Children: N/A  . Years of Education: N/A   Occupational History  . Not on file.   Social History Main Topics  . Smoking status: Never Smoker   . Smokeless tobacco: Never Used  . Alcohol Use: 0.0 oz/week    0 Standard drinks or equivalent per week     Comment: 07/08/2015 former Heavy ETOH abuse.; "haven't had a drink in 2017; AA told me I didn't need to come back"  . Drug Use: Yes    Special: Marijuana     Comment: 07/08/2015 "just in my early 20s"  . Sexual Activity: No   Other Topics Concern  . Not on file   Social History Narrative   Functional Status Survey:    No Known Allergies  Pertinent  Health  Maintenance Due  Topic Date Due  . COLONOSCOPY  07/09/1996  . PNA vac Low Risk Adult (1 of 2 - PCV13) 07/10/2011  . INFLUENZA VACCINE  09/09/2015    Medications:   Medication List       This list is accurate as of: 07/23/15  4:43 PM.  Always use your most recent med list.               atorvastatin 10 MG tablet  Commonly known as:  LIPITOR  Take 1 tablet (10 mg total) by mouth daily.     baclofen 10 MG tablet  Commonly known as:  LIORESAL  Take 1 tablet (10 mg total) by mouth 3 (three) times daily. As needed for muscle spasm     buPROPion 150 MG 24 hr tablet  Commonly known as:  WELLBUTRIN XL  Take 1 tablet (150 mg total) by mouth daily.     ferrous sulfate 325 (65 FE) MG tablet  Take 325 mg by mouth daily with breakfast.     furosemide 40 MG tablet  Commonly known as:  LASIX  Take 1 tablet (40 mg total) by mouth daily.     Lactulose 20 GM/30ML Soln  Take 30 mLs (20 g total) by mouth 2 (two) times daily. HOLD FOR > 3 bowel movements /day     losartan-hydrochlorothiazide 50-12.5 MG tablet  Commonly known as:  HYZAAR  Take 1 tablet by mouth daily.     multivitamin with minerals Tabs tablet  Take 1 tablet by mouth daily.     ondansetron 4 MG tablet  Commonly known as:  ZOFRAN  Take 1 tablet (4 mg total) by mouth every 8 (eight) hours as needed for nausea or vomiting.     Oxycodone HCl 10 MG Tabs  Take 1 tablet (10 mg total) by mouth 4 (four) times daily as needed (severe pain).     potassium chloride SA 20 MEQ tablet  Commonly known as:  K-DUR,KLOR-CON  Take 1 tablet (20 mEq total) by mouth daily.     rivaroxaban 10 MG Tabs tablet  Commonly known as:  XARELTO  Take 1 tablet (10 mg total) by mouth daily.     spironolactone 25 MG tablet  Commonly known as:  ALDACTONE  Take 1 tablet (25 mg total) by mouth daily.        Review of Systems  Constitutional: Negative for fever, chills, activity change, appetite change and fatigue.  HENT: Negative for  congestion, rhinorrhea, sinus pressure, sneezing and sore throat.   Eyes: Negative.   Respiratory: Negative for cough, chest tightness, shortness of breath and wheezing.   Cardiovascular: Negative for  chest pain and palpitations.  Gastrointestinal: Negative for nausea, vomiting, abdominal pain, diarrhea, constipation and abdominal distention.  Endocrine: Negative.   Genitourinary: Negative.   Musculoskeletal: Positive for gait problem.       Left leg pain   Skin: Negative for color change, rash and wound.       Left surgical incision   Neurological: Negative for dizziness, seizures, light-headedness and headaches.  Hematological: Does not bruise/bleed easily.  Psychiatric/Behavioral: Negative.     Filed Vitals:   07/23/15 1527  BP: 93/57  Pulse: 89  Temp: 97.9 F (36.6 C)  TempSrc: Oral  Resp: 24  Height: 5\' 10"  (1.778 m)  Weight: 201 lb 8 oz (91.4 kg)  SpO2: 93%   Body mass index is 28.91 kg/(m^2). Physical Exam  Constitutional: He is oriented to person, place, and time. He appears well-developed and well-nourished. No distress.  HENT:  Head: Normocephalic.  Mouth/Throat: Oropharynx is clear and moist. No oropharyngeal exudate.  Eyes: Conjunctivae and EOM are normal. Pupils are equal, round, and reactive to light. Right eye exhibits no discharge. Left eye exhibits no discharge. No scleral icterus.  Neck: Normal range of motion. No JVD present. No thyromegaly present.  Cardiovascular: Normal rate, regular rhythm, normal heart sounds and intact distal pulses.  Exam reveals no gallop and no friction rub.   No murmur heard. Pulmonary/Chest: Effort normal and breath sounds normal. No respiratory distress. He has no wheezes. He has no rales.  Abdominal: Soft. Bowel sounds are normal. He exhibits no distension. There is no tenderness. There is no rebound and no guarding.  Genitourinary:  Continent   Musculoskeletal: He exhibits no tenderness.  Unsteady gait. Left leg edema has  improved.   Lymphadenopathy:    He has no cervical adenopathy.  Neurological: He is oriented to person, place, and time.  Skin: Skin is warm and dry. No rash noted. He is not diaphoretic. No erythema. No pallor.  Left hip surgical incision dry, clean and intact.   Psychiatric: He has a normal mood and affect.    Labs reviewed: Basic Metabolic Panel:  Recent Labs  16/10/96 0836 07/09/15 0526 07/10/15 0400 07/15/15  NA 138 133* 133* 134*  K 4.2 4.4 4.3 4.3  CL 105 104 105  --   CO2 25 23 24   --   GLUCOSE 101* 140* 111*  --   BUN 16 15 21* 21  CREATININE 1.34* 1.19 1.14 1.0  CALCIUM 9.2 8.4* 8.1*  --    Liver Function Tests:  Recent Labs  05/19/15 1455 05/20/15 0515 06/27/15 0836 07/15/15  AST 50* 44* 44* 43*  ALT 23 20 23 18   ALKPHOS 88 71 62 67  BILITOT 1.5* 1.5* 1.2  --   PROT 6.7 5.9* 6.8  --   ALBUMIN 2.7* 2.3* 2.5*  --     Recent Labs  05/19/15 1455  LIPASE 28    Recent Labs  05/20/15 1245  AMMONIA 52*   CBC:  Recent Labs  07/09/15 0526 07/10/15 0400 07/11/15 0333 07/15/15  WBC 24.4* 17.2* 15.0* 8.9  HGB 8.6* 7.0* 9.3* 8.8*  HCT 26.3* 21.6* 28.5* 28*  MCV 94.3 95.2 96.0  --   PLT 146* 117* 137* 191   Cardiac Enzymes: No results for input(s): CKTOTAL, CKMB, CKMBINDEX, TROPONINI in the last 8760 hours. BNP: Invalid input(s): POCBNP CBG: No results for input(s): GLUCAP in the last 8760 hours.  Procedures and Imaging Studies During Stay: Dg Pelvis Portable  07/08/2015  ADDENDUM REPORT: 07/08/2015 13:16  ADDENDUM: Cross table lateral radiograph of the proximal left femur was also acquired. This demonstrates the femoral component of the left hip arthroplasty device. No complications identified. Electronically Signed   By: Signa Kellaylor  Stroud M.D.   On: 07/08/2015 13:16  07/08/2015  CLINICAL DATA:  Left total hip arthroplasty. EXAM: PORTABLE PELVIS 1-2 VIEWS COMPARISON:  None. FINDINGS: Status post left total hip arthroplasty. The hardware components  are in anatomic alignment. No complicating features identified. No dislocation or periprosthetic fracture identified. IMPRESSION: 1. No complications after left hip arthroplasty. Electronically Signed: By: Signa Kellaylor  Stroud M.D. On: 07/08/2015 11:01    Assessment/Plan:   1. Hyperlipidemia Continue on Lipitor 10 mg tablet. Lipid panel with PCP  2. Depression No mood changes. Continue on Bupropion 150 mg Tablet.   3. S/P total hip arthroplasty Statue post short term rehabilitation post hospital admission from 07/08/15-07/11/15 with avascular necrosis of bone of left hip. He underwent total hip arthroplasty on 07/08/15.He received 2 units of PRBC in the hospital. has worked well with PT/OT now stable for discharge home.He will be discharged home with Home health PT/OT to continue with ROM, Exercise, Gait stability  and muscle strengthening. He does not   require any  DME states has own walker.continue on Xarelto for DVT prophylaxis.continue current pain regimen. Follow up with Ortho as directed.   4. Abnormality of gait S/p left total hip Arthroplasty. will be discharged home with Home health PT/OT to continue with ROM, Exercise, Gait stability  and muscle strengthening.Fall and safety precautions.   5. Ascites due to alcoholic cirrhosis (HCC) Continue furosemide 40 mg Tablet and Aldactone 25 mg Tablet.   6. HTN B/p stable. Continue on Hyzaar 50-12.5 mg Tablet and Aldactone 25 mg tablet.   7. Anemia  Continue on ferrous sulfate 325 mg tablet CBC in 1-2 weeks with PCP     Patient is being discharged with the following home health services:    PT/OT to continue with ROM, Exercise, Gait stability  and muscle strengthening.  Patient is being discharged with the following durable medical equipment:   No DME required has own walker.    Prescription medication written x 1 month supply.  Patient has been advised to f/u with their PCP in 1-2 weeks to bring them up to date on their rehab stay.   Social services at facility was responsible for arranging this appointment.  Pt was provided with a 30 day supply of prescriptions for medications and refills must be obtained from their PCP.  For controlled substances, a more limited supply may be provided adequate until PCP appointment only.  Future labs/tests needed: CBC, BMP in 1-2 weeks with PCP

## 2015-07-28 DIAGNOSIS — I10 Essential (primary) hypertension: Secondary | ICD-10-CM | POA: Diagnosis not present

## 2015-07-28 DIAGNOSIS — M1990 Unspecified osteoarthritis, unspecified site: Secondary | ICD-10-CM | POA: Diagnosis not present

## 2015-07-28 DIAGNOSIS — Z471 Aftercare following joint replacement surgery: Secondary | ICD-10-CM | POA: Diagnosis not present

## 2015-07-28 DIAGNOSIS — M109 Gout, unspecified: Secondary | ICD-10-CM | POA: Diagnosis not present

## 2015-07-28 DIAGNOSIS — M6281 Muscle weakness (generalized): Secondary | ICD-10-CM | POA: Diagnosis not present

## 2015-07-28 DIAGNOSIS — K7031 Alcoholic cirrhosis of liver with ascites: Secondary | ICD-10-CM | POA: Diagnosis not present

## 2015-07-29 DIAGNOSIS — M1990 Unspecified osteoarthritis, unspecified site: Secondary | ICD-10-CM | POA: Diagnosis not present

## 2015-07-29 DIAGNOSIS — I10 Essential (primary) hypertension: Secondary | ICD-10-CM | POA: Diagnosis not present

## 2015-07-29 DIAGNOSIS — Z471 Aftercare following joint replacement surgery: Secondary | ICD-10-CM | POA: Diagnosis not present

## 2015-07-29 DIAGNOSIS — K7031 Alcoholic cirrhosis of liver with ascites: Secondary | ICD-10-CM | POA: Diagnosis not present

## 2015-07-29 DIAGNOSIS — M6281 Muscle weakness (generalized): Secondary | ICD-10-CM | POA: Diagnosis not present

## 2015-07-29 DIAGNOSIS — M109 Gout, unspecified: Secondary | ICD-10-CM | POA: Diagnosis not present

## 2015-07-31 DIAGNOSIS — M1990 Unspecified osteoarthritis, unspecified site: Secondary | ICD-10-CM | POA: Diagnosis not present

## 2015-07-31 DIAGNOSIS — I10 Essential (primary) hypertension: Secondary | ICD-10-CM | POA: Diagnosis not present

## 2015-07-31 DIAGNOSIS — Z471 Aftercare following joint replacement surgery: Secondary | ICD-10-CM | POA: Diagnosis not present

## 2015-07-31 DIAGNOSIS — M109 Gout, unspecified: Secondary | ICD-10-CM | POA: Diagnosis not present

## 2015-07-31 DIAGNOSIS — K7031 Alcoholic cirrhosis of liver with ascites: Secondary | ICD-10-CM | POA: Diagnosis not present

## 2015-07-31 DIAGNOSIS — M6281 Muscle weakness (generalized): Secondary | ICD-10-CM | POA: Diagnosis not present

## 2015-08-04 DIAGNOSIS — M6281 Muscle weakness (generalized): Secondary | ICD-10-CM | POA: Diagnosis not present

## 2015-08-04 DIAGNOSIS — Z471 Aftercare following joint replacement surgery: Secondary | ICD-10-CM | POA: Diagnosis not present

## 2015-08-04 DIAGNOSIS — M109 Gout, unspecified: Secondary | ICD-10-CM | POA: Diagnosis not present

## 2015-08-04 DIAGNOSIS — K7031 Alcoholic cirrhosis of liver with ascites: Secondary | ICD-10-CM | POA: Diagnosis not present

## 2015-08-04 DIAGNOSIS — I10 Essential (primary) hypertension: Secondary | ICD-10-CM | POA: Diagnosis not present

## 2015-08-04 DIAGNOSIS — M1990 Unspecified osteoarthritis, unspecified site: Secondary | ICD-10-CM | POA: Diagnosis not present

## 2015-08-05 DIAGNOSIS — M109 Gout, unspecified: Secondary | ICD-10-CM | POA: Diagnosis not present

## 2015-08-05 DIAGNOSIS — M6281 Muscle weakness (generalized): Secondary | ICD-10-CM | POA: Diagnosis not present

## 2015-08-05 DIAGNOSIS — M1990 Unspecified osteoarthritis, unspecified site: Secondary | ICD-10-CM | POA: Diagnosis not present

## 2015-08-05 DIAGNOSIS — K7031 Alcoholic cirrhosis of liver with ascites: Secondary | ICD-10-CM | POA: Diagnosis not present

## 2015-08-05 DIAGNOSIS — Z471 Aftercare following joint replacement surgery: Secondary | ICD-10-CM | POA: Diagnosis not present

## 2015-08-05 DIAGNOSIS — I10 Essential (primary) hypertension: Secondary | ICD-10-CM | POA: Diagnosis not present

## 2015-08-06 ENCOUNTER — Encounter: Admission: RE | Payer: Self-pay | Source: Ambulatory Visit

## 2015-08-06 ENCOUNTER — Ambulatory Visit
Admission: RE | Admit: 2015-08-06 | Payer: Commercial Managed Care - HMO | Source: Ambulatory Visit | Admitting: Unknown Physician Specialty

## 2015-08-06 SURGERY — COLONOSCOPY WITH PROPOFOL
Anesthesia: General

## 2015-08-07 DIAGNOSIS — I10 Essential (primary) hypertension: Secondary | ICD-10-CM | POA: Diagnosis not present

## 2015-08-07 DIAGNOSIS — M1990 Unspecified osteoarthritis, unspecified site: Secondary | ICD-10-CM | POA: Diagnosis not present

## 2015-08-07 DIAGNOSIS — M109 Gout, unspecified: Secondary | ICD-10-CM | POA: Diagnosis not present

## 2015-08-07 DIAGNOSIS — M6281 Muscle weakness (generalized): Secondary | ICD-10-CM | POA: Diagnosis not present

## 2015-08-07 DIAGNOSIS — K7031 Alcoholic cirrhosis of liver with ascites: Secondary | ICD-10-CM | POA: Diagnosis not present

## 2015-08-07 DIAGNOSIS — Z471 Aftercare following joint replacement surgery: Secondary | ICD-10-CM | POA: Diagnosis not present

## 2015-08-08 DIAGNOSIS — Z79899 Other long term (current) drug therapy: Secondary | ICD-10-CM | POA: Diagnosis not present

## 2015-08-08 DIAGNOSIS — K7031 Alcoholic cirrhosis of liver with ascites: Secondary | ICD-10-CM | POA: Diagnosis not present

## 2015-08-08 DIAGNOSIS — M1612 Unilateral primary osteoarthritis, left hip: Secondary | ICD-10-CM | POA: Diagnosis not present

## 2015-08-08 DIAGNOSIS — D62 Acute posthemorrhagic anemia: Secondary | ICD-10-CM | POA: Diagnosis not present

## 2015-08-15 DIAGNOSIS — M1612 Unilateral primary osteoarthritis, left hip: Secondary | ICD-10-CM | POA: Diagnosis not present

## 2015-09-02 DIAGNOSIS — I10 Essential (primary) hypertension: Secondary | ICD-10-CM | POA: Diagnosis not present

## 2015-09-02 DIAGNOSIS — K7031 Alcoholic cirrhosis of liver with ascites: Secondary | ICD-10-CM | POA: Diagnosis not present

## 2015-09-02 DIAGNOSIS — E7800 Pure hypercholesterolemia, unspecified: Secondary | ICD-10-CM | POA: Diagnosis not present

## 2015-09-02 DIAGNOSIS — Z125 Encounter for screening for malignant neoplasm of prostate: Secondary | ICD-10-CM | POA: Diagnosis not present

## 2015-09-02 DIAGNOSIS — R739 Hyperglycemia, unspecified: Secondary | ICD-10-CM | POA: Diagnosis not present

## 2015-09-02 DIAGNOSIS — Z79899 Other long term (current) drug therapy: Secondary | ICD-10-CM | POA: Diagnosis not present

## 2015-09-10 DIAGNOSIS — M1612 Unilateral primary osteoarthritis, left hip: Secondary | ICD-10-CM | POA: Diagnosis not present

## 2015-10-06 DIAGNOSIS — M1612 Unilateral primary osteoarthritis, left hip: Secondary | ICD-10-CM | POA: Diagnosis not present

## 2015-12-27 ENCOUNTER — Encounter: Payer: Self-pay | Admitting: Emergency Medicine

## 2015-12-27 ENCOUNTER — Inpatient Hospital Stay
Admission: EM | Admit: 2015-12-27 | Discharge: 2015-12-30 | DRG: 377 | Disposition: A | Discharge status: Skilled Nursing Facility | Payer: Commercial Managed Care - HMO | Attending: Internal Medicine | Admitting: Internal Medicine

## 2015-12-27 ENCOUNTER — Inpatient Hospital Stay: Payer: Commercial Managed Care - HMO | Admitting: Anesthesiology

## 2015-12-27 ENCOUNTER — Inpatient Hospital Stay: Payer: Commercial Managed Care - HMO

## 2015-12-27 ENCOUNTER — Ambulatory Visit: Admit: 2015-12-27 | Payer: Commercial Managed Care - HMO | Admitting: Gastroenterology

## 2015-12-27 ENCOUNTER — Encounter
Admission: EM | Disposition: A | Discharge status: Skilled Nursing Facility | Payer: Self-pay | Source: Home / Self Care | Attending: Internal Medicine

## 2015-12-27 DIAGNOSIS — N183 Chronic kidney disease, stage 3 (moderate): Secondary | ICD-10-CM | POA: Diagnosis present

## 2015-12-27 DIAGNOSIS — M6281 Muscle weakness (generalized): Secondary | ICD-10-CM

## 2015-12-27 DIAGNOSIS — D62 Acute posthemorrhagic anemia: Secondary | ICD-10-CM | POA: Diagnosis not present

## 2015-12-27 DIAGNOSIS — M1991 Primary osteoarthritis, unspecified site: Secondary | ICD-10-CM | POA: Diagnosis present

## 2015-12-27 DIAGNOSIS — Z9119 Patient's noncompliance with other medical treatment and regimen: Secondary | ICD-10-CM | POA: Diagnosis not present

## 2015-12-27 DIAGNOSIS — R188 Other ascites: Secondary | ICD-10-CM | POA: Diagnosis not present

## 2015-12-27 DIAGNOSIS — K767 Hepatorenal syndrome: Secondary | ICD-10-CM | POA: Diagnosis not present

## 2015-12-27 DIAGNOSIS — I959 Hypotension, unspecified: Secondary | ICD-10-CM | POA: Diagnosis not present

## 2015-12-27 DIAGNOSIS — M1712 Unilateral primary osteoarthritis, left knee: Secondary | ICD-10-CM | POA: Diagnosis present

## 2015-12-27 DIAGNOSIS — R42 Dizziness and giddiness: Secondary | ICD-10-CM | POA: Diagnosis not present

## 2015-12-27 DIAGNOSIS — D72829 Elevated white blood cell count, unspecified: Secondary | ICD-10-CM | POA: Diagnosis present

## 2015-12-27 DIAGNOSIS — Z6831 Body mass index (BMI) 31.0-31.9, adult: Secondary | ICD-10-CM

## 2015-12-27 DIAGNOSIS — M6218 Other rupture of muscle (nontraumatic), other site: Secondary | ICD-10-CM | POA: Diagnosis not present

## 2015-12-27 DIAGNOSIS — D631 Anemia in chronic kidney disease: Secondary | ICD-10-CM | POA: Diagnosis not present

## 2015-12-27 DIAGNOSIS — D696 Thrombocytopenia, unspecified: Secondary | ICD-10-CM | POA: Diagnosis not present

## 2015-12-27 DIAGNOSIS — Z96642 Presence of left artificial hip joint: Secondary | ICD-10-CM | POA: Diagnosis present

## 2015-12-27 DIAGNOSIS — Z7901 Long term (current) use of anticoagulants: Secondary | ICD-10-CM

## 2015-12-27 DIAGNOSIS — R278 Other lack of coordination: Secondary | ICD-10-CM | POA: Diagnosis not present

## 2015-12-27 DIAGNOSIS — F329 Major depressive disorder, single episode, unspecified: Secondary | ICD-10-CM | POA: Diagnosis present

## 2015-12-27 DIAGNOSIS — F1010 Alcohol abuse, uncomplicated: Secondary | ICD-10-CM | POA: Diagnosis present

## 2015-12-27 DIAGNOSIS — K254 Chronic or unspecified gastric ulcer with hemorrhage: Secondary | ICD-10-CM | POA: Diagnosis not present

## 2015-12-27 DIAGNOSIS — I10 Essential (primary) hypertension: Secondary | ICD-10-CM | POA: Diagnosis not present

## 2015-12-27 DIAGNOSIS — Z9114 Patient's other noncompliance with medication regimen: Secondary | ICD-10-CM

## 2015-12-27 DIAGNOSIS — G4733 Obstructive sleep apnea (adult) (pediatric): Secondary | ICD-10-CM | POA: Diagnosis present

## 2015-12-27 DIAGNOSIS — K449 Diaphragmatic hernia without obstruction or gangrene: Secondary | ICD-10-CM | POA: Diagnosis present

## 2015-12-27 DIAGNOSIS — J9811 Atelectasis: Secondary | ICD-10-CM | POA: Diagnosis not present

## 2015-12-27 DIAGNOSIS — R531 Weakness: Secondary | ICD-10-CM | POA: Diagnosis not present

## 2015-12-27 DIAGNOSIS — E7800 Pure hypercholesterolemia, unspecified: Secondary | ICD-10-CM | POA: Diagnosis present

## 2015-12-27 DIAGNOSIS — K7031 Alcoholic cirrhosis of liver with ascites: Secondary | ICD-10-CM | POA: Diagnosis not present

## 2015-12-27 DIAGNOSIS — K625 Hemorrhage of anus and rectum: Secondary | ICD-10-CM | POA: Diagnosis not present

## 2015-12-27 DIAGNOSIS — N179 Acute kidney failure, unspecified: Secondary | ICD-10-CM | POA: Diagnosis not present

## 2015-12-27 DIAGNOSIS — K921 Melena: Secondary | ICD-10-CM | POA: Diagnosis not present

## 2015-12-27 DIAGNOSIS — K259 Gastric ulcer, unspecified as acute or chronic, without hemorrhage or perforation: Secondary | ICD-10-CM | POA: Diagnosis not present

## 2015-12-27 DIAGNOSIS — Z7982 Long term (current) use of aspirin: Secondary | ICD-10-CM

## 2015-12-27 DIAGNOSIS — T39395A Adverse effect of other nonsteroidal anti-inflammatory drugs [NSAID], initial encounter: Secondary | ICD-10-CM | POA: Diagnosis present

## 2015-12-27 DIAGNOSIS — M109 Gout, unspecified: Secondary | ICD-10-CM | POA: Diagnosis present

## 2015-12-27 DIAGNOSIS — K929 Disease of digestive system, unspecified: Secondary | ICD-10-CM | POA: Diagnosis not present

## 2015-12-27 DIAGNOSIS — G4730 Sleep apnea, unspecified: Secondary | ICD-10-CM | POA: Diagnosis not present

## 2015-12-27 DIAGNOSIS — K922 Gastrointestinal hemorrhage, unspecified: Secondary | ICD-10-CM | POA: Diagnosis not present

## 2015-12-27 DIAGNOSIS — Z79899 Other long term (current) drug therapy: Secondary | ICD-10-CM | POA: Diagnosis not present

## 2015-12-27 DIAGNOSIS — K7030 Alcoholic cirrhosis of liver without ascites: Secondary | ICD-10-CM | POA: Diagnosis not present

## 2015-12-27 DIAGNOSIS — N19 Unspecified kidney failure: Secondary | ICD-10-CM | POA: Diagnosis not present

## 2015-12-27 DIAGNOSIS — R2681 Unsteadiness on feet: Secondary | ICD-10-CM

## 2015-12-27 DIAGNOSIS — I129 Hypertensive chronic kidney disease with stage 1 through stage 4 chronic kidney disease, or unspecified chronic kidney disease: Secondary | ICD-10-CM | POA: Diagnosis present

## 2015-12-27 DIAGNOSIS — K7469 Other cirrhosis of liver: Secondary | ICD-10-CM | POA: Diagnosis not present

## 2015-12-27 HISTORY — PX: ESOPHAGOGASTRODUODENOSCOPY: SHX5428

## 2015-12-27 LAB — PROTIME-INR
INR: 1.72
INR: 1.71
PROTHROMBIN TIME: 20.3 s — AB (ref 11.4–15.2)
Prothrombin Time: 20.4 seconds — ABNORMAL HIGH (ref 11.4–15.2)

## 2015-12-27 LAB — ABO/RH: ABO/RH(D): A POS

## 2015-12-27 LAB — CBC
HCT: 21.1 % — ABNORMAL LOW (ref 40.0–52.0)
Hemoglobin: 7.1 g/dL — ABNORMAL LOW (ref 13.0–18.0)
MCH: 33.6 pg (ref 26.0–34.0)
MCHC: 33.7 g/dL (ref 32.0–36.0)
MCV: 99.7 fL (ref 80.0–100.0)
Platelets: 126 10*3/uL — ABNORMAL LOW (ref 150–440)
RBC: 2.12 MIL/uL — ABNORMAL LOW (ref 4.40–5.90)
RDW: 15.6 % — ABNORMAL HIGH (ref 11.5–14.5)
WBC: 18.9 10*3/uL — ABNORMAL HIGH (ref 3.8–10.6)

## 2015-12-27 LAB — COMPREHENSIVE METABOLIC PANEL
ALT: 19 U/L (ref 17–63)
AST: 51 U/L — ABNORMAL HIGH (ref 15–41)
Albumin: 2.2 g/dL — ABNORMAL LOW (ref 3.5–5.0)
Alkaline Phosphatase: 57 U/L (ref 38–126)
Anion gap: 11 (ref 5–15)
BUN: 36 mg/dL — ABNORMAL HIGH (ref 6–20)
CO2: 21 mmol/L — ABNORMAL LOW (ref 22–32)
Calcium: 8 mg/dL — ABNORMAL LOW (ref 8.9–10.3)
Chloride: 102 mmol/L (ref 101–111)
Creatinine, Ser: 2.17 mg/dL — ABNORMAL HIGH (ref 0.61–1.24)
GFR calc Af Amer: 34 mL/min — ABNORMAL LOW (ref >60)
GFR calc non Af Amer: 29 mL/min — ABNORMAL LOW (ref >60)
Glucose, Bld: 126 mg/dL — ABNORMAL HIGH (ref 65–99)
Potassium: 3.8 mmol/L (ref 3.5–5.1)
Sodium: 134 mmol/L — ABNORMAL LOW (ref 135–145)
Total Bilirubin: 2.5 mg/dL — ABNORMAL HIGH (ref 0.3–1.2)
Total Protein: 5.4 g/dL — ABNORMAL LOW (ref 6.5–8.1)

## 2015-12-27 LAB — HEMOGLOBIN AND HEMATOCRIT, BLOOD
HCT: 22.3 % — ABNORMAL LOW (ref 40.0–52.0)
HEMATOCRIT: 18.5 % — AB (ref 40.0–52.0)
Hemoglobin: 6.4 g/dL — ABNORMAL LOW (ref 13.0–18.0)
Hemoglobin: 7.6 g/dL — ABNORMAL LOW (ref 13.0–18.0)

## 2015-12-27 LAB — AMMONIA: AMMONIA: 54 umol/L — AB (ref 9–35)

## 2015-12-27 LAB — MRSA PCR SCREENING: MRSA by PCR: NEGATIVE

## 2015-12-27 LAB — PREPARE RBC (CROSSMATCH)

## 2015-12-27 SURGERY — EGD (ESOPHAGOGASTRODUODENOSCOPY)
Anesthesia: General

## 2015-12-27 MED ORDER — ONDANSETRON HCL 4 MG/2ML IJ SOLN
4.0000 mg | Freq: Once | INTRAMUSCULAR | Status: AC | PRN
  Administered 2015-12-27: 4 mg via INTRAVENOUS
  Filled 2015-12-27: qty 2

## 2015-12-27 MED ORDER — EPHEDRINE SULFATE-NACL 50-0.9 MG/10ML-% IV SOSY
PREFILLED_SYRINGE | INTRAVENOUS | Status: DC | PRN
  Administered 2015-12-27: 15 mg via INTRAVENOUS

## 2015-12-27 MED ORDER — PANTOPRAZOLE SODIUM 40 MG IV SOLR: 40.0000 mg | Freq: Two times a day (BID) | INTRAVENOUS | Status: DC

## 2015-12-27 MED ORDER — SODIUM CHLORIDE 0.9 % IV SOLN: INTRAVENOUS | Status: DC

## 2015-12-27 MED ORDER — FENTANYL CITRATE (PF) 100 MCG/2ML IJ SOLN
INTRAMUSCULAR | Status: DC | PRN
  Administered 2015-12-27: 50 ug via INTRAVENOUS

## 2015-12-27 MED ORDER — SODIUM CHLORIDE 0.9 % IV BOLUS (SEPSIS)
1000.0000 mL | Freq: Once | INTRAVENOUS | Status: AC
Start: 2015-12-27 — End: 2015-12-27
  Administered 2015-12-27: 1000 mL via INTRAVENOUS

## 2015-12-27 MED ORDER — PHENYLEPHRINE HCL 10 MG/ML IJ SOLN
INTRAMUSCULAR | Status: DC | PRN
  Administered 2015-12-27 (×3): 100 ug via INTRAVENOUS

## 2015-12-27 MED ORDER — SODIUM CHLORIDE 0.9% FLUSH
3.0000 mL | Freq: Two times a day (BID) | INTRAVENOUS | Status: DC
  Administered 2015-12-27 – 2015-12-30 (×7): 3 mL via INTRAVENOUS

## 2015-12-27 MED ORDER — FENTANYL CITRATE (PF) 100 MCG/2ML IJ SOLN: 25.0000 ug | INTRAMUSCULAR | Status: DC | PRN

## 2015-12-27 MED ORDER — PROPOFOL 10 MG/ML IV BOLUS
INTRAVENOUS | Status: DC | PRN
  Administered 2015-12-27 (×2): 10 mg via INTRAVENOUS
  Administered 2015-12-27: 20 mg via INTRAVENOUS
  Administered 2015-12-27: 80 mg via INTRAVENOUS
  Administered 2015-12-27: 10 mg via INTRAVENOUS

## 2015-12-27 MED ORDER — SODIUM CHLORIDE 0.9 % IV SOLN
10.0000 mL/h | Freq: Once | INTRAVENOUS | Status: AC
  Administered 2015-12-27: 14:00:00 via INTRAVENOUS

## 2015-12-27 MED ORDER — SODIUM CHLORIDE 0.9 % IV SOLN
25.0000 ug/h | INTRAVENOUS | Status: DC
  Administered 2015-12-28 (×2): 25 ug/h via INTRAVENOUS
  Filled 2015-12-27 (×3): qty 1

## 2015-12-27 MED ORDER — CEFTRIAXONE SODIUM-DEXTROSE 1-3.74 GM-% IV SOLR
1.0000 g | INTRAVENOUS | Status: DC
  Filled 2015-12-27: qty 50

## 2015-12-27 MED ORDER — OCTREOTIDE ACETATE 500 MCG/ML IJ SOLN
25.0000 ug/h | INTRAMUSCULAR | Status: DC
  Administered 2015-12-27: 25 ug/h via INTRAVENOUS
  Filled 2015-12-27 (×2): qty 1

## 2015-12-27 MED ORDER — OCTREOTIDE LOAD VIA INFUSION
50.0000 ug | Freq: Once | INTRAVENOUS | Status: AC
  Administered 2015-12-27: 50 ug via INTRAVENOUS
  Filled 2015-12-27: qty 25

## 2015-12-27 MED ORDER — CEFTRIAXONE SODIUM-DEXTROSE 1-3.74 GM-% IV SOLR
1.0000 g | Freq: Once | INTRAVENOUS | Status: AC
  Administered 2015-12-27: 1 g via INTRAVENOUS
  Filled 2015-12-27: qty 50

## 2015-12-27 MED ORDER — PANTOPRAZOLE SODIUM 40 MG IV SOLR
80.0000 mg | Freq: Once | INTRAVENOUS | Status: AC
  Administered 2015-12-27: 80 mg via INTRAVENOUS
  Filled 2015-12-27: qty 80

## 2015-12-27 MED ORDER — SODIUM CHLORIDE 0.9 % IV SOLN
8.0000 mg/h | INTRAVENOUS | Status: DC
  Administered 2015-12-27 – 2015-12-29 (×6): 8 mg/h via INTRAVENOUS
  Filled 2015-12-27 (×5): qty 80

## 2015-12-27 MED ORDER — SODIUM CHLORIDE 0.9 % IV SOLN: Freq: Once | INTRAVENOUS | Status: DC

## 2015-12-27 NOTE — ED Triage Notes (Signed)
Pt states he stopped Rivaroxaban in May

## 2015-12-27 NOTE — ED Notes (Signed)
Multiple attempts made to clean patient up, pt declines at this time.

## 2015-12-27 NOTE — Consult Note (Signed)
Referring Provider:  Dr. Lenard LancePaduchowski Primary Care Physician:  Rozanna BoxBABAOFF, MARC E, MD   Reason for Consultation:  GI bleed  HPI: Lawrence West is a 70 y.o. male with alcoholic cirrhosis presents with profuse melenic stools since Wednesday. Reports a small episode of spitting out blood that he thinks is due to his gums. Denies vomiting blood. He started dry heaving after my evaluation with nonbloody fluid. Denies abdominal pain, dizziness. Has been taking Aleve BID for years for arthritis. Former alcohol abuser and reports cutting down last January but does admit to drinking several drinks this past weekend. Denies any history of variceal bleeding and denies any history of peptic ulcer disease. Thinks he had GI bleeding several years ago but does not know any details. Remote history of a sigmoidoscopy but results not known. Denies history of a colonoscopy or EGD in the past. Hgb 7.1.  Past Medical History:  Diagnosis Date  . Arthritis    "left knee" (07/08/2015)  . Ascites   . Avascular necrosis of bone of left hip (HCC) 07/08/2015  . Brown recluse spider bite 1990s   "they thought I was gonna die then"  . Cirrhosis of liver (HCC)    Sees Dr. Sung Amabileari Richards  . Depression   . Edema of both legs    Takes Lasix  . Gout X 1  . High cholesterol   . Hypertension   . Narcolepsy   . Rheumatic fever   . Sleep apnea    pt stated "I have sleep apnea but I do not use the machine...Marland Kitchen.Marland Kitchen.Marland Kitchen.it broke" (07/08/2015)    Past Surgical History:  Procedure Laterality Date  . COSMETIC SURGERY  ~ 1997   "nose done, lip drop, chin lift"  . FOOT FRACTURE SURGERY Right   . FRACTURE SURGERY    . INGUINAL HERNIA REPAIR Bilateral 1990s  . TONSILLECTOMY  1954  . TOTAL HIP ARTHROPLASTY Left 07/08/2015  . TOTAL HIP ARTHROPLASTY Left 07/08/2015   Procedure: LEFT TOTAL HIP ARTHROPLASTY;  Surgeon: Teryl LucyJoshua Landau, MD;  Location: [facility] OR;  Service: Orthopedics;  Laterality: Left;  . TOTAL KNEE ARTHROPLASTY Left 03/2008  . US  GUIDED PARACENTESIS [facility])  April 2017    Prior to Admission medications   Medication Sig Start Date End Date Taking? Authorizing Provider  atorvastatin (LIPITOR) 10 MG tablet Take 1 tablet (10 mg total) by mouth daily. 05/20/15  Yes Enid Baasadhika Kalisetti, MD  bismuth subsalicylate (PEPTO BISMOL) 262 MG/15ML suspension Take 30 mLs by mouth every 6 (six) hours as needed.   Yes Historical Provider, MD  ferrous sulfate 325 (65 FE) MG tablet Take 325 mg by mouth daily with breakfast.   Yes Historical Provider, MD  furosemide (LASIX) 40 MG tablet Take 1 tablet (40 mg total) by mouth daily. 05/20/15  Yes Enid Baasadhika Kalisetti, MD  loperamide (IMODIUM A-D) 2 MG tablet Take 2 mg by mouth 4 (four) times daily as needed for diarrhea or loose stools.   Yes Historical Provider, MD  Multiple Vitamin (MULTIVITAMIN WITH MINERALS) TABS tablet Take 1 tablet by mouth daily.   Yes Historical Provider, MD  ondansetron (ZOFRAN) 4 MG tablet Take 1 tablet (4 mg total) by mouth every 8 (eight) hours as needed for nausea or vomiting. 07/08/15  Yes Teryl LucyJoshua Landau, MD    Scheduled Meds: . [START ON 12/28/2015] cefTRIAXone  1 g Intravenous Q24H  . octreotide  50 mcg Intravenous Once   Continuous Infusions: . sodium chloride    . octreotide  (SANDOSTATIN)  IV infusion    . pantoprozole (PROTONIX) infusion 8 mg/hr (12/27/15 1134)   PRN Meds:.  Allergies as of 12/27/2015  . (No Known Allergies)    Family History  Problem Relation Age of Onset  . Melanoma Father   . Lung cancer Father     Social History   Social History  . Marital status: Single    Spouse name: N/A  . Number of children: N/A  . Years of education: N/A   Occupational History  . Not on file.   Social History Main Topics  . Smoking status: Never Smoker  . Smokeless tobacco: Never Used  . Alcohol use 0.0 oz/week     Comment: 07/08/2015 former Heavy ETOH abuse.; "haven't had a drink in 2017; AA told me I didn't need to come back"  . Drug use:      Types: Marijuana     Comment: 07/08/2015 "just in my early 20s"  . Sexual activity: No   Other Topics Concern  . Not on file   Social History Narrative  . No narrative on file    Review of Systems: Positive for: All negative except as stated above in HPI.  Physical Exam: Vital signs in last 24 hours: Temp:  [99.1 F (37.3 C)] 99.1 F (37.3 C) (11/18 0955) Pulse Rate:  [94-116] 112 (11/18 1230) Resp:  [16-21] 20 (11/18 1230) BP: (95-131)/(65-80) 131/80 (11/18 1230) SpO2:  [94 %-99 %] 97 % (11/18 1230) Weight:  [200 lb (90.7 kg)] 200 lb (90.7 kg) (11/18 0950)   General:   Lethargic, disheveled, no acute distress HEENT: anicteric sclera Neck: supple, nontender Lungs:  Clear throughout to auscultation.   No wheezes, crackles, or rhonchi. No acute distress. Heart:  Regular rate and rhythm; no murmurs, clicks, rubs,  or gallops. Abdomen: Soft, nontender and nondistended. Normal bowel sounds, without guarding, and without rebound. Rectal:  Deferred Ext: no edema  GI:  Lab Results:  Recent Labs  12/27/15 0952  WBC 18.9*  HGB 7.1*  HCT 21.1*  PLT 126*   BMET  Recent Labs  12/27/15 0952  NA 134*  K 3.8  CL 102  CO2 21*  GLUCOSE 126*  BUN 36*  CREATININE 2.17*  CALCIUM 8.0*   LFT  Recent Labs  12/27/15 0952  PROT 5.4*  ALBUMIN 2.2*  AST 51*  ALT 19  ALKPHOS 57  BILITOT 2.5*   PT/INR No results for input(s): LABPROT, INR in the last 72 hours.   Studies/Results: Dg Chest 1 View  Result Date: 12/27/2015 CLINICAL DATA:  Dizziness and elevated white count. EXAM: CHEST 1 VIEW COMPARISON:  03/05/2008 chest radiograph FINDINGS: The cardiomediastinal silhouette is unremarkable. Mild bibasilar opacities are noted -most likely representing atelectasis. There is no evidence of pulmonary edema, suspicious pulmonary nodule/mass, pleural effusion, or pneumothorax. No acute bony abnormalities are identified. IMPRESSION: Mild bibasilar opacities -probably  atelectasis. Electronically Signed   By: Jeffrey  Hu M.D.   On: 12/27/2015 12:29    Impression/Plan: 70 yo with melenic stools in the setting of alcoholic cirrhosis and chronic NSAID use concerning for peptic ulcer disease vs variceal bleed. Variceal bleed remains in the differential due to his cirrhosis but his history is more concerning for a peptic ulcer source. Continue Octreotide drip and Protonix drip. Agree with blood transfusion, Abx. Check coags. NPO. EGD today.    LOS: 0 days   Delanie Tirrell C.  12/27/2015, 1:11 PM  

## 2015-12-27 NOTE — Interval H&P Note (Signed)
History and Physical Interval Note:  12/27/2015 2:20 PM  Lawrence West  has presented today for surgery, with the diagnosis of GI BLEED  The various methods of treatment have been discussed with the patient and family. After consideration of risks, benefits and other options for treatment, the patient has consented to  Procedure(s): ESOPHAGOGASTRODUODENOSCOPY (EGD) (N/A) as a surgical intervention .  The patient's history has been reviewed, patient examined, no change in status, stable for surgery.  I have reviewed the patient's chart and labs.  Questions were answered to the patient's satisfaction.     Makeyla Govan C.

## 2015-12-27 NOTE — Progress Notes (Addendum)
Pharmacy Antibiotic Note  Lawrence West is a 70 y.o. male admitted on 12/27/2015 with GI bleed and alcoholic liver cirrhosis.  Pharmacy has been consulted for ceftriaxone dosing.  Plan: Begin ceftriaxone 1 g IV q 24 hours. Total antibiotic duration is 7 days of therapy for variceal bleed. Will continue to monitor and adjust per consult.  Height: 5\' 11"  (180.3 cm) Weight: 200 lb (90.7 kg) IBW/kg (Calculated) : 75.3  Temp (24hrs), Avg:99.1 F (37.3 C), Min:99.1 F (37.3 C), Max:99.1 F (37.3 C)   Recent Labs Lab 12/27/15 0952  WBC 18.9*  CREATININE 2.17*    Estimated Creatinine Clearance: 37 mL/min (by C-G formula based on SCr of 2.17 mg/dL (H)).    No Known Allergies  Antimicrobials this admission: Ceftriaxone 11/18 >>    Dose adjustments this admission:   Microbiology results:  BCx:   UCx:    Sputum:    MRSA PCR:   Thank you for allowing pharmacy to be a part of this patient's care.  Horris LatinoHolly Gilliam, PharmD Pharmacy Resident 12/27/2015 12:23 PM

## 2015-12-27 NOTE — Transfer of Care (Signed)
Immediate Anesthesia Transfer of Care Note  Patient: Lawrence West  Procedure(s) Performed: Procedure(s): ESOPHAGOGASTRODUODENOSCOPY (EGD) (N/A)  Patient Location: PACU and Endoscopy Unit  Anesthesia Type:General  Level of Consciousness: awake, alert  and oriented  Airway & Oxygen Therapy: Patient Spontanous Breathing and Patient connected to nasal cannula oxygen  Post-op Assessment: Report given to RN and Post -op Vital signs reviewed and stable  Post vital signs: Reviewed and stable  Last Vitals:  Vitals:   12/27/15 1230 12/27/15 1442  BP: 131/80 (!) 100/53  Pulse: (!) 112 (!) 111  Resp: 20 20  Temp:      Last Pain:  Vitals:   12/27/15 0955  TempSrc: Oral  PainSc:          Complications: No apparent anesthesia complications

## 2015-12-27 NOTE — ED Notes (Addendum)
Pt agreed to be cleaned up. Large amount of red bloody stool present in diaper that was mostly dry. Peri care completed at this time. Pt tearful about needed to be cleaned up, but was receptive of care. No skin break down noted. Unable to get urine sample at this time. Pt already urinated in diaper.

## 2015-12-27 NOTE — Anesthesia Preprocedure Evaluation (Addendum)
Anesthesia Evaluation  Patient identified by MRN, date of birth, ID band Patient awake    Reviewed: Allergy & Precautions, NPO status , Patient's Chart, lab work & pertinent test results  Airway Mallampati: II  TM Distance: <3 FB     Dental  (+) Partial Upper   Pulmonary sleep apnea and Continuous Positive Airway Pressure Ventilation ,    Pulmonary exam normal        Cardiovascular hypertension, Pt. on medications Normal cardiovascular exam     Neuro/Psych PSYCHIATRIC DISORDERS Depression    GI/Hepatic (+) Cirrhosis     substance abuse  alcohol use, GI bleed   Endo/Other  negative endocrine ROS  Renal/GU negative Renal ROS  negative genitourinary   Musculoskeletal  (+) Arthritis , Osteoarthritis,    Abdominal Normal abdominal exam  (+)   Peds negative pediatric ROS (+)  Hematology negative hematology ROS (+)   Anesthesia Other Findings   Reproductive/Obstetrics                             Anesthesia Physical Anesthesia Plan  ASA: IV and emergent  Anesthesia Plan: General   Post-op Pain Management:    Induction: Intravenous  Airway Management Planned: Nasal Cannula  Additional Equipment:   Intra-op Plan:   Post-operative Plan:   Informed Consent: I have reviewed the patients History and Physical, chart, labs and discussed the procedure including the risks, benefits and alternatives for the proposed anesthesia with the patient or authorized representative who has indicated his/her understanding and acceptance.   Dental advisory given  Plan Discussed with: CRNA and Surgeon  Anesthesia Plan Comments:         Anesthesia Quick Evaluation

## 2015-12-27 NOTE — Brief Op Note (Addendum)
Multiple proximal gastric ulcers with pigmented material but no active bleeding and no visible vessels seen. No varices seen. No blood in stomach. Continue Octreotide drip and Protonix drip. NPO except ice chips and sips of water. Supportive care. Ulcers likely due to NSAIDs but heck H. Pylori serology and treat if positive. See procedure note for complete details.

## 2015-12-27 NOTE — ED Triage Notes (Signed)
BIB EMS from home for reports of multiple Dark stools since Wednesday. Pt reports weakness and dizziness. C/o lower abd cramping. Denies fever. Pt taking Rivaroxaban.

## 2015-12-27 NOTE — Progress Notes (Signed)
Family Meeting Note  Advance Directive:no  Today a meeting took place with the Patient and patient's care giver who will make medical decisions for him in event he cannot (however no official paper work yet)   The following clinical team members were present during this meeting:MD  The following were discussed:Patient's diagnosis severe GIB with melena and acute blood loss anemia requiring stabilization with fluids and PRBC  Patient's progosis: POOR if he continues to drink and Goals for treatment: Full Code  Additional follow-up to be provided: patient will need advance directives  He is agreeable to blood transfusion He acknowledges that it was important to talk about CODE status because he does not want to have to put family members on deciding. He says his care giver/frined will make decisions for him (medical decisions) All questions answered.   Time spent during discussion:20 minutes  Kalika Smay, MD

## 2015-12-27 NOTE — ED Notes (Signed)
Report given to ENDO RN

## 2015-12-27 NOTE — H&P (View-Only) (Signed)
Referring Provider:  Dr. Lenard LancePaduchowski Primary Care Physician:  Rozanna BoxBABAOFF, MARC E, MD   Reason for Consultation:  GI bleed  HPI: Lawrence West is a 70 y.o. male with alcoholic cirrhosis presents with profuse melenic stools since Wednesday. Reports a small episode of spitting out blood that he thinks is due to his gums. Denies vomiting blood. He started dry heaving after my evaluation with nonbloody fluid. Denies abdominal pain, dizziness. Has been taking Aleve BID for years for arthritis. Former alcohol abuser and reports cutting down last January but does admit to drinking several drinks this past weekend. Denies any history of variceal bleeding and denies any history of peptic ulcer disease. Thinks he had GI bleeding several years ago but does not know any details. Remote history of a sigmoidoscopy but results not known. Denies history of a colonoscopy or EGD in the past. Hgb 7.1.  Past Medical History:  Diagnosis Date  . Arthritis    "left knee" (07/08/2015)  . Ascites   . Avascular necrosis of bone of left hip (HCC) 07/08/2015  . Brown recluse spider bite 1990s   "they thought I was gonna die then"  . Cirrhosis of liver (HCC)    Sees Dr. Sung Amabileari Richards  . Depression   . Edema of both legs    Takes Lasix  . Gout X 1  . High cholesterol   . Hypertension   . Narcolepsy   . Rheumatic fever   . Sleep apnea    pt stated "I have sleep apnea but I do not use the machine...Marland Kitchen.Marland Kitchen.Marland Kitchen.it broke" (07/08/2015)    Past Surgical History:  Procedure Laterality Date  . COSMETIC SURGERY  ~ 1997   "nose done, lip drop, chin lift"  . FOOT FRACTURE SURGERY Right   . FRACTURE SURGERY    . INGUINAL HERNIA REPAIR Bilateral 1990s  . TONSILLECTOMY  1954  . TOTAL HIP ARTHROPLASTY Left 07/08/2015  . TOTAL HIP ARTHROPLASTY Left 07/08/2015   Procedure: LEFT TOTAL HIP ARTHROPLASTY;  Surgeon: Teryl LucyJoshua Landau, MD;  Location: [facility] OR;  Service: Orthopedics;  Laterality: Left;  . TOTAL KNEE ARTHROPLASTY Left 03/2008  . US  GUIDED PARACENTESIS [facility])  April 2017    Prior to Admission medications   Medication Sig Start Date End Date Taking? Authorizing Provider  atorvastatin (LIPITOR) 10 MG tablet Take 1 tablet (10 mg total) by mouth daily. 05/20/15  Yes Enid Baasadhika Kalisetti, MD  bismuth subsalicylate (PEPTO BISMOL) 262 MG/15ML suspension Take 30 mLs by mouth every 6 (six) hours as needed.   Yes Historical Provider, MD  ferrous sulfate 325 (65 FE) MG tablet Take 325 mg by mouth daily with breakfast.   Yes Historical Provider, MD  furosemide (LASIX) 40 MG tablet Take 1 tablet (40 mg total) by mouth daily. 05/20/15  Yes Enid Baasadhika Kalisetti, MD  loperamide (IMODIUM A-D) 2 MG tablet Take 2 mg by mouth 4 (four) times daily as needed for diarrhea or loose stools.   Yes Historical Provider, MD  Multiple Vitamin (MULTIVITAMIN WITH MINERALS) TABS tablet Take 1 tablet by mouth daily.   Yes Historical Provider, MD  ondansetron (ZOFRAN) 4 MG tablet Take 1 tablet (4 mg total) by mouth every 8 (eight) hours as needed for nausea or vomiting. 07/08/15  Yes Teryl LucyJoshua Landau, MD    Scheduled Meds: . [START ON 12/28/2015] cefTRIAXone  1 g Intravenous Q24H  . octreotide  50 mcg Intravenous Once   Continuous Infusions: . sodium chloride    . octreotide  (SANDOSTATIN)  IV infusion    . pantoprozole (PROTONIX) infusion 8 mg/hr (12/27/15 1134)   PRN Meds:.  Allergies as of 12/27/2015  . (No Known Allergies)    Family History  Problem Relation Age of Onset  . Melanoma Father   . Lung cancer Father     Social History   Social History  . Marital status: Single    Spouse name: N/A  . Number of children: N/A  . Years of education: N/A   Occupational History  . Not on file.   Social History Main Topics  . Smoking status: Never Smoker  . Smokeless tobacco: Never Used  . Alcohol use 0.0 oz/week     Comment: 07/08/2015 former Heavy ETOH abuse.; "haven't had a drink in 2017; AA told me I didn't need to come back"  . Drug use:      Types: Marijuana     Comment: 07/08/2015 "just in my early 20s"  . Sexual activity: No   Other Topics Concern  . Not on file   Social History Narrative  . No narrative on file    Review of Systems: Positive for: All negative except as stated above in HPI.  Physical Exam: Vital signs in last 24 hours: Temp:  [99.1 F (37.3 C)] 99.1 F (37.3 C) (11/18 0955) Pulse Rate:  [94-116] 112 (11/18 1230) Resp:  [16-21] 20 (11/18 1230) BP: (95-131)/(65-80) 131/80 (11/18 1230) SpO2:  [94 %-99 %] 97 % (11/18 1230) Weight:  [200 lb (90.7 kg)] 200 lb (90.7 kg) (11/18 0950)   General:   Lethargic, disheveled, no acute distress HEENT: anicteric sclera Neck: supple, nontender Lungs:  Clear throughout to auscultation.   No wheezes, crackles, or rhonchi. No acute distress. Heart:  Regular rate and rhythm; no murmurs, clicks, rubs,  or gallops. Abdomen: Soft, nontender and nondistended. Normal bowel sounds, without guarding, and without rebound. Rectal:  Deferred Ext: no edema  GI:  Lab Results:  Recent Labs  12/27/15 0952  WBC 18.9*  HGB 7.1*  HCT 21.1*  PLT 126*   BMET  Recent Labs  12/27/15 0952  NA 134*  K 3.8  CL 102  CO2 21*  GLUCOSE 126*  BUN 36*  CREATININE 2.17*  CALCIUM 8.0*   LFT  Recent Labs  12/27/15 0952  PROT 5.4*  ALBUMIN 2.2*  AST 51*  ALT 19  ALKPHOS 57  BILITOT 2.5*   PT/INR No results for input(s): LABPROT, INR in the last 72 hours.   Studies/Results: Dg Chest 1 View  Result Date: 12/27/2015 CLINICAL DATA:  Dizziness and elevated white count. EXAM: CHEST 1 VIEW COMPARISON:  03/05/2008 chest radiograph FINDINGS: The cardiomediastinal silhouette is unremarkable. Mild bibasilar opacities are noted -most likely representing atelectasis. There is no evidence of pulmonary edema, suspicious pulmonary nodule/mass, pleural effusion, or pneumothorax. No acute bony abnormalities are identified. IMPRESSION: Mild bibasilar opacities -probably  atelectasis. Electronically Signed   By: Harmon PierJeffrey  Hu M.D.   On: 12/27/2015 12:29    Impression/Plan: 70 yo with melenic stools in the setting of alcoholic cirrhosis and chronic NSAID use concerning for peptic ulcer disease vs variceal bleed. Variceal bleed remains in the differential due to his cirrhosis but his history is more concerning for a peptic ulcer source. Continue Octreotide drip and Protonix drip. Agree with blood transfusion, Abx. Check coags. NPO. EGD today.    LOS: 0 days   Ellory Khurana C.  12/27/2015, 1:11 PM

## 2015-12-27 NOTE — ED Notes (Signed)
Consent signed for EGD 

## 2015-12-27 NOTE — Progress Notes (Addendum)
Notified Dr Bosie ClosSchooler of patient complaint that his abdomen felt like it was "growing" and that he was having nausea. Order to admin zofran, continue to monitor, vital signs, transfuse another unit PRBCs after this one completes, recheck HGB 2 hours after second transfusion completes.

## 2015-12-27 NOTE — ED Provider Notes (Signed)
[facility] Emergency Department Provider Note  Time seen: 12:00 PM  I have reviewed the triage vital signs and the nursing notes.   HISTORY  Chief Complaint GI Bleeding    HPI Lawrence West is a 70 y.o. male with a past medical history of cirrhosis who presents to the emergency department with dark stool. According to the patient for the past 5 or 6 days he has been having dark stool. Patient states he did have one episode of vomiting with some blood in it 5 days ago but none since. No abdominal pain. No fever. Blood thinners were discontinued 5 months ago.  Past Medical History:  Diagnosis Date  . Arthritis    "left knee" (07/08/2015)  . Ascites   . Avascular necrosis of bone of left hip (HCC) 07/08/2015  . Brown recluse spider bite 1990s   "they thought I was gonna die then"  . Cirrhosis of liver (HCC)    Sees Dr. Sung Amabileari Richards  . Depression   . Edema of both legs    Takes Lasix  . Gout X 1  . High cholesterol   . Hypertension   . Narcolepsy   . Rheumatic fever   . Sleep apnea    pt stated "I have sleep apnea but I do not use the machine...Marland Kitchen.Marland Kitchen.Marland Kitchen.it broke" (07/08/2015)    Patient Active Problem List   Diagnosis Date Noted  . Hyperlipidemia 07/23/2015  . Depression 07/23/2015  . Avascular necrosis of bone of left hip (HCC) 07/08/2015  . S/P total hip arthroplasty 07/08/2015  . Ascites due to alcoholic cirrhosis (HCC) 05/19/2015    Past Surgical History:  Procedure Laterality Date  . COSMETIC SURGERY  ~ 1997   "nose done, lip drop, chin lift"  . FOOT FRACTURE SURGERY Right   . FRACTURE SURGERY    . INGUINAL HERNIA REPAIR Bilateral 1990s  . TONSILLECTOMY  1954  . TOTAL HIP ARTHROPLASTY Left 07/08/2015  . TOTAL HIP ARTHROPLASTY Left 07/08/2015   Procedure: LEFT TOTAL HIP ARTHROPLASTY;  Surgeon: Teryl LucyJoshua Landau, MD;  Location: [facility] OR;  Service: Orthopedics;  Laterality: Left;  . TOTAL KNEE ARTHROPLASTY Left 03/2008  . US GUIDED PARACENTESIS [facility]  HX)  April 2017    Prior to Admission medications   Medication Sig Start Date End Date Taking? Authorizing Provider  atorvastatin (LIPITOR) 10 MG tablet Take 1 tablet (10 mg total) by mouth daily. 05/20/15   Enid Baasadhika Kalisetti, MD  baclofen (LIORESAL) 10 MG tablet Take 1 tablet (10 mg total) by mouth 3 (three) times daily. As needed for muscle spasm 07/08/15   Teryl LucyJoshua Landau, MD  buPROPion (WELLBUTRIN XL) 150 MG 24 hr tablet Take 1 tablet (150 mg total) by mouth daily. 05/20/15   Enid Baasadhika Kalisetti, MD  ferrous sulfate 325 (65 FE) MG tablet Take 325 mg by mouth daily with breakfast.    Historical Provider, MD  furosemide (LASIX) 40 MG tablet Take 1 tablet (40 mg total) by mouth daily. 05/20/15   Enid Baasadhika Kalisetti, MD  Lactulose 20 GM/30ML SOLN Take 30 mLs (20 g total) by mouth 2 (two) times daily. HOLD FOR > 3 bowel movements /day 05/20/15   Enid Baasadhika Kalisetti, MD  losartan-hydrochlorothiazide (HYZAAR) 50-12.5 MG tablet Take 1 tablet by mouth daily. 05/20/15   Enid Baasadhika Kalisetti, MD  Multiple Vitamin (MULTIVITAMIN WITH MINERALS) TABS tablet Take 1 tablet by mouth daily.    Historical Provider, MD  ondansetron (ZOFRAN) 4 MG tablet Take 1 tablet (4 mg total) by mouth every 8 (  eight) hours as needed for nausea or vomiting. 07/08/15   Teryl Lucy, MD  Oxycodone HCl 10 MG TABS Take 1 tablet (10 mg total) by mouth 4 (four) times daily as needed (severe pain). 07/08/15   Teryl Lucy, MD  potassium chloride SA (K-DUR,KLOR-CON) 20 MEQ tablet Take 1 tablet (20 mEq total) by mouth daily. 05/20/15   Enid Baas, MD  rivaroxaban (XARELTO) 10 MG TABS tablet Take 1 tablet (10 mg total) by mouth daily. 07/08/15   Teryl Lucy, MD  spironolactone (ALDACTONE) 25 MG tablet Take 1 tablet (25 mg total) by mouth daily. 05/20/15   Enid Baas, MD    No Known Allergies  Family History  Problem Relation Age of Onset  . Melanoma Father   . Lung cancer Father     Social History Social History  Substance Use  Topics  . Smoking status: Never Smoker  . Smokeless tobacco: Never Used  . Alcohol use 0.0 oz/week     Comment: 07/08/2015 former Heavy ETOH abuse.; "haven't had a drink in 2017; AA told me I didn't need to come back"    Review of Systems Constitutional: Negative for fever Cardiovascular: Negative for chest pain. Respiratory: Negative for shortness of breath. Gastrointestinal: Negative for abdominal pain. Positive for dark stool. Musculoskeletal: Negative for back pain Neurological: Negative for headache 10-point ROS otherwise negative.  ____________________________________________   PHYSICAL EXAM:  VITAL SIGNS: ED Triage Vitals  Enc Vitals Group     BP 12/27/15 0940 111/67     Pulse Rate 12/27/15 0940 94     Resp 12/27/15 0940 20     Temp 12/27/15 0955 99.1 F (37.3 C)     Temp Source 12/27/15 0940 Oral     SpO2 12/27/15 0940 99 %     Weight 12/27/15 0950 200 lb (90.7 kg)     Height 12/27/15 0950 5\' 11"  (1.803 m)     Head Circumference --      Peak Flow --      Pain Score 12/27/15 0951 2     Pain Loc --      Pain Edu? --      Excl. in GC? --     Constitutional: Alert and oriented. Well appearing and in no distress. Eyes: Normal exam ENT   Head: Normocephalic and atraumatic.   Mouth/Throat: Mucous membranes are moist. Cardiovascular: Normal rate, regular rhythm. No murmur Respiratory: Normal respiratory effort without tachypnea nor retractions. Breath sounds are clear  Gastrointestinal: Moderately distended abdomen. Nontender to palpation. Rectal exam shows gross blood, strongly guaiac positive. Musculoskeletal: Nontender with normal range of motion in all extremities.  Neurologic:  Normal speech and language. No gross focal neurologic deficits Skin:  Skin is warm, dry and intact.  Psychiatric: Mood and affect are normal.   ____________________________________________   INITIAL IMPRESSION / ASSESSMENT AND PLAN / ED COURSE  Pertinent labs & imaging  results that were available during my care of the patient were reviewed by me and considered in my medical decision making (see chart for details).  Patient presented to emergency department with lower GI bleeding. Gross blood on rectal exam. Nontender abdomen. Given the patient's history of cirrhosis we will start on Protonix as well as octreotide infusions. Patient has a hemoglobin of 7.1 with gross blood on rectal exam, large bowel movement in the emergency department. We will transfuse with 1 unit emergent release release, patient will likely require additional units once admitted to the hospital. I discussed the patient with GI medicine  as well as the medical hospitalist will be admitting the patient to a stepdown unit.   CRITICAL CARE Performed by: Minna AntisPADUCHOWSKI, Alivea Gladson   Total critical care time: 45 minutes  Critical care time was exclusive of separately billable procedures and treating other patients.  Critical care was necessary to treat or prevent imminent or life-threatening deterioration.  Critical care was time spent personally by me on the following activities: development of treatment plan with patient and/or surrogate as well as nursing, discussions with consultants, evaluation of patient's response to treatment, examination of patient, obtaining history from patient or surrogate, ordering and performing treatments and interventions, ordering and review of laboratory studies, ordering and review of radiographic studies, pulse oximetry and re-evaluation of patient's condition.   ____________________________________________   FINAL CLINICAL IMPRESSION(S) / ED DIAGNOSES  GI bleed    Minna AntisKevin Dicie Edelen, MD 12/27/15 1223

## 2015-12-27 NOTE — Op Note (Signed)
[facility] Patient Name: Lawrence West Procedure Date: 12/27/2015 1:56 PM MRN: [id number] Account #: [id number] Date of Birth: 01/12/1946 Admit Type: Outpatient Age: 70 Room: [facility] ENDO ROOM 1 Gender: Male Note Status: Finalized Procedure:            Upper GI endoscopy Indications:          Acute post hemorrhagic anemia, Melena Providers:            Shirley Friar, MD Medicines:            Propofol per Anesthesia, Monitored Anesthesia Care Complications:        No immediate complications. Procedure:            Pre-Anesthesia Assessment:                       - Prior to the procedure, a History and Physical was                        performed, and patient medications and allergies were                        reviewed. The patient's tolerance of previous                        anesthesia was also reviewed. The risks and benefits of                        the procedure and the sedation options and risks were                        discussed with the patient. All questions were                        answered, and informed consent was obtained. Prior                        Anticoagulants: The patient has taken no previous                        anticoagulant or antiplatelet agents. ASA Grade                        Assessment: IV - A patient with severe systemic disease                        that is a constant threat to life. After reviewing the                        risks and benefits, the patient was deemed in                        satisfactory condition to undergo the procedure.                       After obtaining informed consent, the endoscope was                        passed under direct vision. Throughout the procedure,  the patient's blood pressure, pulse, and oxygen                        saturations were monitored continuously. The Endoscope                        was introduced through the mouth, and advanced  to the                        second part of duodenum. The upper GI endoscopy was                        accomplished without difficulty. The patient tolerated                        the procedure well. Findings:      There is no endoscopic evidence of varices in the entire esophagus.      The examined esophagus was normal.      The Z-line was regular and was found 38 cm from the incisors.      Many non-bleeding cratered gastric ulcers with pigmented material were       found in the cardia and in the gastric fundus. The largest lesion was 10       mm in largest dimension.      Segmental moderate inflammation characterized by congestion (edema) and       erythema was found in the cardia and in the gastric fundus.      Segmental moderate inflammation characterized by congestion (edema),       erosions, erythema and linear erosions was found in the gastric antrum.      A small hiatal hernia was present.      The examined duodenum was normal. Impression:           - Normal esophagus.                       - Z-line regular, 38 cm from the incisors.                       - Non-bleeding gastric ulcers with pigmented material.                       - Acute gastritis.                       - Small hiatal hernia.                       - Normal examined duodenum.                       - No specimens collected. Recommendation:       - NPO.                       - Give Protonix (pantoprazole): 8 mg/hr IV by                        continuous infusion.                       - No aspirin, ibuprofen, naproxen, or other  non-steroidal anti-inflammatory drugs.                       - Perform an H. pylori serology. Procedure Code(s):    --- Professional ---                       [phone], Esophagogastroduodenoscopy, flexible, transoral;                        diagnostic, including collection of specimen(s) by                        brushing or washing, when performed (separate  procedure) Diagnosis Code(s):    --- Professional ---                       K92.1, Melena (includes Hematochezia)                       K25.9, Gastric ulcer, unspecified as acute or chronic,                        without hemorrhage or perforation                       K29.00, Acute gastritis without bleeding                       D62, Acute posthemorrhagic anemia                       K44.9, Diaphragmatic hernia without obstruction or                        gangrene CPT copyright 2016 [employer]. All rights reserved. The codes documented in this report are preliminary and upon coder review may  be revised to meet current compliance requirements. Shirley FriarVincent C Cay Kath, MD 12/27/2015 2:54:55 PM This report has been signed electronically. Number of Addenda: 0 Note Initiated On: 12/27/2015 1:56 PM      [facility]

## 2015-12-27 NOTE — H&P (Addendum)
[facility] -  at [facility]   PATIENT NAME: Lawrence West    MR#:  [id number]  DATE OF BIRTH:  06/08/1945  DATE OF ADMISSION:  12/27/2015  PRIMARY CARE PHYSICIAN: BABAOFF, Lavada MesiMARC E, MD   REQUESTING/REFERRING PHYSICIAN:  Dr Lenard LancePaduchowski  CHIEF COMPLAINT:   Dark colored stools HISTORY OF PRESENT ILLNESS:  Lawrence AcreDavid West  is a 70 y.o. male with a known history of Alcohol related liver cirrhosis and sleep apnea not compliant with CPAP who presents with above complaint. Patient reports since Wednesday he has had dark-colored stools.abdominal pain. He is taking NSAIDs on a daily basis for hip pain. He has required blood transfusions in the past and has been evaluated by GI in the past. To his knowledge she does not have a history of esophageal varices. He has been on of his medications for several weeks. He has had several melanotic stools int he ED. His BP is currently responding to IVF (Bllod has been ordered) He reports no history of SBP. He does report increased abdominal girth. He reports he stopped drinking in January however had a couple beers this past weekend.  PAST MEDICAL HISTORY:   Past Medical History:  Diagnosis Date  . Arthritis    "left knee" (07/08/2015)  . Ascites   . Avascular necrosis of bone of left hip (HCC) 07/08/2015  . Brown recluse spider bite 1990s   "they thought I was gonna die then"  . Cirrhosis of liver (HCC)    Sees Dr. Sung Amabileari Richards  . Depression   . Edema of both legs    Takes Lasix  . Gout X 1  . High cholesterol   . Hypertension   . Narcolepsy   . Rheumatic fever   . Sleep apnea    pt stated "I have sleep apnea but I do not use the machine...Marland Kitchen.Marland Kitchen.Marland Kitchen.it broke" (07/08/2015)    PAST SURGICAL HISTORY:   Past Surgical History:  Procedure Laterality Date  . COSMETIC SURGERY  ~ 1997   "nose done, lip drop, chin lift"  . FOOT FRACTURE SURGERY Right   . FRACTURE SURGERY    . INGUINAL HERNIA REPAIR Bilateral 1990s  . TONSILLECTOMY   1954  . TOTAL HIP ARTHROPLASTY Left 07/08/2015  . TOTAL HIP ARTHROPLASTY Left 07/08/2015   Procedure: LEFT TOTAL HIP ARTHROPLASTY;  Surgeon: Teryl LucyJoshua Landau, MD;  Location: [facility] OR;  Service: Orthopedics;  Laterality: Left;  . TOTAL KNEE ARTHROPLASTY Left 03/2008  . US GUIDED PARACENTESIS [facility])  April 2017    SOCIAL HISTORY:   Social History  Substance Use Topics  . Smoking status: Never Smoker  . Smokeless tobacco: Never Used  . Alcohol use 0.0 oz/week     Comment: 07/08/2015 former Heavy ETOH abuse.; "haven't had a drink in 2017; AA told me I didn't need to come back"    FAMILY HISTORY:   Family History  Problem Relation Age of Onset  . Melanoma Father   . Lung cancer Father     DRUG ALLERGIES:  No Known Allergies  REVIEW OF SYSTEMS:   Review of Systems  Constitutional: Negative.  Negative for chills, fever and malaise/fatigue.  HENT: Negative.  Negative for ear discharge, ear pain, hearing loss, nosebleeds and sore throat.   Eyes: Negative.  Negative for blurred vision and pain.  Respiratory: Negative.  Negative for cough, hemoptysis, shortness of breath and wheezing.   Cardiovascular: Negative.  Negative for chest pain, palpitations and leg swelling.  Gastrointestinal: Positive for melena and nausea.  Negative for abdominal pain, blood in stool, diarrhea and vomiting.  Genitourinary: Negative.  Negative for dysuria.  Musculoskeletal: Negative.  Negative for back pain.  Skin: Negative.   Neurological: Negative for dizziness, tremors, speech change, focal weakness, seizures and headaches.  Endo/Heme/Allergies: Negative.  Does not bruise/bleed easily.  Psychiatric/Behavioral: Negative.  Negative for depression, hallucinations and suicidal ideas.    MEDICATIONS AT HOME:   Prior to Admission medications   Medication Sig Start Date End Date Taking? Authorizing Provider  atorvastatin (LIPITOR) 10 MG tablet Take 1 tablet (10 mg total) by mouth daily. 05/20/15   Enid Baasadhika  Kalisetti, MD  baclofen (LIORESAL) 10 MG tablet Take 1 tablet (10 mg total) by mouth 3 (three) times daily. As needed for muscle spasm 07/08/15   Teryl LucyJoshua Landau, MD  buPROPion (WELLBUTRIN XL) 150 MG 24 hr tablet Take 1 tablet (150 mg total) by mouth daily. 05/20/15   Enid Baasadhika Kalisetti, MD  ferrous sulfate 325 (65 FE) MG tablet Take 325 mg by mouth daily with breakfast.    Historical Provider, MD  furosemide (LASIX) 40 MG tablet Take 1 tablet (40 mg total) by mouth daily. 05/20/15   Enid Baasadhika Kalisetti, MD  Lactulose 20 GM/30ML SOLN Take 30 mLs (20 g total) by mouth 2 (two) times daily. HOLD FOR > 3 bowel movements /day 05/20/15   Enid Baasadhika Kalisetti, MD  losartan-hydrochlorothiazide (HYZAAR) 50-12.5 MG tablet Take 1 tablet by mouth daily. 05/20/15   Enid Baasadhika Kalisetti, MD  Multiple Vitamin (MULTIVITAMIN WITH MINERALS) TABS tablet Take 1 tablet by mouth daily.    Historical Provider, MD  ondansetron (ZOFRAN) 4 MG tablet Take 1 tablet (4 mg total) by mouth every 8 (eight) hours as needed for nausea or vomiting. 07/08/15   Teryl LucyJoshua Landau, MD  Oxycodone HCl 10 MG TABS Take 1 tablet (10 mg total) by mouth 4 (four) times daily as needed (severe pain). 07/08/15   Teryl LucyJoshua Landau, MD  potassium chloride SA (K-DUR,KLOR-CON) 20 MEQ tablet Take 1 tablet (20 mEq total) by mouth daily. 05/20/15   Enid Baasadhika Kalisetti, MD  rivaroxaban (XARELTO) 10 MG TABS tablet Take 1 tablet (10 mg total) by mouth daily. 07/08/15   Teryl LucyJoshua Landau, MD  spironolactone (ALDACTONE) 25 MG tablet Take 1 tablet (25 mg total) by mouth daily. 05/20/15   Enid Baasadhika Kalisetti, MD      VITAL SIGNS:  Blood pressure 124/78, pulse (!) 106, temperature 99.1 F (37.3 C), temperature source Oral, resp. rate 20, height 5\' 11"  (1.803 m), weight 90.7 kg (200 lb), SpO2 98 %.  PHYSICAL EXAMINATION:   Physical Exam  Constitutional: He is oriented to person, place, and time and well-developed, well-nourished, and in no distress. No distress.  HENT:  Head: Normocephalic.   Eyes: No scleral icterus.  Neck: Normal range of motion. Neck supple. No JVD present. No tracheal deviation present.  Cardiovascular: Normal rate and regular rhythm.  Exam reveals no gallop and no friction rub.   Murmur heard. Pulmonary/Chest: Effort normal and breath sounds normal. No respiratory distress. He has no wheezes. He has no rales. He exhibits no tenderness.  Abdominal: Soft. Bowel sounds are normal. He exhibits distension. He exhibits no mass. There is no tenderness. There is no rebound and no guarding.  Musculoskeletal: Normal range of motion. He exhibits no edema.  Neurological: He is alert and oriented to person, place, and time.  Skin: Skin is warm. No rash noted. No erythema.  Psychiatric: Affect and judgment normal.      LABORATORY PANEL:   CBC  Recent Labs Lab 12/27/15 0952  WBC 18.9*  HGB 7.1*  HCT 21.1*  PLT 126*   ------------------------------------------------------------------------------------------------------------------  Chemistries   Recent Labs Lab 12/27/15 0952  NA 134*  K 3.8  CL 102  CO2 21*  GLUCOSE 126*  BUN 36*  CREATININE 2.17*  CALCIUM 8.0*  AST 51*  ALT 19  ALKPHOS 57  BILITOT 2.5*   ------------------------------------------------------------------------------------------------------------------  Cardiac Enzymes No results for input(s): TROPONINI in the last 168 hours. ------------------------------------------------------------------------------------------------------------------  RADIOLOGY:  No results found.  EKG:  Sinus tachycardia no ST elevation or depression  IMPRESSION AND PLAN:   70 year old male with a history of EtOH related liver cirrhosis who presents with upper GI bleed.  1. Melena with upper GI bleed and currently having melanotic stools in ED: Peripheral IV's placed and fluids provided in the ED Patient will be started on Protonix and octreotide Hemoglobin every 6 hours Further management  after GI consultation and EGD Avoid NSAIDs Nothing by mouth Start ceftriaxone and due to ascites and GI bleed 2. Acute on chronic blood loss anemia with a signed hemoglobin 10: Patient is consented for blood transfusion. Due to ongoing GI blood loss he will receive 2 units of packed red blood cells   3 Acute kidney injury on CKD stage 3 : Hold nephrotoxic agents Nephrology consultation Monitor for hepatorenal syndrome Consider albumin  4. Alcohol-related cirrhosis with chronic thrombocytopenia: Patient has been noncompliant with Lasix and Aldactone He will likely need paracentesis during this hospital stay If he does receive paracentesis he will need albumin transfusion. NH3 54  5. Leukocytosis: I am suspecting this is due to GIB not infectious etiology Check x-ray and UA   6. OSA: CPAP while in hospital All the records are reviewed and case discussed with ED provider. Management plans discussed with the patient and he is in agreement  CODE STATUS: FULL  CRITICAL CARE TOTAL TIME TAKING CARE OF THIS PATIENT: 55 minutes.  Patient high risk of cardiac arrest with ongoing blood loss and low hemoglobin  Sammantha Mehlhaff M.D on 12/27/2015 at 12:09 PM  Between 7am to 6pm - Pager - [phone]  After 6pm go to [email or web address] - password [email or web address]  [facility]  Office  [phone]  CC: Primary care physician; BABAOFF, Lavada Mesi, MD

## 2015-12-28 ENCOUNTER — Inpatient Hospital Stay: Payer: Commercial Managed Care - HMO

## 2015-12-28 LAB — BASIC METABOLIC PANEL
Anion gap: 5 (ref 5–15)
BUN: 40 mg/dL — AB (ref 6–20)
CALCIUM: 7.8 mg/dL — AB (ref 8.9–10.3)
CHLORIDE: 108 mmol/L (ref 101–111)
CO2: 23 mmol/L (ref 22–32)
CREATININE: 2.17 mg/dL — AB (ref 0.61–1.24)
GFR calc non Af Amer: 29 mL/min — ABNORMAL LOW (ref >60)
GFR, EST AFRICAN AMERICAN: 34 mL/min — AB (ref >60)
Glucose, Bld: 112 mg/dL — ABNORMAL HIGH (ref 65–99)
Potassium: 4.3 mmol/L (ref 3.5–5.1)
SODIUM: 136 mmol/L (ref 135–145)

## 2015-12-28 LAB — CBC
HCT: 22.8 % — ABNORMAL LOW (ref 40.0–52.0)
Hemoglobin: 7.8 g/dL — ABNORMAL LOW (ref 13.0–18.0)
MCH: 32.1 pg (ref 26.0–34.0)
MCHC: 34 g/dL (ref 32.0–36.0)
MCV: 94.4 fL (ref 80.0–100.0)
PLATELETS: 87 10*3/uL — AB (ref 150–440)
RBC: 2.42 MIL/uL — AB (ref 4.40–5.90)
RDW: 18.9 % — AB (ref 11.5–14.5)
WBC: 10.2 10*3/uL (ref 3.8–10.6)

## 2015-12-28 LAB — URINALYSIS COMPLETE WITH MICROSCOPIC (ARMC ONLY)
Bilirubin Urine: NEGATIVE
GLUCOSE, UA: NEGATIVE mg/dL
Hgb urine dipstick: NEGATIVE
KETONES UR: NEGATIVE mg/dL
LEUKOCYTES UA: NEGATIVE
NITRITE: NEGATIVE
PROTEIN: NEGATIVE mg/dL
SPECIFIC GRAVITY, URINE: 1.018 (ref 1.005–1.030)
pH: 5 (ref 5.0–8.0)

## 2015-12-28 LAB — TYPE AND SCREEN
ABO/RH(D): A POS
ANTIBODY SCREEN: NEGATIVE
UNIT DIVISION: 0
Unit division: 0

## 2015-12-28 LAB — GLUCOSE, CAPILLARY: Glucose-Capillary: 168 mg/dL — ABNORMAL HIGH (ref 65–99)

## 2015-12-28 MED ORDER — IRON SUCROSE 20 MG/ML IV SOLN
200.0000 mg | Freq: Once | INTRAVENOUS | Status: AC
  Administered 2015-12-28: 200 mg via INTRAVENOUS
  Filled 2015-12-28: qty 10

## 2015-12-28 NOTE — Progress Notes (Addendum)
Gastroenterology Progress Note  Lawrence BalesDavid H West 70 y.o. 07/28/1945   Subjective: Feels ok. Denies abdominal pain. Black stool this morning. Wants jello.  Objective: Vital signs: Vitals:   12/28/15 0715 12/28/15 0800  BP:  120/75  Pulse: 96 100  Resp: 16 16  Temp:     T 97.5  Physical Exam: Gen: lethargic, no acute distress  CV: RRR Chest: CTA B Abd: soft, nontender, nondistended, +BS Ext: no edema  Lab Results:  Recent Labs  12/27/15 0952 12/28/15 0333  NA 134* 136  K 3.8 4.3  CL 102 108  CO2 21* 23  GLUCOSE 126* 112*  BUN 36* 40*  CREATININE 2.17* 2.17*  CALCIUM 8.0* 7.8*    Recent Labs  12/27/15 0952  AST 51*  ALT 19  ALKPHOS 57  BILITOT 2.5*  PROT 5.4*  ALBUMIN 2.2*    Recent Labs  12/27/15 0952  12/27/15 2048 12/28/15 0333  WBC 18.9*  --   --  10.2  HGB 7.1*  < > 7.6* 7.8*  HCT 21.1*  < > 22.3* 22.8*  MCV 99.7  --   --  94.4  PLT 126*  --   --  87*  < > = values in this interval not displayed.    Assessment/Plan: 70 yo with alcoholic cirrhosis and NSAID abuse - s/p GI bleed (melena) with EGD yesterday showing multiple proximal stomach ulcers likely due to NSAIDs. No gastric or esophageal varices seen. Rising serum Cr and seen by renal with renal U/S planned. Would not recommend a paracentesis right now due to rising creatinine unless respiratory difficulties arise warranting a therapeutic tap and then would need Albumin IV. H. Pylori serology pending and would treat if positive. Hgb 7.8 following 2 U PRBCs yesterday and pre-transfusion Hgb 7.1. Would follow H/Hs and hold off on additional blood products unless rebleeding occurs. Continue Protonix drip. D/C Octreotide drip. Start clear liquid diet. Patient questioned evaluation for a liver transplant and advised him that he needs to completely abstain from alcohol for 6 months prior to transplant evaluation but will defer to his local GI doctor to discuss when he is an outpatient. Strongly advised  again to stop all NSAIDs and alcohol when he leaves the hospital.   [name] C. 12/28/2015, 10:21 AM

## 2015-12-28 NOTE — Progress Notes (Signed)
Patient ID: Lawrence LoaDavid H Ciancio, male   DOB: 01/13/1946, 70 y.o.   MRN: [id number]  [facility] PROGRESS NOTE  Sandria BalesDavid H Collings WUJ:[id number] DOB: 07/07/1945 DOA: 12/27/2015 PCP: Rozanna BoxBABAOFF, MARC E, MD  HPI/Subjective: Patient feeling well and offers no complaints. Patient's presented with melena and GI bleed. Feels okay at this point. No abdominal pain. No further bowel movement.  Objective: Vitals:   12/28/15 1200 12/28/15 1300  BP: 119/80 123/83  Pulse: 97 96  Resp: (!) 22 20  Temp: 98.2 F (36.8 C)     Intake/Output Summary (Last 24 hours) at 12/28/15 1428 Last data filed at 12/28/15 1400  Gross per 24 hour  Intake          2690.21 ml  Output              675 ml  Net          2015.21 ml   Filed Weights   12/27/15 0950 12/27/15 1525  Weight: 90.7 kg (200 lb) 101.8 kg (224 lb 6.9 oz)    ROS: Review of Systems  Constitutional: Negative for chills and fever.  Eyes: Negative for blurred vision.  Respiratory: Negative for cough and shortness of breath.   Cardiovascular: Negative for chest pain.  Gastrointestinal: Negative for abdominal pain, constipation, diarrhea, nausea and vomiting.  Genitourinary: Negative for dysuria.  Musculoskeletal: Negative for joint pain.  Neurological: Negative for dizziness and headaches.   Exam: Physical Exam  Constitutional: He is oriented to person, place, and time.  HENT:  Nose: No mucosal edema.  Mouth/Throat: No oropharyngeal exudate or posterior oropharyngeal edema.  Eyes: Conjunctivae, EOM and lids are normal. Pupils are equal, round, and reactive to light.  Neck: No JVD present. Carotid bruit is not present. No edema present. No thyroid mass and no thyromegaly present.  Cardiovascular: S1 normal and S2 normal.  Exam reveals no gallop.   No murmur heard. Pulses:      Dorsalis pedis pulses are 2+ on the right side, and 2+ on the left side.  Respiratory: No respiratory distress. He has no wheezes. He has no rhonchi. He has no rales.   GI: Soft. Bowel sounds are normal. He exhibits distension. There is no tenderness.  Musculoskeletal:       Right ankle: He exhibits swelling.       Left ankle: He exhibits swelling.  Lymphadenopathy:    He has no cervical adenopathy.  Neurological: He is alert and oriented to person, place, and time. No cranial nerve deficit.  Skin: Skin is warm. No rash noted. Nails show no clubbing.  Psychiatric: He has a normal mood and affect.      Data Reviewed: Basic Metabolic Panel:  Recent Labs Lab 12/27/15 0952 12/28/15 0333  NA 134* 136  K 3.8 4.3  CL 102 108  CO2 21* 23  GLUCOSE 126* 112*  BUN 36* 40*  CREATININE 2.17* 2.17*  CALCIUM 8.0* 7.8*   Liver Function Tests:  Recent Labs Lab 12/27/15 0952  AST 51*  ALT 19  ALKPHOS 57  BILITOT 2.5*  PROT 5.4*  ALBUMIN 2.2*    Recent Labs Lab 12/27/15 1127  AMMONIA 54*   CBC:  Recent Labs Lab 12/27/15 0952 12/27/15 1609 12/27/15 2048 12/28/15 0333  WBC 18.9*  --   --  10.2  HGB 7.1* 6.4* 7.6* 7.8*  HCT 21.1* 18.5* 22.3* 22.8*  MCV 99.7  --   --  94.4  PLT 126*  --   --  87*  Recent Results (from the past 240 hour(s))  MRSA PCR Screening     Status: None   Collection Time: 12/27/15  3:27 PM  Result Value Ref Range Status   MRSA by PCR NEGATIVE NEGATIVE Final    Comment:        The GeneXpert MRSA Assay (FDA approved for NASAL specimens only), is one component of a comprehensive MRSA colonization surveillance program. It is not intended to diagnose MRSA infection nor to guide or monitor treatment for MRSA infections.      Studies: Dg Chest 1 View  Result Date: 12/27/2015 CLINICAL DATA:  Dizziness and elevated white count. EXAM: CHEST 1 VIEW COMPARISON:  03/05/2008 chest radiograph FINDINGS: The cardiomediastinal silhouette is unremarkable. Mild bibasilar opacities are noted -most likely representing atelectasis. There is no evidence of pulmonary edema, suspicious pulmonary nodule/mass, pleural  effusion, or pneumothorax. No acute bony abnormalities are identified. IMPRESSION: Mild bibasilar opacities -probably atelectasis. Electronically Signed   By: Harmon PierJeffrey  Hu M.D.   On: 12/27/2015 12:29   [place] Renal  Result Date: 12/28/2015 CLINICAL DATA:  Acute renal failure EXAM: RENAL / URINARY TRACT ULTRASOUND COMPLETE COMPARISON:  None. FINDINGS: Right Kidney: Length: 9.9 cm. Small 9 mm upper pole cyst. No hydronephrosis. Increased echotexture throughout the right kidney. No mass. Left Kidney: Length: 9.9 cm. 14 mm upper pole cyst. Mildly increased echotexture. No hydronephrosis or mass. Bladder: Normal. Moderate ascites noted. IMPRESSION: Mildly increased echotexture bilaterally compatible with chronic medical renal disease. No hydronephrosis. Moderate ascites. Electronically Signed   By: Charlett NoseKevin  Dover M.D.   On: 12/28/2015 11:42    Scheduled Meds: . sodium chloride   Intravenous Once  . cefTRIAXone  1 g Intravenous Q24H  . iron sucrose  200 mg Intravenous Once  . sodium chloride flush  3 mL Intravenous Q12H   Continuous Infusions: . pantoprozole (PROTONIX) infusion 8 mg/hr (12/28/15 1041)    Assessment/Plan:  1. Upper GI bleed with melena secondary to gastric ulcers seen on EGD. Patient still on Protonix drip.  Must avoid NSAIDs and alcohol. Stop Rocephin since this is not a variceal bleed. Patient on clear liquid diet. 2. Acute hemorrhagic blood loss anemia. Patient received 2 units of packed red blood cells. Serial hemoglobins. IV iron today. Last hemoglobin 7.8. Continue to watch clinically for rebleeding. 3. Acute kidney injury. Ultrasound of the kidneys show medical renal disease. Continue to watch creatinine and hold Lasix at this time. 4. Alcoholic liver disease with cirrhosis, ascites, auto anticoagulation, thrombocytopenia. Consider paracentesis prior to discharge home.  Code Status:     Code Status Orders        Start     Ordered   12/27/15 1538  Full code  Continuous      12/27/15 1537    Code Status History    Date Active Date Inactive Code Status Order ID Comments User Context   05/19/2015  7:43 PM 05/20/2015  6:19 PM Full Code [id number]  Houston SirenVivek J Sainani, MD Inpatient     Disposition Plan: Patient will need to be watched for the next couple days in the hospital  Consultants:  Gastroenterology  Procedures:  Endoscopy  Time spent: 25 minutes  Alford HighlandWIETING, [place] Marchiano  [facility]

## 2015-12-28 NOTE — Progress Notes (Signed)
Pharmacy Antibiotic Note  Jorja LoaDavid H Saintvil is a 70 y.o. male admitted on 12/27/2015 with GI bleed and alcoholic liver cirrhosis.  Pharmacy has been consulted for ceftriaxone dosing.  Plan: Continue ceftriaxone 1 g IV q 24 hours. Total antibiotic duration is 7 days of therapy for variceal bleed. Will continue to monitor and adjust per consult.  Height: 5\' 11"  (180.3 cm) Weight: 224 lb 6.9 oz (101.8 kg) IBW/kg (Calculated) : 75.3  Temp (24hrs), Avg:98.1 F (36.7 C), Min:97.5 F (36.4 C), Max:98.5 F (36.9 C)   Recent Labs Lab 12/27/15 0952 12/28/15 0333  WBC 18.9* 10.2  CREATININE 2.17* 2.17*    Estimated Creatinine Clearance: 39 mL/min (by C-G formula based on SCr of 2.17 mg/dL (H)).    No Known Allergies  Antimicrobials this admission: Ceftriaxone 11/18 >>    Dose adjustments this admission:   Microbiology results:  BCx:   UCx:    Sputum:    MRSA PCR:   Thank you for allowing pharmacy to be a part of this patient's care.  Demetrius Charityeldrin D. Nechelle Petrizzo, PharmD  11:31 AM

## 2015-12-28 NOTE — Addendum Note (Signed)
Addendum  created 12/28/15 1802 by Yves DillPaul Felicitas Sine, MD   Sign clinical note

## 2015-12-28 NOTE — Anesthesia Postprocedure Evaluation (Signed)
Anesthesia Post Note  Patient: Lawrence West  Procedure(s) Performed: Procedure(s) (LRB): ESOPHAGOGASTRODUODENOSCOPY (EGD) (N/A)  Patient location during evaluation: PACU Anesthesia Type: General Level of consciousness: awake and alert and oriented Pain management: pain level controlled Vital Signs Assessment: post-procedure vital signs reviewed and stable Respiratory status: spontaneous breathing Cardiovascular status: blood pressure returned to baseline Anesthetic complications: no    Last Vitals:  Vitals:   12/28/15 1000 12/28/15 1200  BP: (!) 113/58 119/80  Pulse: 98 97  Resp: 17 (!) 22  Temp:  36.8 C    Last Pain:  Vitals:   12/28/15 0200  TempSrc: Axillary  PainSc: 0-No pain                 Brennden Masten

## 2015-12-28 NOTE — Progress Notes (Signed)
Report called to A. Roxan Hockeyobinson RN on [employer]

## 2015-12-28 NOTE — Progress Notes (Signed)
Called to  ICU and ED nurse that admitted patient to look for house keys but per staff, there were no house keys surrendered by EMS.Most likely patient 's friend has the keys.

## 2015-12-28 NOTE — Consult Note (Signed)
[facility]  CONSULT NOTE    Date: 12/28/2015                  Patient Name:  Lawrence West  MRN: [id number]  DOB: [birth year]  Age / Sex: 70 y.o., male         PCP: BABAOFF, Lavada Mesi, MD                 Service Requesting Consult: Dr. Renae Gloss                 Reason for Consult: Acute Renal Failure            History of Present Illness: Mr. Lawrence West is a 70 y.o. white male with hepatic cirrhosis, depression, alcohol abuse, ascites, obstructive sleep apnea, narcolepsy, gout, who was admitted to [facility] on 12/27/2015 for Elevated white blood cell count [D72.829] Gastrointestinal hemorrhage, unspecified gastrointestinal hemorrhage type [K92.2]   Patient admitted with GIB. Endoscopy with EGD no showing active bleeding. Multiple gastric ulcers. 1 unit PRBC.   Creatinine of 2.17, baseline of 1.    Medications: Outpatient medications: Prescriptions Prior to Admission  Medication Sig Dispense Refill Last Dose  . atorvastatin (LIPITOR) 10 MG tablet Take 1 tablet (10 mg total) by mouth daily. 30 tablet 2 Past Week at Unknown time  . bismuth subsalicylate (PEPTO BISMOL) 262 MG/15ML suspension Take 30 mLs by mouth every 6 (six) hours as needed.   12/26/2015 at Unknown time  . ferrous sulfate 325 (65 FE) MG tablet Take 325 mg by mouth daily with breakfast.   Past Week at Unknown time  . furosemide (LASIX) 40 MG tablet Take 1 tablet (40 mg total) by mouth daily. 30 tablet 2 Past Week at Unknown time  . loperamide (IMODIUM A-D) 2 MG tablet Take 2 mg by mouth 4 (four) times daily as needed for diarrhea or loose stools.   12/26/2015 at Unknown time  . Multiple Vitamin (MULTIVITAMIN WITH MINERALS) TABS tablet Take 1 tablet by mouth daily.   Past Week at Unknown time  . ondansetron (ZOFRAN) 4 MG tablet Take 1 tablet (4 mg total) by mouth every 8 (eight) hours as needed for nausea or vomiting. 30 tablet 0 Past Week at Unknown time    Current medications: Current  Facility-Administered Medications  Medication Dose Route Frequency Provider Last Rate Last Dose  . 0.9 %  sodium chloride infusion   Intravenous Continuous Charlott Rakes, MD      . 0.9 %  sodium chloride infusion   Intravenous Once Charlott Rakes, MD      . cefTRIAXone (ROCEPHIN) IVPB 1 g  1 g Intravenous Q24H Rolm Baptise, RPH      . fentaNYL (SUBLIMAZE) injection 25 mcg  25 mcg Intravenous Q5 min PRN Yves Dill, MD      . octreotide (SANDOSTATIN) 500 mcg in sodium chloride 0.9 % 250 mL (2 mcg/mL) infusion  25 mcg/hr Intravenous Continuous Alford Highland, MD 12.5 mL/hr at 12/28/15 0729 25 mcg/hr at 12/28/15 0729  . pantoprazole (PROTONIX) 80 mg in sodium chloride 0.9 % 250 mL (0.32 mg/mL) infusion  8 mg/hr Intravenous Continuous Minna Antis, MD 25 mL/hr at 12/27/15 2209 8 mg/hr at 12/27/15 2209  . pantoprazole (PROTONIX) injection 40 mg  40 mg Intravenous Q12H Sital Mody, MD      . sodium chloride flush (NS) 0.9 % injection 3 mL  3 mL Intravenous Q12H Adrian Saran, MD   3 mL at 12/27/15 2200  Allergies: No Known Allergies    Past Medical History: Past Medical History:  Diagnosis Date  . Arthritis    "left knee" (07/08/2015)  . Ascites   . Avascular necrosis of bone of left hip (HCC) 07/08/2015  . Brown recluse spider bite 1990s   "they thought I was gonna die then"  . Cirrhosis of liver (HCC)    Sees Dr. Sung Amabile  . Depression   . Edema of both legs    Takes Lasix  . Gout X 1  . High cholesterol   . Hypertension   . Narcolepsy   . Rheumatic fever   . Sleep apnea    pt stated "I have sleep apnea but I do not use the machine...Marland KitchenMarland KitchenMarland Kitchenit broke" (07/08/2015)     Past Surgical History: Past Surgical History:  Procedure Laterality Date  . COSMETIC SURGERY  ~ 1997   "nose done, lip drop, chin lift"  . FOOT FRACTURE SURGERY Right   . FRACTURE SURGERY    . INGUINAL HERNIA REPAIR Bilateral 1990s  . TONSILLECTOMY  1954  . TOTAL HIP ARTHROPLASTY Left 07/08/2015   . TOTAL HIP ARTHROPLASTY Left 07/08/2015   Procedure: LEFT TOTAL HIP ARTHROPLASTY;  Surgeon: Teryl Lucy, MD;  Location: [facility] OR;  Service: Orthopedics;  Laterality: Left;  . TOTAL KNEE ARTHROPLASTY Left 03/2008  . US GUIDED PARACENTESIS [facility] HX)  April 2017     Family History: Family History  Problem Relation Age of Onset  . Melanoma Father   . Lung cancer Father      Social History: Social History   Social History  . Marital status: Single    Spouse name: N/A  . Number of children: N/A  . Years of education: N/A   Occupational History  . Not on file.   Social History Main Topics  . Smoking status: Never Smoker  . Smokeless tobacco: Never Used  . Alcohol use 0.0 oz/week     Comment: 07/08/2015 former Heavy ETOH abuse.; "haven't had a drink in 2017; AA told me I didn't need to come back"  . Drug use:     Types: Marijuana     Comment: 07/08/2015 "just in my early 20s"  . Sexual activity: No   Other Topics Concern  . Not on file   Social History Narrative  . No narrative on file     Review of Systems: Review of Systems  Constitutional: Negative.  Negative for chills, diaphoresis, fever, malaise/fatigue and weight loss.  HENT: Negative.  Negative for congestion, ear discharge, ear pain, hearing loss, nosebleeds, sinus pain, sore throat and tinnitus.   Eyes: Negative.  Negative for blurred vision, double vision, photophobia, pain, discharge and redness.  Respiratory: Negative.  Negative for cough, hemoptysis, sputum production, shortness of breath, wheezing and stridor.   Cardiovascular: Negative.  Negative for chest pain, palpitations, orthopnea, claudication, leg swelling and PND.  Gastrointestinal: Positive for diarrhea. Negative for abdominal pain, blood in stool, constipation, heartburn, melena, nausea and vomiting.  Genitourinary: Negative.  Negative for dysuria, flank pain, frequency, hematuria and urgency.  Musculoskeletal: Negative.  Negative for back pain,  falls, joint pain, myalgias and neck pain.  Skin: Negative.  Negative for itching and rash.  Neurological: Negative.  Negative for dizziness, tingling, tremors, sensory change, speech change, focal weakness, seizures, loss of consciousness, weakness and headaches.  Endo/Heme/Allergies: Negative.  Negative for environmental allergies and polydipsia. Does not bruise/bleed easily.  Psychiatric/Behavioral: Negative.  Negative for depression, hallucinations, memory loss, substance abuse and suicidal ideas.  The patient is not nervous/anxious and does not have insomnia.     Vital Signs: Blood pressure 120/75, pulse 100, temperature 97.5 F (36.4 C), temperature source Axillary, resp. rate 16, height 5\' 11"  (1.803 m), weight 101.8 kg (224 lb 6.9 oz), SpO2 97 %.  Weight trends: Filed Weights   12/27/15 0950 12/27/15 1525  Weight: 90.7 kg (200 lb) 101.8 kg (224 lb 6.9 oz)    Physical Exam: General: NAD, laying bed  Head: Normocephalic, atraumatic. Moist oral mucosal membranes  Eyes: Anicteric, PERRL  Neck: Supple, trachea midline  Lungs:  Clear to auscultation  Heart: Regular rate and rhythm  Abdomen:  Soft, nontender, +ascites  Extremities: no peripheral edema.  Neurologic: Nonfocal, moving all four extremities  Skin: No lesions        Lab results: Basic Metabolic Panel:  Recent Labs Lab 12/27/15 0952 12/28/15 0333  NA 134* 136  K 3.8 4.3  CL 102 108  CO2 21* 23  GLUCOSE 126* 112*  BUN 36* 40*  CREATININE 2.17* 2.17*  CALCIUM 8.0* 7.8*    Liver Function Tests:  Recent Labs Lab 12/27/15 0952  AST 51*  ALT 19  ALKPHOS 57  BILITOT 2.5*  PROT 5.4*  ALBUMIN 2.2*   No results for input(s): LIPASE, AMYLASE in the last 168 hours.  Recent Labs Lab 12/27/15 1127  AMMONIA 54*    CBC:  Recent Labs Lab 12/27/15 0952 12/27/15 1609 12/27/15 2048 12/28/15 0333  WBC 18.9*  --   --  10.2  HGB 7.1* 6.4* 7.6* 7.8*  HCT 21.1* 18.5* 22.3* 22.8*  MCV 99.7  --   --   94.4  PLT 126*  --   --  87*    Cardiac Enzymes: No results for input(s): CKTOTAL, CKMB, CKMBINDEX, TROPONINI in the last 168 hours.  BNP: Invalid input(s): POCBNP  CBG: No results for input(s): GLUCAP in the last 168 hours.  Microbiology: Results for orders placed or performed during the hospital encounter of 12/27/15  MRSA PCR Screening     Status: None   Collection Time: 12/27/15  3:27 PM  Result Value Ref Range Status   MRSA by PCR NEGATIVE NEGATIVE Final    Comment:        The GeneXpert MRSA Assay (FDA approved for NASAL specimens only), is one component of a comprehensive MRSA colonization surveillance program. It is not intended to diagnose MRSA infection nor to guide or monitor treatment for MRSA infections.     Coagulation Studies:  Recent Labs  12/27/15 0953 12/27/15 1609  LABPROT 20.4* 20.3*  INR 1.72 1.71    Urinalysis:  Recent Labs  12/28/15 0105  COLORURINE AMBER*  LABSPEC 1.018  PHURINE 5.0  GLUCOSEU NEGATIVE  HGBUR NEGATIVE  BILIRUBINUR NEGATIVE  KETONESUR NEGATIVE  PROTEINUR NEGATIVE  NITRITE NEGATIVE  LEUKOCYTESUR NEGATIVE      Imaging: Dg Chest 1 View  Result Date: 12/27/2015 CLINICAL DATA:  Dizziness and elevated white count. EXAM: CHEST 1 VIEW COMPARISON:  03/05/2008 chest radiograph FINDINGS: The cardiomediastinal silhouette is unremarkable. Mild bibasilar opacities are noted -most likely representing atelectasis. There is no evidence of pulmonary edema, suspicious pulmonary nodule/mass, pleural effusion, or pneumothorax. No acute bony abnormalities are identified. IMPRESSION: Mild bibasilar opacities -probably atelectasis. Electronically Signed   By: Harmon PierJeffrey  Hu M.D.   On: 12/27/2015 12:29      Assessment & Plan: Mr. Jorja LoaDavid H Sindoni is a 70 y.o. white male with hepatic cirrhosis, depression, alcohol abuse, ascites, obstructive sleep apnea, narcolepsy, gout,  [facility] for Elevated white blood cell  count [D72.829] Gastrointestinal hemorrhage, unspecified gastrointestinal hemorrhage type [K92.2]   1. Acute renal failure: secondary to hypotension and volume loss. Agree with gentle IV replacement. Monitor urine output.  Concern for hepatorenal syndrome - renal ultrasound  2. Anemia with renal failure: acute blood loss from GI bleed. Status post 1 unit PRBC on 11/18  3. Alcoholic hepatic cirrhosis: with ongoing alcohol abuse.  With ascites - consider paracentesis    LOS: 1 Prince Olivier 11/19/20178:39 AM

## 2015-12-28 NOTE — Progress Notes (Signed)
Notified Hospitalitist of the patient's large maroon liquid bowel movement per rectum. Patient blood pressure and heart rate is stable and WNL. Cleaned patient and patient resting in bed comfortably. Will continue to monitor and assess patient.

## 2015-12-29 ENCOUNTER — Encounter: Payer: Self-pay | Admitting: Gastroenterology

## 2015-12-29 ENCOUNTER — Inpatient Hospital Stay: Payer: Commercial Managed Care - HMO

## 2015-12-29 LAB — RENAL FUNCTION PANEL
ANION GAP: 5 (ref 5–15)
Albumin: 2.2 g/dL — ABNORMAL LOW (ref 3.5–5.0)
BUN: 32 mg/dL — ABNORMAL HIGH (ref 6–20)
CALCIUM: 7.9 mg/dL — AB (ref 8.9–10.3)
CO2: 23 mmol/L (ref 22–32)
CREATININE: 1.79 mg/dL — AB (ref 0.61–1.24)
Chloride: 104 mmol/L (ref 101–111)
GFR calc Af Amer: 43 mL/min — ABNORMAL LOW (ref >60)
GFR calc non Af Amer: 37 mL/min — ABNORMAL LOW (ref >60)
GLUCOSE: 143 mg/dL — AB (ref 65–99)
Phosphorus: 3.1 mg/dL (ref 2.5–4.6)
Potassium: 3.7 mmol/L (ref 3.5–5.1)
SODIUM: 132 mmol/L — AB (ref 135–145)

## 2015-12-29 LAB — HEMOGLOBIN: Hemoglobin: 8.2 g/dL — ABNORMAL LOW (ref 13.0–18.0)

## 2015-12-29 LAB — GLUCOSE, CAPILLARY
GLUCOSE-CAPILLARY: 106 mg/dL — AB (ref 65–99)
GLUCOSE-CAPILLARY: 140 mg/dL — AB (ref 65–99)
Glucose-Capillary: 110 mg/dL — ABNORMAL HIGH (ref 65–99)
Glucose-Capillary: 120 mg/dL — ABNORMAL HIGH (ref 65–99)

## 2015-12-29 MED ORDER — OXYCODONE HCL 5 MG PO TABS: 5.0000 mg | ORAL_TABLET | Freq: Four times a day (QID) | ORAL | Status: DC | PRN

## 2015-12-29 MED ORDER — PANTOPRAZOLE SODIUM 40 MG PO TBEC
40.0000 mg | DELAYED_RELEASE_TABLET | Freq: Two times a day (BID) | ORAL | Status: DC
  Administered 2015-12-29 – 2015-12-30 (×3): 40 mg via ORAL
  Filled 2015-12-29 (×4): qty 1

## 2015-12-29 NOTE — Progress Notes (Signed)
Patient ID: Lawrence West, male   DOB: 03/09/1945, 70 y.o.   MRN: [id number]  [employer] PROGRESS NOTE  Lawrence West WUJ:[id number] DOB: 04/20/1945 DOA: 12/27/2015 PCP: Rozanna BoxBABAOFF, MARC E, MD  HPI/Subjective: Patient denies further bleeding. He has no abdominal pain complains of abdominal distention. Feels very weak   Objective: Vitals:   12/29/15 1304 12/29/15 1351  BP: 129/83 127/82  Pulse: 82 92  Resp: 18 18  Temp:      Intake/Output Summary (Last 24 hours) at 12/29/15 1412 Last data filed at 12/29/15 1014  Gross per 24 hour  Intake          1114.75 ml  Output              500 ml  Net           614.75 ml   Filed Weights   12/27/15 0950 12/27/15 1525  Weight: 200 lb (90.7 kg) 224 lb 6.9 oz (101.8 kg)    ROS: Review of Systems  Constitutional: Negative for chills and fever.  Eyes: Negative for blurred vision.  Respiratory: Negative for cough and shortness of breath.   Cardiovascular: Negative for chest pain.  Gastrointestinal: Negative for abdominal pain, constipation, diarrhea, nausea and vomiting.  Genitourinary: Negative for dysuria.  Musculoskeletal: Negative for joint pain.  Neurological: Negative for dizziness and headaches.   Exam: Physical Exam  Constitutional: He is oriented to person, place, and time.  HENT:  Nose: No mucosal edema.  Mouth/Throat: No oropharyngeal exudate or posterior oropharyngeal edema.  Eyes: Conjunctivae, EOM and lids are normal. Pupils are equal, round, and reactive to light.  Neck: No JVD present. Carotid bruit is not present. No edema present. No thyroid mass and no thyromegaly present.  Cardiovascular: S1 normal and S2 normal.  Exam reveals no gallop.   No murmur heard. Pulses:      Dorsalis pedis pulses are 2+ on the right side, and 2+ on the left side.  Respiratory: No respiratory distress. He has no wheezes. He has no rhonchi. He has no rales.  GI: Soft. Bowel sounds are normal. He exhibits distension. There is no  tenderness.  Musculoskeletal:       Right ankle: He exhibits swelling.       Left ankle: He exhibits swelling.  Lymphadenopathy:    He has no cervical adenopathy.  Neurological: He is alert and oriented to person, place, and time. No cranial nerve deficit.  Skin: Skin is warm. No rash noted. Nails show no clubbing.  Psychiatric: He has a normal mood and affect.      Data Reviewed: Basic Metabolic Panel:  Recent Labs Lab 12/27/15 0952 12/28/15 0333 12/29/15 0408  NA 134* 136 132*  K 3.8 4.3 3.7  CL 102 108 104  CO2 21* 23 23  GLUCOSE 126* 112* 143*  BUN 36* 40* 32*  CREATININE 2.17* 2.17* 1.79*  CALCIUM 8.0* 7.8* 7.9*  PHOS  --   --  3.1   Liver Function Tests:  Recent Labs Lab 12/27/15 0952 12/29/15 0408  AST 51*  --   ALT 19  --   ALKPHOS 57  --   BILITOT 2.5*  --   PROT 5.4*  --   ALBUMIN 2.2* 2.2*    Recent Labs Lab 12/27/15 1127  AMMONIA 54*   CBC:  Recent Labs Lab 12/27/15 0952 12/27/15 1609 12/27/15 2048 12/28/15 0333 12/29/15 0408  WBC 18.9*  --   --  10.2  --   HGB 7.1*  6.4* 7.6* 7.8* 8.2*  HCT 21.1* 18.5* 22.3* 22.8*  --   MCV 99.7  --   --  94.4  --   PLT 126*  --   --  87*  --      Recent Results (from the past 240 hour(s))  MRSA PCR Screening     Status: None   Collection Time: 12/27/15  3:27 PM  Result Value Ref Range Status   MRSA by PCR NEGATIVE NEGATIVE Final    Comment:        The GeneXpert MRSA Assay (FDA approved for NASAL specimens only), is one component of a comprehensive MRSA colonization surveillance program. It is not intended to diagnose MRSA infection nor to guide or monitor treatment for MRSA infections.      Studies: [place] Renal  Result Date: 12/28/2015 CLINICAL DATA:  Acute renal failure EXAM: RENAL / URINARY TRACT ULTRASOUND COMPLETE COMPARISON:  None. FINDINGS: Right Kidney: Length: 9.9 cm. Small 9 mm upper pole cyst. No hydronephrosis. Increased echotexture throughout the right kidney. No mass. Left  Kidney: Length: 9.9 cm. 14 mm upper pole cyst. Mildly increased echotexture. No hydronephrosis or mass. Bladder: Normal. Moderate ascites noted. IMPRESSION: Mildly increased echotexture bilaterally compatible with chronic medical renal disease. No hydronephrosis. Moderate ascites. Electronically Signed   By: Charlett NoseKevin  Dover M.D.   On: 12/28/2015 11:42    Scheduled Meds: . sodium chloride   Intravenous Once  . pantoprazole  40 mg Oral BID  . sodium chloride flush  3 mL Intravenous Q12H   Continuous Infusions:   Assessment/Plan:  1. Upper GI bleed with melena secondary to gastric ulcers seen on EGD. DC Protonix drip placed on oral Protonix  Must avoid NSAIDs and alcohol. Hemoglobin stable we will advance diet  2. Acute hemorrhagic blood loss anemia. Patient received 2 units of packed red blood cells. Hemoglobin is stable 3. Acute kidney injury. Ultrasound of the kidneys show medical renal disease. Improved with IV fluids continue to monitor likely has chronic kidney disease 4. Alcoholic liver disease with cirrhosis, ascites, auto anticoagulation, thrombocytopenia.  I will order therapeutic paracentesis  CentesisCode Status:     Code Status Orders        Start     Ordered   12/27/15 1538  Full code  Continuous     12/27/15 1537    Code Status History    Date Active Date Inactive Code Status Order ID Comments User Context   05/19/2015  7:43 PM 05/20/2015  6:19 PM Full Code [id number]  Houston SirenVivek J Sainani, MD Inpatient     Disposition Plan: Patient will need to be watched for the next couple days in the hospital  Consultants:  Gastroenterology  Procedures:  Endoscopy  Time spent: 25 minutes  Ioanna Colquhoun, [facility]  [employer]

## 2015-12-29 NOTE — Procedures (Signed)
Successful US guided paracentesis yielding 5.25 L of serous ascitic fluid. Sample sent to laboratory as requested. EBL: None No immediate post procedural complications.   Katherina RightJay Ellinor Test, MD Pager #: [phone]

## 2015-12-29 NOTE — Progress Notes (Signed)
Pt alert and sitting up in bed. Spoke of his career and family relationships. CH offered prayer.   12/29/15 1050  Clinical Encounter Type  Visited With Patient  Visit Type Initial;Spiritual support  Referral From Nurse  Spiritual Encounters  Spiritual Needs Prayer  Stress Factors  Patient Stress Factors Health changes

## 2015-12-29 NOTE — Progress Notes (Signed)
PT Cancellation Note  Patient Details Name: Lawrence West MRN: [id number] DOB: 08/06/1945   Cancelled Treatment:    Reason Eval/Treat Not Completed: Patient at procedure or test/unavailable.  Pt currently not in room (pt is off floor for procedure/testing).  Will re-attempt PT eval at a later date/time.   Irving Burtonmily Darnell Jeschke 12/29/2015, 2:24 PM Hendricks LimesEmily Fidel Caggiano, PT [phone]

## 2015-12-29 NOTE — Progress Notes (Signed)
Subjective:   Overall doing fair Reports abdominal distention Serum creatinine improved to 1.79 down from 2.17  Objective:  Vital signs in last 24 hours:  Temp:  [97.4 F (36.3 C)-98.1 F (36.7 C)] 98.1 F (36.7 C) (11/20 1227) Pulse Rate:  [82-98] 91 (11/20 1441) Resp:  [18-21] 18 (11/20 1441) BP: (111-131)/(70-83) 131/76 (11/20 1441) SpO2:  [93 %-98 %] 95 % (11/20 1441)  Weight change:  Filed Weights   12/27/15 0950 12/27/15 1525  Weight: 90.7 kg (200 lb) 101.8 kg (224 lb 6.9 oz)    Intake/Output:    Intake/Output Summary (Last 24 hours) at 12/29/15 1600 Last data filed at 12/29/15 1014  Gross per 24 hour  Intake          1064.75 ml  Output              500 ml  Net           564.75 ml     Physical Exam: General: Laying in the bed, no acute distress  HEENT Moist oral mucous membranes  Neck supple  Pulm/lungs Normal effort, clear  CVS/Heart Regular, no rub or gallop  Abdomen:  Soft, distended  Extremities: Trace edema  Neurologic: Alert, oriented  Skin: Warm, dry          Basic Metabolic Panel:   Recent Labs Lab 12/27/15 0952 12/28/15 0333 12/29/15 0408  NA 134* 136 132*  K 3.8 4.3 3.7  CL 102 108 104  CO2 21* 23 23  GLUCOSE 126* 112* 143*  BUN 36* 40* 32*  CREATININE 2.17* 2.17* 1.79*  CALCIUM 8.0* 7.8* 7.9*  PHOS  --   --  3.1     CBC:  Recent Labs Lab 12/27/15 0952 12/27/15 1609 12/27/15 2048 12/28/15 0333 12/29/15 0408  WBC 18.9*  --   --  10.2  --   HGB 7.1* 6.4* 7.6* 7.8* 8.2*  HCT 21.1* 18.5* 22.3* 22.8*  --   MCV 99.7  --   --  94.4  --   PLT 126*  --   --  87*  --       Microbiology:  Recent Results (from the past 720 hour(s))  MRSA PCR Screening     Status: None   Collection Time: 12/27/15  3:27 PM  Result Value Ref Range Status   MRSA by PCR NEGATIVE NEGATIVE Final    Comment:        The GeneXpert MRSA Assay (FDA approved for NASAL specimens only), is one component of a comprehensive MRSA  colonization surveillance program. It is not intended to diagnose MRSA infection nor to guide or monitor treatment for MRSA infections.     Coagulation Studies:  Recent Labs  12/27/15 0953 12/27/15 1609  LABPROT 20.4* 20.3*  INR 1.72 1.71    Urinalysis:  Recent Labs  12/28/15 0105  COLORURINE AMBER*  LABSPEC 1.018  PHURINE 5.0  GLUCOSEU NEGATIVE  HGBUR NEGATIVE  BILIRUBINUR NEGATIVE  KETONESUR NEGATIVE  PROTEINUR NEGATIVE  NITRITE NEGATIVE  LEUKOCYTESUR NEGATIVE      Imaging: [place] Renal  Result Date: 12/28/2015 CLINICAL DATA:  Acute renal failure EXAM: RENAL / URINARY TRACT ULTRASOUND COMPLETE COMPARISON:  None. FINDINGS: Right Kidney: Length: 9.9 cm. Small 9 mm upper pole cyst. No hydronephrosis. Increased echotexture throughout the right kidney. No mass. Left Kidney: Length: 9.9 cm. 14 mm upper pole cyst. Mildly increased echotexture. No hydronephrosis or mass. Bladder: Normal. Moderate ascites noted. IMPRESSION: Mildly increased echotexture bilaterally compatible with chronic medical renal  disease. No hydronephrosis. Moderate ascites. Electronically Signed   By: Charlett NoseKevin  Dover M.D.   On: 12/28/2015 11:42   [place] Paracentesis  Result Date: 12/29/2015 INDICATION: Recurrent symptomatic ascites. Please perform ultrasound-guided paracentesis for therapeutic and diagnostic purposes. EXAM: ULTRASOUND-GUIDED PARACENTESIS COMPARISON:  None. MEDICATIONS: None. COMPLICATIONS: None immediate. TECHNIQUE: Informed written consent was obtained from the patient after a discussion of the risks, benefits and alternatives to treatment. A timeout was performed prior to the initiation of the procedure. Initial ultrasound scanning demonstrates a moderate to large amount of ascites within the right lower abdominal quadrant. The right lower abdomen was prepped and draped in the usual sterile fashion. 1% lidocaine with epinephrine was used for local anesthesia. An ultrasound image was saved for  documentation purposed. An 8 Fr Safe-T-Centesis catheter was introduced. The paracentesis was performed. The catheter was removed and a dressing was applied. The patient tolerated the procedure well without immediate post procedural complication. FINDINGS: A total of approximately 5.25 liters of serous fluid was removed. Samples were sent to the laboratory as requested by the clinical team. IMPRESSION: Successful ultrasound-guided paracentesis yielding 5.25 liters of peritoneal fluid. Electronically Signed   By: Simonne ComeJohn  Watts M.D.   On: 12/29/2015 15:10     Medications:    . sodium chloride   Intravenous Once  . pantoprazole  40 mg Oral BID  . sodium chloride flush  3 mL Intravenous Q12H   oxyCODONE  Assessment/ Plan:  70 y.o. male with hepatic cirrhosis, depression, alcohol abuse, ascites, obstructive sleep apnea, narcolepsy, gout, who [facility] for Elevated white blood cell count [D72.829] Gastrointestinal hemorrhage, unspecified gastrointestinal hemorrhage type [K92.2]   1. Acute renal failure: secondary to hypotension and volume loss. Agree with gentle IV replacement. Monitor urine output.  Concern for hepatorenal syndrome - serum creatinine improved today - Continue to monitor daily  2. Anemia with renal failure: acute blood loss from GI bleed. Status post 1 unit PRBC on 11/18  3. Alcoholic hepatic cirrhosis: with ongoing alcohol abuse.  With ascites -  Paracentesis today    LOS: 2 Mahir Prabhakar 11/20/20174:00 PM

## 2015-12-30 DIAGNOSIS — K920 Hematemesis: Secondary | ICD-10-CM | POA: Diagnosis present

## 2015-12-30 DIAGNOSIS — N189 Chronic kidney disease, unspecified: Secondary | ICD-10-CM | POA: Diagnosis not present

## 2015-12-30 DIAGNOSIS — D649 Anemia, unspecified: Secondary | ICD-10-CM | POA: Diagnosis not present

## 2015-12-30 DIAGNOSIS — R188 Other ascites: Secondary | ICD-10-CM | POA: Diagnosis not present

## 2015-12-30 DIAGNOSIS — K7030 Alcoholic cirrhosis of liver without ascites: Secondary | ICD-10-CM | POA: Diagnosis not present

## 2015-12-30 DIAGNOSIS — N183 Chronic kidney disease, stage 3 (moderate): Secondary | ICD-10-CM | POA: Diagnosis not present

## 2015-12-30 DIAGNOSIS — K922 Gastrointestinal hemorrhage, unspecified: Secondary | ICD-10-CM | POA: Diagnosis not present

## 2015-12-30 DIAGNOSIS — M6281 Muscle weakness (generalized): Secondary | ICD-10-CM | POA: Diagnosis not present

## 2015-12-30 DIAGNOSIS — N19 Unspecified kidney failure: Secondary | ICD-10-CM | POA: Diagnosis not present

## 2015-12-30 DIAGNOSIS — Z111 Encounter for screening for respiratory tuberculosis: Secondary | ICD-10-CM | POA: Diagnosis not present

## 2015-12-30 DIAGNOSIS — G4733 Obstructive sleep apnea (adult) (pediatric): Secondary | ICD-10-CM | POA: Diagnosis not present

## 2015-12-30 DIAGNOSIS — M109 Gout, unspecified: Secondary | ICD-10-CM | POA: Diagnosis not present

## 2015-12-30 DIAGNOSIS — K7460 Unspecified cirrhosis of liver: Secondary | ICD-10-CM | POA: Diagnosis not present

## 2015-12-30 DIAGNOSIS — D62 Acute posthemorrhagic anemia: Secondary | ICD-10-CM | POA: Diagnosis not present

## 2015-12-30 DIAGNOSIS — I129 Hypertensive chronic kidney disease with stage 1 through stage 4 chronic kidney disease, or unspecified chronic kidney disease: Secondary | ICD-10-CM | POA: Diagnosis not present

## 2015-12-30 DIAGNOSIS — I10 Essential (primary) hypertension: Secondary | ICD-10-CM | POA: Diagnosis not present

## 2015-12-30 DIAGNOSIS — G4730 Sleep apnea, unspecified: Secondary | ICD-10-CM | POA: Diagnosis not present

## 2015-12-30 DIAGNOSIS — M6218 Other rupture of muscle (nontraumatic), other site: Secondary | ICD-10-CM | POA: Diagnosis not present

## 2015-12-30 DIAGNOSIS — R278 Other lack of coordination: Secondary | ICD-10-CM | POA: Diagnosis not present

## 2015-12-30 DIAGNOSIS — E785 Hyperlipidemia, unspecified: Secondary | ICD-10-CM | POA: Diagnosis not present

## 2015-12-30 DIAGNOSIS — R140 Abdominal distension (gaseous): Secondary | ICD-10-CM | POA: Diagnosis not present

## 2015-12-30 DIAGNOSIS — K254 Chronic or unspecified gastric ulcer with hemorrhage: Secondary | ICD-10-CM | POA: Diagnosis not present

## 2015-12-30 DIAGNOSIS — Z79899 Other long term (current) drug therapy: Secondary | ICD-10-CM | POA: Diagnosis not present

## 2015-12-30 DIAGNOSIS — K7031 Alcoholic cirrhosis of liver with ascites: Secondary | ICD-10-CM | POA: Diagnosis not present

## 2015-12-30 DIAGNOSIS — F418 Other specified anxiety disorders: Secondary | ICD-10-CM | POA: Diagnosis not present

## 2015-12-30 LAB — CBC
HCT: 23.7 % — ABNORMAL LOW (ref 40.0–52.0)
HEMOGLOBIN: 8.1 g/dL — AB (ref 13.0–18.0)
MCH: 32.8 pg (ref 26.0–34.0)
MCHC: 34.2 g/dL (ref 32.0–36.0)
MCV: 95.9 fL (ref 80.0–100.0)
PLATELETS: 106 10*3/uL — AB (ref 150–440)
RBC: 2.47 MIL/uL — AB (ref 4.40–5.90)
RDW: 19.2 % — ABNORMAL HIGH (ref 11.5–14.5)
WBC: 6.7 10*3/uL (ref 3.8–10.6)

## 2015-12-30 LAB — BASIC METABOLIC PANEL
ANION GAP: 3 — AB (ref 5–15)
BUN: 20 mg/dL (ref 6–20)
CHLORIDE: 104 mmol/L (ref 101–111)
CO2: 24 mmol/L (ref 22–32)
Calcium: 7.8 mg/dL — ABNORMAL LOW (ref 8.9–10.3)
Creatinine, Ser: 1.32 mg/dL — ABNORMAL HIGH (ref 0.61–1.24)
GFR, EST NON AFRICAN AMERICAN: 53 mL/min — AB (ref >60)
Glucose, Bld: 97 mg/dL (ref 65–99)
POTASSIUM: 3.6 mmol/L (ref 3.5–5.1)
SODIUM: 131 mmol/L — AB (ref 135–145)

## 2015-12-30 LAB — H PYLORI, IGM, IGG, IGA AB
H Pylori IgG: 2.6 U/mL — ABNORMAL HIGH (ref 0.0–0.8)
H. Pylogi, Iga Abs: 32.4 units — ABNORMAL HIGH (ref 0.0–8.9)
H. Pylogi, Igm Abs: 15.4 units — ABNORMAL HIGH (ref 0.0–8.9)

## 2015-12-30 MED ORDER — PANTOPRAZOLE SODIUM 40 MG PO TBEC: 40.0000 mg | DELAYED_RELEASE_TABLET | Freq: Two times a day (BID) | ORAL | 0 refills | Status: AC

## 2015-12-30 MED ORDER — SPIRONOLACTONE 25 MG PO TABS: 25.0000 mg | ORAL_TABLET | Freq: Every day | ORAL | 0 refills | Status: AC

## 2015-12-30 NOTE — Clinical Social Work Note (Signed)
Auth provided by Evette D[facility] thn: [id number]. [place] SpanielMonica Zyra Parrillo MSW,[name] [phone]

## 2015-12-30 NOTE — Clinical Social Work Note (Signed)
Clinical Social Work Assessment  Patient Details  Name: Lawrence LoaDavid H Bednarski MRN: [id number] Date of Birth: 06/24/1945  Date of referral:  12/30/15               Reason for consult:  Facility Placement                Permission sought to share information with:  Facility Medical sales representativeContact Representative, Family Supports Permission granted to share information::  Yes, Verbal Permission Granted  Name::        Agency::     Relationship::     Contact Information:     Housing/Transportation Living arrangements for the past 2 months:  Skilled Building surveyorursing Facility Source of Information:  Patient Patient Interpreter Needed:  None Criminal Activity/Legal Involvement Pertinent to Current Situation/Hospitalization:  No - Comment as needed Significant Relationships:  Siblings, Parents Lives with:  Parents Do you feel safe going back to the place where you live?  Yes Need for family participation in patient care:  Yes (Comment)  Care giving concerns:  Patient has been living with his mother.   Social Worker assessment / plan:  PT informed CSW that they recommend STR for patient. CSW spoke with patient this morning and he stated that he was in agreement with going to a rehab facility at discharge. CSW informed patient that the physician stated he was ready for discharge today. Patient is in agreement. Bedsearch initiated and then bed offer given from [facility]. [facility].   Employment status:  Disabled (Comment on whether or not currently receiving Disability) Insurance information:  Managed Medicare PT Recommendations:  Skilled Nursing Facility Information / Referral to community resources:  Skilled Nursing Facility  Patient/Family's Response to care:  Patient expressed appreciation for CSW assistance.   Patient/Family's Understanding of and Emotional Response to Diagnosis, Current Treatment, and Prognosis: Patient is aware of his current limitations and has accepted  that he needs rehab. Patient very upbeat about going to rehab today.  Emotional Assessment Appearance:  Appears stated age (pleasant and cooperative) Attitude/Demeanor/Rapport:   (pleasant and cooperative) Affect (typically observed):  Accepting, Adaptable, Calm Orientation:  Oriented to Self, Oriented to Place, Oriented to  Time, Oriented to Situation Alcohol / Substance use:  Not Applicable Psych involvement (Current and /or in the community):  No (Comment)  Discharge Needs  Concerns to be addressed:  Care Coordination Readmission within the last 30 days:  No Current discharge risk:  None Barriers to Discharge:  No Barriers Identified   York SpanielMonica Noraa Pickeral, [name] 12/30/2015, 2:04 PM

## 2015-12-30 NOTE — Discharge Summary (Signed)
[facility] [place] [facility]  Lawrence West, 70 y.o., DOB 11/30/1945, MRN [id number]. Admission date: 12/27/2015 Discharge Date 12/30/2015 Primary MD BABAOFF, Lavada MesiMARC E, MD Admitting Physician Adrian SaranSital Mody, MD  Admission Diagnosis  Elevated white blood cell count [D72.829] Gastrointestinal hemorrhage, unspecified gastrointestinal hemorrhage type [K92.2]  Discharge Diagnosis   Active Problems:  GIB (gastrointestinal bleeding)  Gastric ulcer  Liver cirrhosis  Acute renal failure on chronic kidney disease felt to be due to hepatorenal syndrome  Ascites Morbid obesity Generalized weakness and deconditioning Depression Hyperlipidemia History of gout Essential hypertension Sleep apnea      Hospital Course  Lawrence West  is a 70 y.o. male with a known history of Alcohol related liver cirrhosis and sleep apnea not compliant with CPAP who presents with GI bleed. Patient reports since Wednesday he has had dark-colored stools.abdominal pain. He is taking NSAIDs on a daily basis for hip pain. Patient in the ED was noted to have severe melanotic stool. His blood pressure was also low. Patient was transfused 2 units of packed RBCs he was seen in consultation by GI and started on a Protonix drip. Patient underwent endoscopy which showed gastric ulcers who was treated with PPI. Patient's hemoglobin has remained stable. His diet has been advanced. Patient also has worsening ascites therefore he underwent a paracentesis which was therapeutic has a history of having liver cirrhosis in paracentesis in the past. He also was noted to have acute renal failure on chronic kidney disease. He was seen in nephrology. His renal function is stable. Patient is very weak and deconditioned and is in need of rehabilitation.          Consults  GI, nephrology  Significant Tests:  See full reports for all details     Dg Chest 1 View  Result Date: 12/27/2015 CLINICAL DATA:  Dizziness and  elevated white count. EXAM: CHEST 1 VIEW COMPARISON:  03/05/2008 chest radiograph FINDINGS: The cardiomediastinal silhouette is unremarkable. Mild bibasilar opacities are noted -most likely representing atelectasis. There is no evidence of pulmonary edema, suspicious pulmonary nodule/mass, pleural effusion, or pneumothorax. No acute bony abnormalities are identified. IMPRESSION: Mild bibasilar opacities -probably atelectasis. Electronically Signed   By: Harmon PierJeffrey  Hu M.D.   On: 12/27/2015 12:29   [place] Renal  Result Date: 12/28/2015 CLINICAL DATA:  Acute renal failure EXAM: RENAL / URINARY TRACT ULTRASOUND COMPLETE COMPARISON:  None. FINDINGS: Right Kidney: Length: 9.9 cm. Small 9 mm upper pole cyst. No hydronephrosis. Increased echotexture throughout the right kidney. No mass. Left Kidney: Length: 9.9 cm. 14 mm upper pole cyst. Mildly increased echotexture. No hydronephrosis or mass. Bladder: Normal. Moderate ascites noted. IMPRESSION: Mildly increased echotexture bilaterally compatible with chronic medical renal disease. No hydronephrosis. Moderate ascites. Electronically Signed   By: Charlett NoseKevin  Dover M.D.   On: 12/28/2015 11:42   [place] Paracentesis  Result Date: 12/29/2015 INDICATION: Recurrent symptomatic ascites. Please perform ultrasound-guided paracentesis for therapeutic and diagnostic purposes. EXAM: ULTRASOUND-GUIDED PARACENTESIS COMPARISON:  None. MEDICATIONS: None. COMPLICATIONS: None immediate. TECHNIQUE: Informed written consent was obtained from the patient after a discussion of the risks, benefits and alternatives to treatment. A timeout was performed prior to the initiation of the procedure. Initial ultrasound scanning demonstrates a moderate to large amount of ascites within the right lower abdominal quadrant. The right lower abdomen was prepped and draped in the usual sterile fashion. 1% lidocaine with epinephrine was used for local anesthesia. An ultrasound image was saved for documentation  purposed. An 8 Fr Safe-T-Centesis catheter  was introduced. The paracentesis was performed. The catheter was removed and a dressing was applied. The patient tolerated the procedure well without immediate post procedural complication. FINDINGS: A total of approximately 5.25 liters of serous fluid was removed. Samples were sent to the laboratory as requested by the clinical team. IMPRESSION: Successful ultrasound-guided paracentesis yielding 5.25 liters of peritoneal fluid. Electronically Signed   By: Simonne ComeJohn  Watts M.D.   On: 12/29/2015 15:10       Today   Subjective:   Lawrence Acreavid West  Feeling better very weak  Objective:   Blood pressure 107/70, pulse 83, temperature 98 F (36.7 C), temperature source Oral, resp. rate 17, height 5\' 11"  (1.803 m), weight 224 lb 6.9 oz (101.8 kg), SpO2 99 %.  .  Intake/Output Summary (Last 24 hours) at 12/30/15 1337 Last data filed at 12/30/15 1320  Gross per 24 hour  Intake          1856.67 ml  Output              725 ml  Net          1131.67 ml    Exam VITAL SIGNS: Blood pressure 107/70, pulse 83, temperature 98 F (36.7 C), temperature source Oral, resp. rate 17, height 5\' 11"  (1.803 m), weight 224 lb 6.9 oz (101.8 kg), SpO2 99 %.  GENERAL:  70 y.o.-year-old patient lying in the bed with no acute distress.  EYES: Pupils equal, round, reactive to light and accommodation. No scleral icterus. Extraocular muscles intact.  HEENT: Head atraumatic, normocephalic. Oropharynx and nasopharynx clear.  NECK:  Supple, no jugular venous distention. No thyroid enlargement, no tenderness.  LUNGS: Normal breath sounds bilaterally, no wheezing, rales,rhonchi or crepitation. No use of accessory muscles of respiration.  CARDIOVASCULAR: S1, S2 normal. No murmurs, rubs, or gallops.  ABDOMEN: Soft, nontender, positive distended Bowel sounds present. No organomegaly or mass.  EXTREMITIES: No pedal edema, cyanosis, or clubbing.  NEUROLOGIC: Cranial nerves II through XII are  intact. Muscle strength 5/5 in all extremities. Sensation intact. Gait not checked.  PSYCHIATRIC: The patient is alert and oriented x 3.  SKIN: No obvious rash, lesion, or ulcer.   Data Review     CBC w Diff: Lab Results  Component Value Date   WBC 6.7 12/30/2015   HGB 8.1 (L) 12/30/2015   HGB 16.1 10/23/2013   HCT 23.7 (L) 12/30/2015   HCT 48.9 10/23/2013   PLT 106 (L) 12/30/2015   PLT 155 10/23/2013   LYMPHOPCT 36 05/30/2008   MONOPCT 12 05/30/2008   EOSPCT 1 05/30/2008   BASOPCT 1 05/30/2008   CMP: Lab Results  Component Value Date   NA 131 (L) 12/30/2015   NA 134 (A) 07/15/2015   NA 138 10/23/2013   K 3.6 12/30/2015   K 3.6 10/23/2013   CL 104 12/30/2015   CL 104 10/23/2013   CO2 24 12/30/2015   CO2 24 10/23/2013   BUN 20 12/30/2015   BUN 21 07/15/2015   BUN 8 10/23/2013   CREATININE 1.32 (H) 12/30/2015   CREATININE 0.95 10/23/2013   GLU 139 07/15/2015   PROT 5.4 (L) 12/27/2015   PROT 8.5 (H) 10/23/2013   ALBUMIN 2.2 (L) 12/29/2015   ALBUMIN 3.7 10/23/2013   BILITOT 2.5 (H) 12/27/2015   BILITOT 1.8 (H) 10/23/2013   ALKPHOS 57 12/27/2015   ALKPHOS 82 10/23/2013   AST 51 (H) 12/27/2015   AST 73 (H) 10/23/2013   ALT 19 12/27/2015   ALT 41 10/23/2013  .  Micro Results Recent Results (from the past 240 hour(s))  MRSA PCR Screening     Status: None   Collection Time: 12/27/15  3:27 PM  Result Value Ref Range Status   MRSA by PCR NEGATIVE NEGATIVE Final    Comment:        The GeneXpert MRSA Assay (FDA approved for NASAL specimens only), is one component of a comprehensive MRSA colonization surveillance program. It is not intended to diagnose MRSA infection nor to guide or monitor treatment for MRSA infections.         Code Status Orders        Start     Ordered   12/27/15 1538  Full code  Continuous     12/27/15 1537    Code Status History    Date Active Date Inactive Code Status Order ID Comments User Context   05/19/2015  7:43 PM  05/20/2015  6:19 PM Full Code [id number]  Houston Siren, MD Inpatient           Contact information for follow-up providers    BABAOFF, Lavada Mesi, MD. Go on 01/13/2016.   Specialty:  Family Medicine Why:  Tuesday at 2:30pm for hospital follow-up Contact information: [address] [facility] - Family [facility] Internal Medicine [place] [address] [phone]        Lynnae Prude, MD. Go on 02/05/2016.   Specialty:  Gastroenterology Why:  Thursday at 2:30pm for hospital follow-up Contact information: [address] [facility] - GASTROENTEROLOGY [place] [address] [phone]            Contact information for after-discharge care    Destination    [facility] SNF.   Specialty:  Skilled Nursing Facility Contact information: [address] [place] [address] [phone]                  Discharge Medications     Medication List    STOP taking these medications   ferrous sulfate 325 (65 FE) MG tablet   loperamide 2 MG tablet Commonly known as:  IMODIUM A-D     TAKE these medications   atorvastatin 10 MG tablet Commonly known as:  LIPITOR Take 1 tablet (10 mg total) by mouth daily.   bismuth subsalicylate 262 MG/15ML suspension Commonly known as:  PEPTO BISMOL Take 30 mLs by mouth every 6 (six) hours as needed.   furosemide 40 MG tablet Commonly known as:  LASIX Take 1 tablet (40 mg total) by mouth daily.   multivitamin with minerals Tabs tablet Take 1 tablet by mouth daily.   ondansetron 4 MG tablet Commonly known as:  ZOFRAN Take 1 tablet (4 mg total) by mouth every 8 (eight) hours as needed for nausea or vomiting.   pantoprazole 40 MG tablet Commonly known as:  PROTONIX Take 1 tablet (40 mg total) by mouth 2 (two) times daily.   spironolactone 25 MG tablet Commonly known as:  ALDACTONE Take 1 tablet (25 mg total) by mouth daily.          Total Time in preparing paper  work, data evaluation and todays exam - 35 minutes  Auburn Bilberry M.D on 12/30/2015 at 1:37 PM  [facility]   Office  [phone]

## 2015-12-30 NOTE — Progress Notes (Signed)
Pt feeling less well today with blood in stool. Pt spoke of family traditions at Thanksgiving. CH offered prayer.   12/30/15 1055  Clinical Encounter Type  Visited With Patient  Visit Type Follow-up  Referral From Chaplain  Spiritual Encounters  Spiritual Needs Prayer;Emotional  Stress Factors  Patient Stress Factors Health changes

## 2015-12-30 NOTE — Care Management Important Message (Signed)
Important Message  Patient Details  Name: Lawrence West MRN: [id number] Date of Birth: 09/25/1945   Medicare Important Message Given:  Yes    Chapman FitchBOWEN, Raedyn Klinck T, RN 12/30/2015, 3:01 PM

## 2015-12-30 NOTE — Clinical Social Work Placement (Signed)
   CLINICAL SOCIAL WORK PLACEMENT  NOTE  Date:  12/30/2015  Patient Details  Name: Lawrence West MRN: [id number] Date of Birth: 01/11/1946  Clinical Social Work is seeking post-discharge placement for this patient at the Skilled  Nursing Facility level of care (*CSW will initial, date and re-position this form in  chart as items are completed):  Yes   Patient/family provided with [place] Clinical Social Work Department's list of facilities offering this level of care within the geographic area requested by the patient (or if unable, by the patient's family).  Yes   Patient/family informed of their freedom to choose among providers that offer the needed level of care, that participate in Medicare, Medicaid or managed care program needed by the patient, have an available bed and are willing to accept the patient.  Yes   Patient/family informed of [facility]'s ownership interest in [facility] and [facility], as well as of the fact that they are under no obligation to receive care at these facilities.  PASRR submitted to EDS on 12/30/15     PASRR number received on 12/30/15     Existing PASRR number confirmed on       FL2 transmitted to all facilities in geographic area requested by pt/family on 12/30/15     FL2 transmitted to all facilities within larger geographic area on       Patient informed that his/her managed care company has contracts with or will negotiate with certain facilities, including the following:        Yes   Patient/family informed of bed offers received.  Patient chooses bed at  [facility])     Physician recommends and patient chooses bed at  [facility](SNF)    Patient to be transferred to  [facility]) on 12/30/15.  Patient to be transferred to facility by  (friend)     Patient family notified on 12/30/15 of transfer.  Name of family member notified:   (patient is alert and oriented X4)     PHYSICIAN       Additional Comment:     _______________________________________________ York SpanielMonica Courtland Coppa, [name] 12/30/2015, 2:01 PM

## 2015-12-30 NOTE — Discharge Instructions (Signed)
[facility] [place] at [facility]  DIET:  Cardiac diet  DISCHARGE CONDITION:  Stable  ACTIVITY:  Activity as tolerated  OXYGEN:  Home Oxygen: No.   Oxygen Delivery: room air  DISCHARGE LOCATION:  nursing home    ADDITIONAL DISCHARGE INSTRUCTION:check cbc in 4 days to f/u on hemoglobin   If you experience worsening of your admission symptoms, develop shortness of breath, life threatening emergency, suicidal or homicidal thoughts you must seek medical attention immediately by calling 911 or calling your MD immediately  if symptoms less severe.  You Must read complete instructions/literature along with all the possible adverse reactions/side effects for all the Medicines you take and that have been prescribed to you. Take any new Medicines after you have completely understood and accpet all the possible adverse reactions/side effects.   Please note  You were cared for by a hospitalist during your hospital stay. If you have any questions about your discharge medications or the care you received while you were in the hospital after you are discharged, you can call the unit and asked to speak with the hospitalist on call if the hospitalist that took care of you is not available. Once you are discharged, your primary care physician will handle any further medical issues. Please note that NO REFILLS for any discharge medications will be authorized once you are discharged, as it is imperative that you return to your primary care physician (or establish a relationship with a primary care physician if you do not have one) for your aftercare needs so that they can reassess your need for medications and monitor your lab values.

## 2015-12-30 NOTE — Evaluation (Signed)
Physical Therapy Evaluation Patient Details Name: Lawrence West MRN: [id number] DOB: 03/15/1945 Today's Date: 12/30/2015   History of Present Illness  Pt is a 70 y.o. male presenting to hospital with dark stool x5-6 days.  Pt admitted with melena with upper GI bleed and acute renal failure.  Pt s/p 2 units PRBC's transfusion and also s/p EGD 12/27/15.  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  Clinical Impression  Prior to hospital admission, pt was modified independent with functional mobility.  Pt lives alone on main level of home with ramp to enter.  Currently pt is min assist supine to sit; min assist to stand; and min assist to ambulate a few feet bed to chair with RW.  Pt with a couple loss of balance ambulating bed to chair requiring assist to steady/to regain balance.  Pt then c/o dizziness with standing and nausea limiting session's activities (nursing notified).  Pt would benefit from skilled PT to address noted impairments and functional limitations.  D/t current limitations with functional mobility, recommend pt discharge to STR when medically appropriate.    Follow Up Recommendations SNF    Equipment Recommendations  Rolling walker with 5" wheels    Recommendations for Other Services       Precautions / Restrictions Precautions Precautions: Fall;Posterior Hip Precaution Comments: L THA posterior approach 07/08/15 Restrictions Weight Bearing Restrictions: No      Mobility  Bed Mobility Overal bed mobility: Needs Assistance Bed Mobility: Supine to Sit     Supine to sit: Min assist     General bed mobility comments: assist for trunk; increased effort and time to perform  Transfers Overall transfer level: Needs assistance Equipment used: Rolling walker (2 wheeled) Transfers: Sit to/from Stand Sit to Stand: Min assist         General transfer comment: pt required vc's for hand  placement   Ambulation/Gait Ambulation/Gait assistance: Min assist Ambulation Distance (Feet): 3 Feet (bed to chair) Assistive device: Rolling walker (2 wheeled)   Gait velocity: decreased   General Gait Details: decreased B step length/foot clearance/heelstrike; pt unsteady with ambulation with a couple loss of balance requiring assist to steady  Stairs            Wheelchair Mobility    Modified Rankin (Stroke Patients Only)       Balance Overall balance assessment: Needs assistance Sitting-balance support: Bilateral upper extremity supported;Feet supported Sitting balance-Leahy Scale: Fair     Standing balance support: Bilateral upper extremity supported;During functional activity (UE support on RW) Standing balance-Leahy Scale: Poor Standing balance comment: a couple loss of balance with ambulation requiring assist to steady/regain balance                             Pertinent Vitals/Pain Pain Assessment: 0-10 Pain Score: 2  Pain Location: abdominal pain Pain Descriptors / Indicators: Aching Pain Intervention(s): Limited activity within patient's tolerance;Monitored during session;Repositioned  Vitals (HR and O2) stable and WFL throughout treatment session on room air.    Home Living Family/patient expects to be discharged to:: Private residence Living Arrangements: Alone   Type of Home: House Home Access: Ramped entrance     Home Layout: Multi-level;Able to live on main level with bedroom/bathroom Home Equipment: Dan HumphreysWalker - 2 wheels;Cane - single point (high toilets)      Prior Function Level of Independence: Independent with assistive device(s)  Comments: Pt using SPC in home; doesn't use any AD in community but will use motorized carts at stores.     Hand Dominance        Extremity/Trunk Assessment   Upper Extremity Assessment: Generalized weakness           Lower Extremity Assessment: Generalized weakness          Communication   Communication: No difficulties  Cognition Arousal/Alertness: Awake/alert Behavior During Therapy: WFL for tasks assessed/performed Overall Cognitive Status: Within Functional Limits for tasks assessed                      General Comments General comments (skin integrity, edema, etc.): Pt laying in bed upon PT arrival and reporting that he was feeling week and that his legs almost gave out when up with nursing aide recently.  Nursing cleared pt for participation in physical therapy.  Pt agreeable to PT session.    Exercises Total Joint Exercises Ankle Circles/Pumps: AROM;Strengthening;Both;10 reps;Supine Quad Sets: AROM;Strengthening;Both;10 reps;Supine Short Arc Quad: AROM;Strengthening;Both;10 reps;Supine Heel Slides: AROM;Strengthening;Both;10 reps;Supine Hip ABduction/ADduction: AROM;Strengthening;Both;10 reps;Supine  Increased effort noted to perform some ex's.   Assessment/Plan    PT Assessment Patient needs continued PT services  PT Problem List Decreased strength;Decreased activity tolerance;Decreased balance;Decreased mobility          PT Treatment Interventions DME instruction;Gait training;Functional mobility training;Therapeutic activities;Therapeutic exercise;Balance training;Patient/family education    PT Goals (Current goals can be found in the Care Plan section)  Acute Rehab PT Goals Patient Stated Goal: to go to rehab PT Goal Formulation: With patient Time For Goal Achievement: 01/13/16 Potential to Achieve Goals: Fair    Frequency Min 2X/week   Barriers to discharge Decreased caregiver support      Co-evaluation               End of Session Equipment Utilized During Treatment: Gait belt Activity Tolerance: Patient limited by fatigue Patient left: in chair;with call bell/phone within reach;with chair alarm set Nurse Communication: Mobility status;Precautions         Time: 1000-1030 PT Time Calculation (min) (ACUTE  ONLY): 30 min   Charges:   PT Evaluation $PT Eval Low Complexity: 1 Procedure PT Treatments $Therapeutic Exercise: 8-22 mins   PT G CodesHendricks West:        Lawrence West 12/30/2015, 10:55 AM Lawrence LimesEmily Elisa West, PT [phone]

## 2015-12-30 NOTE — [place] FL2 (Signed)
   MEDICAID FL2 LEVEL OF CARE SCREENING TOOL     IDENTIFICATION  Patient Name: Lawrence West Birthdate: 08/03/1945 Sex: male Admission Date (Current Location): 12/27/2015  [place]   Facility and Address:  [facility], [address], [place], [place] [address]      Provider Number: [id number]  Attending Physician Name and Address:  Auburn BilberryShreyang Patel, MD  Relative Name and Phone Number:       Current Level of Care: Hospital Recommended Level of Care: Skilled Nursing Facility Prior Approval Number:    Date Approved/Denied:   PASRR Number: [id number]  Discharge Plan: SNF    Current Diagnoses: Patient Active Problem List   Diagnosis Date Noted  . GIB (gastrointestinal bleeding) 12/27/2015  . Hyperlipidemia 07/23/2015  . Depression 07/23/2015  . Avascular necrosis of bone of left hip (HCC) 07/08/2015  . S/P total hip arthroplasty 07/08/2015  . Ascites due to alcoholic cirrhosis (HCC) 05/19/2015    Orientation RESPIRATION BLADDER Height & Weight     Self, Time, Situation, Place  Normal Continent Weight: 224 lb 6.9 oz (101.8 kg) Height:  5\' 11"  (180.3 cm)  BEHAVIORAL SYMPTOMS/MOOD NEUROLOGICAL BOWEL NUTRITION STATUS   (none)  (none) Continent Diet (heart healthy)  AMBULATORY STATUS COMMUNICATION OF NEEDS Skin   Limited Assist Verbally Normal                       Personal Care Assistance Level of Assistance  Bathing, Dressing Bathing Assistance: Limited assistance   Dressing Assistance: Limited assistance     Functional Limitations Info             SPECIAL CARE FACTORS FREQUENCY  PT (By licensed PT)                    Contractures Contractures Info: Not present    Additional Factors Info  Code Status, Allergies Code Status Info: full Allergies Info: nka           Current Medications (12/30/2015):  This is the current hospital active medication list Current  Facility-Administered Medications  Medication Dose Route Frequency Provider Last Rate Last Dose  . 0.9 %  sodium chloride infusion   Intravenous Once Charlott RakesVincent Schooler, MD   Stopped at 12/29/15 0151  . oxyCODONE (Oxy IR/ROXICODONE) immediate release tablet 5 mg  5 mg Oral Q6H PRN Auburn BilberryShreyang Patel, MD      . pantoprazole (PROTONIX) EC tablet 40 mg  40 mg Oral BID Auburn BilberryShreyang Patel, MD   40 mg at 12/30/15 1044  . sodium chloride flush (NS) 0.9 % injection 3 mL  3 mL Intravenous Q12H Adrian SaranSital Mody, MD   3 mL at 12/30/15 1050     Discharge Medications: Please see discharge summary for a list of discharge medications.  Relevant Imaging Results:  Relevant Lab Results:   Additional Information ss: [id number]  York SpanielMonica Werner Labella, [name]

## 2015-12-31 ENCOUNTER — Emergency Department
Admission: EM | Admit: 2015-12-31 | Discharge: 2015-12-31 | Disposition: A | Discharge status: Home / Self Care | Payer: Commercial Managed Care - HMO | Attending: Emergency Medicine | Admitting: Emergency Medicine

## 2015-12-31 ENCOUNTER — Encounter: Payer: Self-pay | Admitting: Emergency Medicine

## 2015-12-31 ENCOUNTER — Emergency Department: Payer: Commercial Managed Care - HMO

## 2015-12-31 DIAGNOSIS — R140 Abdominal distension (gaseous): Secondary | ICD-10-CM | POA: Diagnosis not present

## 2015-12-31 DIAGNOSIS — I10 Essential (primary) hypertension: Secondary | ICD-10-CM | POA: Diagnosis not present

## 2015-12-31 DIAGNOSIS — D649 Anemia, unspecified: Secondary | ICD-10-CM | POA: Diagnosis not present

## 2015-12-31 DIAGNOSIS — K7031 Alcoholic cirrhosis of liver with ascites: Secondary | ICD-10-CM | POA: Diagnosis not present

## 2015-12-31 DIAGNOSIS — K922 Gastrointestinal hemorrhage, unspecified: Secondary | ICD-10-CM | POA: Diagnosis not present

## 2015-12-31 DIAGNOSIS — R188 Other ascites: Secondary | ICD-10-CM | POA: Diagnosis not present

## 2015-12-31 DIAGNOSIS — I129 Hypertensive chronic kidney disease with stage 1 through stage 4 chronic kidney disease, or unspecified chronic kidney disease: Secondary | ICD-10-CM | POA: Diagnosis not present

## 2015-12-31 DIAGNOSIS — G4733 Obstructive sleep apnea (adult) (pediatric): Secondary | ICD-10-CM | POA: Diagnosis not present

## 2015-12-31 DIAGNOSIS — K7460 Unspecified cirrhosis of liver: Secondary | ICD-10-CM | POA: Diagnosis not present

## 2015-12-31 DIAGNOSIS — N189 Chronic kidney disease, unspecified: Secondary | ICD-10-CM | POA: Diagnosis not present

## 2015-12-31 DIAGNOSIS — E785 Hyperlipidemia, unspecified: Secondary | ICD-10-CM | POA: Diagnosis not present

## 2015-12-31 DIAGNOSIS — Z79899 Other long term (current) drug therapy: Secondary | ICD-10-CM | POA: Diagnosis not present

## 2015-12-31 DIAGNOSIS — M109 Gout, unspecified: Secondary | ICD-10-CM | POA: Diagnosis not present

## 2015-12-31 LAB — COMPREHENSIVE METABOLIC PANEL
ALK PHOS: 87 U/L (ref 38–126)
ALT: 23 U/L (ref 17–63)
AST: 66 U/L — AB (ref 15–41)
Albumin: 2.3 g/dL — ABNORMAL LOW (ref 3.5–5.0)
Anion gap: 8 (ref 5–15)
BUN: 13 mg/dL (ref 6–20)
CO2: 21 mmol/L — ABNORMAL LOW (ref 22–32)
Calcium: 8 mg/dL — ABNORMAL LOW (ref 8.9–10.3)
Chloride: 104 mmol/L (ref 101–111)
Creatinine, Ser: 1.23 mg/dL (ref 0.61–1.24)
GFR calc Af Amer: >60 mL/min (ref >60)
GFR, EST NON AFRICAN AMERICAN: 58 mL/min — AB (ref >60)
GLUCOSE: 126 mg/dL — AB (ref 65–99)
Potassium: 3.7 mmol/L (ref 3.5–5.1)
SODIUM: 133 mmol/L — AB (ref 135–145)
TOTAL PROTEIN: 5.6 g/dL — AB (ref 6.5–8.1)
Total Bilirubin: 2 mg/dL — ABNORMAL HIGH (ref 0.3–1.2)

## 2015-12-31 LAB — URINALYSIS COMPLETE WITH MICROSCOPIC (ARMC ONLY)
Bacteria, UA: NONE SEEN
Bilirubin Urine: NEGATIVE
Glucose, UA: NEGATIVE mg/dL
HGB URINE DIPSTICK: NEGATIVE
KETONES UR: NEGATIVE mg/dL
LEUKOCYTES UA: NEGATIVE
NITRITE: NEGATIVE
PH: 7 (ref 5.0–8.0)
PROTEIN: NEGATIVE mg/dL
SPECIFIC GRAVITY, URINE: 1.005 (ref 1.005–1.030)
Squamous Epithelial / LPF: NONE SEEN
WBC UA: NONE SEEN WBC/hpf (ref 0–5)

## 2015-12-31 LAB — CBC
HEMATOCRIT: 27.7 % — AB (ref 40.0–52.0)
HEMOGLOBIN: 9.5 g/dL — AB (ref 13.0–18.0)
MCH: 33.4 pg (ref 26.0–34.0)
MCHC: 34.3 g/dL (ref 32.0–36.0)
MCV: 97.5 fL (ref 80.0–100.0)
Platelets: 138 10*3/uL — ABNORMAL LOW (ref 150–440)
RBC: 2.84 MIL/uL — AB (ref 4.40–5.90)
RDW: 20.6 % — ABNORMAL HIGH (ref 11.5–14.5)
WBC: 9.6 10*3/uL (ref 3.8–10.6)

## 2015-12-31 LAB — TYPE AND SCREEN
ABO/RH(D): A POS
ANTIBODY SCREEN: NEGATIVE

## 2015-12-31 LAB — PROTIME-INR
INR: 1.31
PROTHROMBIN TIME: 16.4 s — AB (ref 11.4–15.2)

## 2015-12-31 MED ORDER — TRAMADOL HCL 50 MG PO TABS: 50.0000 mg | ORAL_TABLET | Freq: Four times a day (QID) | ORAL | 0 refills | Status: DC | PRN

## 2015-12-31 MED ORDER — TRAMADOL HCL 50 MG PO TABS
50.0000 mg | ORAL_TABLET | Freq: Once | ORAL | Status: AC
  Administered 2015-12-31: 50 mg via ORAL

## 2015-12-31 MED ORDER — TRAMADOL HCL 50 MG PO TABS
ORAL_TABLET | ORAL | Status: AC
  Administered 2015-12-31: 50 mg via ORAL
  Filled 2015-12-31: qty 1

## 2015-12-31 MED ORDER — ALBUMIN HUMAN 25 % IV SOLN
50.0000 g | Freq: Once | INTRAVENOUS | Status: AC
  Administered 2015-12-31: 50 g via INTRAVENOUS
  Filled 2015-12-31: qty 200

## 2015-12-31 NOTE — ED Notes (Signed)
2nd bottle of albumin started.

## 2015-12-31 NOTE — ED Notes (Signed)
Pt updated that albumin IV runs at rate of 321mL/min and 200mL infusion will take approximately 3 hours and 20 mins. Pt is anxious to leave but cooperative with plan. Pt's friends at bedside are going to pt's house to retrieve his wallet and keys so he can get a cab home once discharged.

## 2015-12-31 NOTE — ED Triage Notes (Signed)
Pt just admitted for GI bleed. Went home yesterday. Had over 5 L taken off yesterday and now reports more swollen than before that. Having blood in stools still. Was having hematemesis when here but has not vomited blood since home. Had multiple blood transfusions when here. Cannot go to his rehab until this gets fixed. Has been weak and dizzy.

## 2015-12-31 NOTE — Discharge Instructions (Signed)
Please seek medical attention for any high fevers, chest pain, shortness of breath, change in behavior, persistent vomiting, bloody stool or any other new or concerning symptoms.  

## 2015-12-31 NOTE — Procedures (Signed)
Successful US-guided paracentesis.  Removed 4.5 liters of yellow fluid.  Minimal blood loss.  No immediate complication.

## 2015-12-31 NOTE — ED Notes (Signed)
Patient transported to Ultrasound 

## 2015-12-31 NOTE — ED Notes (Signed)
Reports being dx from here yesterday, had GI bleeding and had 5L pulled off peritoneal . States he felt so much better but today the swelling is back. No resp distress.

## 2015-12-31 NOTE — ED Provider Notes (Signed)
[facility] Emergency Department Provider Note  ____________________________________________  Time seen: Approximately 1:26 PM  I have reviewed the triage vital signs and the nursing notes.   HISTORY  Chief Complaint Hematemesis and Blood In Stools   HPI Lawrence West is a 70 y.o. male history of alcoholic cirrhosis complicated by ascites who presents for evaluation of abdominal distention. Patient was discharged from the hospital yesterday after being admitted for 3 days in the setting of an upper GI bleed. Patient underwent an endoscopy was found to have multiple ulcers none actively bleeding in the setting of heavy NSAID use. NO evidence of varices. Patient received blood transfusions and underwent a paracentesis with 5.25 L of fluid removed 2 days ago. Patient was discharged yesterday to rehabilitation. This morning patient was noted to have worsening abdominal distention and patient and NP at the rehab were concerned and patient was sent here for evaluation. Patient reports that he has been having occasional red bloody stools although BM this morning was brown with no blood. He is no longer having melena, no hematemesis. He denies abdominal pain, nausea, vomiting, fever, CP. He does endorse dizziness mostly when he stands up and DOE.   Past Medical History:  Diagnosis Date  . Arthritis    "left knee" (07/08/2015)  . Ascites   . Avascular necrosis of bone of left hip (HCC) 07/08/2015  . Brown recluse spider bite 1990s   "they thought I was gonna die then"  . Cirrhosis of liver (HCC)    Sees Dr. Sung Amabileari Richards  . Depression   . Edema of both legs    Takes Lasix  . Gout X 1  . High cholesterol   . Hypertension   . Narcolepsy   . Rheumatic fever   . Sleep apnea    pt stated "I have sleep apnea but I do not use the machine...Marland Kitchen.Marland Kitchen.Marland Kitchen.it broke" (07/08/2015)    Patient Active Problem List   Diagnosis Date Noted  . GIB (gastrointestinal bleeding) 12/27/2015    . Hyperlipidemia 07/23/2015  . Depression 07/23/2015  . Avascular necrosis of bone of left hip (HCC) 07/08/2015  . S/P total hip arthroplasty 07/08/2015  . Ascites due to alcoholic cirrhosis (HCC) 05/19/2015    Past Surgical History:  Procedure Laterality Date  . COSMETIC SURGERY  ~ 1997   "nose done, lip drop, chin lift"  . ESOPHAGOGASTRODUODENOSCOPY N/A 12/27/2015   Procedure: ESOPHAGOGASTRODUODENOSCOPY (EGD);  Surgeon: Charlott RakesVincent Schooler, MD;  [facility] ENDOSCOPY;  Service: Endoscopy;  Laterality: N/A;  . FOOT FRACTURE SURGERY Right   . FRACTURE SURGERY    . INGUINAL HERNIA REPAIR Bilateral 1990s  . TONSILLECTOMY  1954  . TOTAL HIP ARTHROPLASTY Left 07/08/2015  . TOTAL HIP ARTHROPLASTY Left 07/08/2015   Procedure: LEFT TOTAL HIP ARTHROPLASTY;  Surgeon: Teryl LucyJoshua Landau, MD;  Location: [facility] OR;  Service: Orthopedics;  Laterality: Left;  . TOTAL KNEE ARTHROPLASTY Left 03/2008  . US GUIDED PARACENTESIS [facility] HX)  April 2017    Prior to Admission medications   Medication Sig Start Date End Date Taking? Authorizing Provider  losartan (COZAAR) 50 MG tablet Take 1 tablet by mouth daily. 09/02/15 09/01/16 Yes Historical Provider, MD  sildenafil (REVATIO) 20 MG tablet Take 3 tablets by mouth daily as needed. 12/03/15  Yes Historical Provider, MD  atorvastatin (LIPITOR) 10 MG tablet Take 1 tablet (10 mg total) by mouth daily. 05/20/15   Enid Baasadhika Kalisetti, MD  bismuth subsalicylate (PEPTO BISMOL) 262 MG/15ML suspension Take 30 mLs by mouth  every 6 (six) hours as needed.    Historical Provider, MD  furosemide (LASIX) 40 MG tablet Take 1 tablet (40 mg total) by mouth daily. 05/20/15   Enid Baas, MD  Multiple Vitamin (MULTIVITAMIN WITH MINERALS) TABS tablet Take 1 tablet by mouth daily.    Historical Provider, MD  ondansetron (ZOFRAN) 4 MG tablet Take 1 tablet (4 mg total) by mouth every 8 (eight) hours as needed for nausea or vomiting. 07/08/15   Teryl Lucy, MD  pantoprazole (PROTONIX) 40  MG tablet Take 1 tablet (40 mg total) by mouth 2 (two) times daily. 12/30/15   Auburn Bilberry, MD  spironolactone (ALDACTONE) 25 MG tablet Take 1 tablet (25 mg total) by mouth daily. 12/30/15   Auburn Bilberry, MD    Allergies Patient has no known allergies.  Family History  Problem Relation Age of Onset  . Melanoma Father   . Lung cancer Father     Social History Social History  Substance Use Topics  . Smoking status: Never Smoker  . Smokeless tobacco: Never Used  . Alcohol use 0.0 oz/week     Comment: 07/08/2015 former Heavy ETOH abuse.; "haven't had a drink in 2017; AA told me I didn't need to come back"    Review of Systems  Constitutional: Negative for fever.  Eyes: Negative for visual changes. ENT: Negative for sore throat. Neck: No neck pain  Cardiovascular: Negative for chest pain. Respiratory: Negative for shortness of breath.  Gastrointestinal: Negative for abdominal pain, vomiting or diarrhea. + abdominal distention Genitourinary: Negative for dysuria. Musculoskeletal: Negative for back pain. Skin: Negative for rash. Neurological: Negative for headaches, weakness or numbness. Psych: No SI or HI  ____________________________________________   PHYSICAL EXAM:  VITAL SIGNS: ED Triage Vitals  Enc Vitals Group     BP 12/31/15 1153 126/67     Pulse Rate 12/31/15 1153 85     Resp 12/31/15 1153 20     Temp 12/31/15 1153 98.5 F (36.9 C)     Temp Source 12/31/15 1153 Oral     SpO2 12/31/15 1153 97 %     Weight 12/31/15 1153 219 lb (99.3 kg)     Height 12/31/15 1153 5\' 11"  (1.803 m)     Head Circumference --      Peak Flow --      Pain Score 12/31/15 1154 4     Pain Loc --      Pain Edu? --      Excl. in GC? --     Constitutional: Alert and oriented. Well appearing and in no apparent distress. HEENT:      Head: Normocephalic and atraumatic.         Eyes: Conjunctivae are normal. Sclera is non-icteric. EOMI. PERRL      Mouth/Throat: Mucous membranes are  moist.       Neck: Supple with no signs of meningismus. Cardiovascular: Regular rate and rhythm. No murmurs, gallops, or rubs. 2+ symmetrical distal pulses are present in all extremities. No JVD. Respiratory: Normal respiratory effort. Lungs are clear to auscultation bilaterally. No wheezes, crackles, or rhonchi.  Gastrointestinal: Distended, non tender with positive bowel sounds. No rebound or guarding. Genitourinary: No CVA tenderness. Rectal exam showing dark red stool hemoccult positive, no melena Musculoskeletal: Nontender with normal range of motion in all extremities. No edema, cyanosis, or erythema of extremities. Neurologic: Normal speech and language. Face is symmetric. Moving all extremities. No gross focal neurologic deficits are appreciated. Skin: Skin is warm, dry and intact. No  rash noted. Psychiatric: Mood and affect are normal. Speech and behavior are normal.  ____________________________________________   LABS (all labs ordered are listed, but only abnormal results are displayed)  Labs Reviewed  URINALYSIS COMPLETEWITH MICROSCOPIC ([facility] ONLY) - Abnormal; Notable for the following:       Result Value   Color, Urine STRAW (*)    APPearance CLEAR (*)    All other components within normal limits  COMPREHENSIVE METABOLIC PANEL - Abnormal; Notable for the following:    Sodium 133 (*)    CO2 21 (*)    Glucose, Bld 126 (*)    Calcium 8.0 (*)    Total Protein 5.6 (*)    Albumin 2.3 (*)    AST 66 (*)    Total Bilirubin 2.0 (*)    GFR calc non Af Amer 58 (*)    All other components within normal limits  CBC - Abnormal; Notable for the following:    RBC 2.84 (*)    Hemoglobin 9.5 (*)    HCT 27.7 (*)    RDW 20.6 (*)    Platelets 138 (*)    All other components within normal limits  PROTIME-INR - Abnormal; Notable for the following:    Prothrombin Time 16.4 (*)    All other components within normal limits  TYPE AND SCREEN    ____________________________________________  EKG  ED ECG REPORT I, Nita Sicklearolina Havah Ammon, the attending physician, personally viewed and interpreted this ECG.  Sinus rhythm, rate of 98, first-degree AV block, normal QRS and QTc intervals, left axis deviation, low voltage QRS, no ST elevations or depressions. Low voltage is new when compared to EKG from 06/2015 ____________________________________________  RADIOLOGY  CXR: Low lung volumes with no evidence of lobar pneumonia. Aortic atherosclerosis. ____________________________________________   PROCEDURES  Procedure(s) performed:yes Procedures   Bedside US showing large amount of ascites.    Critical Care performed:  Yes  CRITICAL CARE Performed by: Nita Sicklearolina Cordney Barstow  ?  Total critical care time: 35 min  Critical care time was exclusive of separately billable procedures and treating other patients.  Critical care was necessary to treat or prevent imminent or life-threatening deterioration.  Critical care was time spent personally by me on the following activities: development of treatment plan with patient and/or surrogate as well as nursing, discussions with consultants, evaluation of patient's response to treatment, examination of patient, obtaining history from patient or surrogate, ordering and performing treatments and interventions, ordering and review of laboratory studies, ordering and review of radiographic studies, pulse oximetry and re-evaluation of patient's condition.  ____________________________________________   INITIAL IMPRESSION / ASSESSMENT AND PLAN / ED COURSE  70 y.o. male history of alcoholic cirrhosis complicated by ascites who presents for evaluation of abdominal distention. Patient had 5.25L removed via paracentesis 2 days ago. He has large amount of ascites on bedside US. Rectal exam showing no melena. hgb and creatinine improving. VS stable with no evidence of orthostasis. Patient has no oxygen  requirement. Will discuss with VIR for possible paracentesis.    EKG with no new low voltage, bedside ECHO with no pericardial fluid.   Clinical Course as of Dec 31 1511  Wed Dec 31, 2015  1428 No indication for admission at this time. Discussed with VIR who will perform large volume paracentesis. Discuss with pharmacy who recommended 50g of 25% albumin after paracentesis.   [CV]  1511 Patient just left for paracentesis. Albumin ordered for when patient returns. Care transferred to Dr. Derrill KayGoodman.  [CV]    Clinical  Course User Index [CV] Nita Sickle, MD    Pertinent labs & imaging results that were available during my care of the patient were reviewed by me and considered in my medical decision making (see chart for details).    ____________________________________________   FINAL CLINICAL IMPRESSION(S) / ED DIAGNOSES  Final diagnoses:  Abdominal distention  Alcoholic cirrhosis of liver with ascites (HCC)      NEW MEDICATIONS STARTED DURING THIS VISIT:  New Prescriptions   No medications on file     Note:  This document was prepared using Dragon voice recognition software and may include unintentional dictation errors.    Nita Sickle, MD 12/31/15 [phone]

## 2016-01-02 DIAGNOSIS — G4733 Obstructive sleep apnea (adult) (pediatric): Secondary | ICD-10-CM | POA: Diagnosis not present

## 2016-01-02 DIAGNOSIS — F418 Other specified anxiety disorders: Secondary | ICD-10-CM | POA: Diagnosis not present

## 2016-01-02 DIAGNOSIS — K7030 Alcoholic cirrhosis of liver without ascites: Secondary | ICD-10-CM | POA: Diagnosis not present

## 2016-01-02 DIAGNOSIS — K254 Chronic or unspecified gastric ulcer with hemorrhage: Secondary | ICD-10-CM | POA: Diagnosis not present

## 2016-01-06 DIAGNOSIS — I129 Hypertensive chronic kidney disease with stage 1 through stage 4 chronic kidney disease, or unspecified chronic kidney disease: Secondary | ICD-10-CM | POA: Diagnosis not present

## 2016-01-06 DIAGNOSIS — M109 Gout, unspecified: Secondary | ICD-10-CM | POA: Diagnosis not present

## 2016-01-06 DIAGNOSIS — E785 Hyperlipidemia, unspecified: Secondary | ICD-10-CM | POA: Diagnosis not present

## 2016-01-06 DIAGNOSIS — D649 Anemia, unspecified: Secondary | ICD-10-CM | POA: Diagnosis not present

## 2016-01-06 DIAGNOSIS — N189 Chronic kidney disease, unspecified: Secondary | ICD-10-CM | POA: Diagnosis not present

## 2016-01-06 DIAGNOSIS — K922 Gastrointestinal hemorrhage, unspecified: Secondary | ICD-10-CM | POA: Diagnosis not present

## 2016-01-06 DIAGNOSIS — G4733 Obstructive sleep apnea (adult) (pediatric): Secondary | ICD-10-CM | POA: Diagnosis not present

## 2016-01-06 DIAGNOSIS — K7460 Unspecified cirrhosis of liver: Secondary | ICD-10-CM | POA: Diagnosis not present

## 2016-01-06 DIAGNOSIS — I10 Essential (primary) hypertension: Secondary | ICD-10-CM | POA: Diagnosis not present

## 2016-01-07 DIAGNOSIS — K7031 Alcoholic cirrhosis of liver with ascites: Secondary | ICD-10-CM | POA: Diagnosis not present

## 2016-01-08 ENCOUNTER — Other Ambulatory Visit: Payer: Self-pay | Admitting: Unknown Physician Specialty

## 2016-01-08 DIAGNOSIS — K7031 Alcoholic cirrhosis of liver with ascites: Secondary | ICD-10-CM

## 2016-01-12 ENCOUNTER — Ambulatory Visit
Admission: RE | Admit: 2016-01-12 | Discharge: 2016-01-12 | Disposition: A | Discharge status: Home / Self Care | Payer: Commercial Managed Care - HMO | Source: Ambulatory Visit | Attending: Unknown Physician Specialty | Admitting: Unknown Physician Specialty

## 2016-01-12 DIAGNOSIS — K7031 Alcoholic cirrhosis of liver with ascites: Secondary | ICD-10-CM | POA: Diagnosis not present

## 2016-01-12 DIAGNOSIS — R188 Other ascites: Secondary | ICD-10-CM | POA: Diagnosis not present

## 2016-01-12 LAB — AMYLASE, BODY FLUID: Amylase, Fluid: 7 U/L

## 2016-01-12 LAB — BODY FLUID CELL COUNT WITH DIFFERENTIAL
EOS FL: 0 %
Lymphs, Fluid: 49 %
MONOCYTE-MACROPHAGE-SEROUS FLUID: 49 %
NEUTROPHIL FLUID: 2 %
Other Cells, Fluid: 0 %
WBC FLUID: 185 uL

## 2016-01-12 MED ORDER — ALBUMIN HUMAN 25 % IV SOLN
25.0000 g | Freq: Once | INTRAVENOUS | Status: DC
  Filled 2016-01-12: qty 100

## 2016-01-13 ENCOUNTER — Other Ambulatory Visit: Payer: Self-pay | Admitting: Unknown Physician Specialty

## 2016-01-13 DIAGNOSIS — K7031 Alcoholic cirrhosis of liver with ascites: Secondary | ICD-10-CM

## 2016-01-13 DIAGNOSIS — D62 Acute posthemorrhagic anemia: Secondary | ICD-10-CM | POA: Diagnosis not present

## 2016-01-13 LAB — PATHOLOGIST SMEAR REVIEW

## 2016-01-15 ENCOUNTER — Other Ambulatory Visit: Payer: Self-pay | Admitting: Unknown Physician Specialty

## 2016-01-15 DIAGNOSIS — K7031 Alcoholic cirrhosis of liver with ascites: Secondary | ICD-10-CM

## 2016-01-15 LAB — LIPASE, FLUID: LIPASE-FLUID: 11 U/L

## 2016-01-20 NOTE — Discharge Instructions (Signed)
Paracentesis, Care After °Introduction °Refer to this sheet in the next few weeks. These instructions provide you with information about caring for yourself after your procedure. Your health care provider may also give you more specific instructions. Your treatment has been planned according to current medical practices, but problems sometimes occur. Call your health care provider if you have any problems or questions after your procedure. °What can I expect after the procedure? °After your procedure, it is common to have a small amount of clear fluid coming from the puncture site. °Follow these instructions at home: °· Return to your normal activities as told by your health care provider. Ask your health care provider what activities are safe for you. °· Take over-the-counter and prescription medicines only as told by your health care provider. °· Do not take baths, swim, or use a hot tub until your health care provider approves. °· Follow instructions from your health care provider about: °¨ How to take care of your puncture site. °¨ When and how you should change your bandage (dressing). °¨ When you should remove your dressing. °· Check your puncture area every day signs of infection. Watch for: °¨ Redness, swelling, or pain. °¨ Fluid, blood, or pus. °· Keep all follow-up visits as told by your health care provider. This is important. °Contact a health care provider if: °· You have redness, swelling, or pain at your puncture site. °· You start to have more clear fluid coming from your puncture site. °· You have blood or pus coming from your puncture site. °· You have chills. °· You have a fever. °Get help right away if: °· You develop chest pain or shortness of breath. °· You develop increasing pain, discomfort, or swelling in your abdomen. °· You feel dizzy or light-headed or you pass out. °This information is not intended to replace advice given to you by your health care provider. Make sure you discuss any  questions you have with your health care provider. °Document Released: 06/11/2014 Document Revised: 07/03/2015 Document Reviewed: 04/09/2014 °© 2017 [employer] ° °

## 2016-01-22 DIAGNOSIS — F430 Acute stress reaction: Secondary | ICD-10-CM | POA: Diagnosis not present

## 2016-01-26 ENCOUNTER — Ambulatory Visit
Admission: RE | Admit: 2016-01-26 | Discharge: 2016-01-26 | Disposition: A | Discharge status: Home / Self Care | Payer: Commercial Managed Care - HMO | Source: Ambulatory Visit | Attending: Unknown Physician Specialty | Admitting: Unknown Physician Specialty

## 2016-01-28 DIAGNOSIS — R52 Pain, unspecified: Secondary | ICD-10-CM | POA: Diagnosis not present

## 2016-01-28 DIAGNOSIS — R6883 Chills (without fever): Secondary | ICD-10-CM | POA: Diagnosis not present

## 2016-01-28 DIAGNOSIS — J101 Influenza due to other identified influenza virus with other respiratory manifestations: Secondary | ICD-10-CM | POA: Diagnosis not present

## 2016-02-06 ENCOUNTER — Ambulatory Visit
Admission: RE | Admit: 2016-02-06 | Discharge: 2016-02-06 | Disposition: A | Discharge status: Home / Self Care | Payer: Commercial Managed Care - HMO | Source: Ambulatory Visit | Attending: Unknown Physician Specialty | Admitting: Unknown Physician Specialty

## 2016-02-06 DIAGNOSIS — K7031 Alcoholic cirrhosis of liver with ascites: Secondary | ICD-10-CM | POA: Diagnosis not present

## 2016-02-06 DIAGNOSIS — R188 Other ascites: Secondary | ICD-10-CM | POA: Diagnosis not present

## 2016-02-06 MED ORDER — ALBUMIN HUMAN 25 % IV SOLN
25.0000 g | Freq: Once | INTRAVENOUS | Status: AC
  Administered 2016-02-06: 25 g via INTRAVENOUS
  Filled 2016-02-06: qty 100

## 2016-02-10 ENCOUNTER — Ambulatory Visit: Admission: RE | Admit: 2016-02-10 | Payer: Commercial Managed Care - HMO | Source: Ambulatory Visit

## 2016-02-23 ENCOUNTER — Ambulatory Visit
Admission: RE | Admit: 2016-02-23 | Discharge: 2016-02-23 | Disposition: A | Discharge status: Home / Self Care | Payer: Commercial Managed Care - HMO | Source: Ambulatory Visit | Attending: Unknown Physician Specialty | Admitting: Unknown Physician Specialty

## 2016-02-23 ENCOUNTER — Other Ambulatory Visit (HOSPITAL_COMMUNITY): Payer: Self-pay | Admitting: Interventional Radiology

## 2016-02-23 ENCOUNTER — Emergency Department
Admission: EM | Admit: 2016-02-23 | Discharge: 2016-02-23 | Disposition: A | Discharge status: Home / Self Care | Payer: Commercial Managed Care - HMO | Attending: Emergency Medicine | Admitting: Emergency Medicine

## 2016-02-23 ENCOUNTER — Encounter: Payer: Self-pay | Admitting: Emergency Medicine

## 2016-02-23 ENCOUNTER — Emergency Department: Payer: Commercial Managed Care - HMO

## 2016-02-23 DIAGNOSIS — K7031 Alcoholic cirrhosis of liver with ascites: Secondary | ICD-10-CM | POA: Insufficient documentation

## 2016-02-23 DIAGNOSIS — E7220 Disorder of urea cycle metabolism, unspecified: Secondary | ICD-10-CM | POA: Diagnosis not present

## 2016-02-23 DIAGNOSIS — R410 Disorientation, unspecified: Secondary | ICD-10-CM | POA: Insufficient documentation

## 2016-02-23 DIAGNOSIS — Z79899 Other long term (current) drug therapy: Secondary | ICD-10-CM | POA: Insufficient documentation

## 2016-02-23 DIAGNOSIS — R188 Other ascites: Secondary | ICD-10-CM

## 2016-02-23 DIAGNOSIS — Z0000 Encounter for general adult medical examination without abnormal findings: Secondary | ICD-10-CM | POA: Diagnosis present

## 2016-02-23 DIAGNOSIS — I10 Essential (primary) hypertension: Secondary | ICD-10-CM | POA: Diagnosis not present

## 2016-02-23 LAB — CBC
HEMATOCRIT: 31.3 % — AB (ref 40.0–52.0)
HEMOGLOBIN: 10.3 g/dL — AB (ref 13.0–18.0)
MCH: 28.7 pg (ref 26.0–34.0)
MCHC: 32.7 g/dL (ref 32.0–36.0)
MCV: 87.5 fL (ref 80.0–100.0)
Platelets: 101 10*3/uL — ABNORMAL LOW (ref 150–440)
RBC: 3.58 MIL/uL — AB (ref 4.40–5.90)
RDW: 19.8 % — ABNORMAL HIGH (ref 11.5–14.5)
WBC: 4.8 10*3/uL (ref 3.8–10.6)

## 2016-02-23 LAB — COMPREHENSIVE METABOLIC PANEL
ALBUMIN: 2.8 g/dL — AB (ref 3.5–5.0)
ALK PHOS: 69 U/L (ref 38–126)
ALT: 16 U/L — ABNORMAL LOW (ref 17–63)
ANION GAP: 7 (ref 5–15)
AST: 41 U/L (ref 15–41)
BUN: 11 mg/dL (ref 6–20)
CALCIUM: 8.4 mg/dL — AB (ref 8.9–10.3)
CHLORIDE: 97 mmol/L — AB (ref 101–111)
CO2: 29 mmol/L (ref 22–32)
Creatinine, Ser: 1.11 mg/dL (ref 0.61–1.24)
GFR calc Af Amer: >60 mL/min (ref >60)
GFR calc non Af Amer: >60 mL/min (ref >60)
GLUCOSE: 130 mg/dL — AB (ref 65–99)
Potassium: 3.2 mmol/L — ABNORMAL LOW (ref 3.5–5.1)
SODIUM: 133 mmol/L — AB (ref 135–145)
Total Bilirubin: 2.3 mg/dL — ABNORMAL HIGH (ref 0.3–1.2)
Total Protein: 6.3 g/dL — ABNORMAL LOW (ref 6.5–8.1)

## 2016-02-23 LAB — LIPASE, BLOOD: Lipase: 84 U/L — ABNORMAL HIGH (ref 11–51)

## 2016-02-23 LAB — AMMONIA: Ammonia: 72 umol/L — ABNORMAL HIGH (ref 9–35)

## 2016-02-23 MED ORDER — ALBUMIN HUMAN 25 % IV SOLN
25.0000 g | Freq: Once | INTRAVENOUS | Status: AC
Start: 2016-02-23 — End: 2016-02-23
  Administered 2016-02-23: 25 g via INTRAVENOUS
  Filled 2016-02-23: qty 100

## 2016-02-23 NOTE — ED Notes (Signed)
Per pt , PCP called at home and sent here for elevated amonia levels. Pt was seen at PCP for thorocentesis and removed 6L of fluid. Pt A&Ox4, per caregivers pt is slightly more confused than normal. Pt in NAD at this time

## 2016-02-23 NOTE — ED Triage Notes (Signed)
Pt reports had paracentesis today with 6 liters of fluid drawn off. Pt states Dr. Markham JordanElliot sent him here for ammonia level.

## 2016-02-23 NOTE — ED Provider Notes (Signed)
[facility] Emergency Department Provider Note  Time seen: 9:59 PM  I have reviewed the triage vital signs and the nursing notes.   HISTORY  Chief Complaint Sent for labs    HPI Lawrence West is a 71 y.o. male with a past medical history of alcoholic liver cirrhosis who presents to the emergency department for evaluation. According to the patient he had 6 L of fluid removed from his abdomen today by Dr. Mechele Collin. Patient's friends discuss some concerns of mild confusion, so the patient was sent to the emergency department for evaluation and lab evaluation. Here the patient is alert and oriented 4, no distress. Denies any complaints such as pain. Patient does admit he has had 2 falls over last 2-3 weeks, but states he tripped. Patient denies any complaints at this time.  Past Medical History:  Diagnosis Date  . Arthritis    "left knee" (07/08/2015)  . Ascites   . Avascular necrosis of bone of left hip (HCC) 07/08/2015  . Brown recluse spider bite 1990s   "they thought I was gonna die then"  . Cirrhosis of liver (HCC)    Sees Dr. Sung Amabile  . Depression   . Edema of both legs    Takes Lasix  . Gout X 1  . High cholesterol   . Hypertension   . Narcolepsy   . Rheumatic fever   . Sleep apnea    pt stated "I have sleep apnea but I do not use the machine...Marland KitchenMarland KitchenMarland Kitchenit broke" (07/08/2015)    Patient Active Problem List   Diagnosis Date Noted  . GIB (gastrointestinal bleeding) 12/27/2015  . Hyperlipidemia 07/23/2015  . Depression 07/23/2015  . Avascular necrosis of bone of left hip (HCC) 07/08/2015  . S/P total hip arthroplasty 07/08/2015  . Ascites due to alcoholic cirrhosis (HCC) 05/19/2015    Past Surgical History:  Procedure Laterality Date  . COSMETIC SURGERY  ~ 1997   "nose done, lip drop, chin lift"  . ESOPHAGOGASTRODUODENOSCOPY N/A 12/27/2015   Procedure: ESOPHAGOGASTRODUODENOSCOPY (EGD);  Surgeon: Charlott Rakes, MD;  Location: [facility]  ENDOSCOPY;  Service: Endoscopy;  Laterality: N/A;  . FOOT FRACTURE SURGERY Right   . FRACTURE SURGERY    . INGUINAL HERNIA REPAIR Bilateral 1990s  . TONSILLECTOMY  1954  . TOTAL HIP ARTHROPLASTY Left 07/08/2015  . TOTAL HIP ARTHROPLASTY Left 07/08/2015   Procedure: LEFT TOTAL HIP ARTHROPLASTY;  Surgeon: Teryl Lucy, MD;  Location: [facility] OR;  Service: Orthopedics;  Laterality: Left;  . TOTAL KNEE ARTHROPLASTY Left 03/2008  . US GUIDED PARACENTESIS [facility] HX)  April 2017    Prior to Admission medications   Medication Sig Start Date End Date Taking? Authorizing Provider  atorvastatin (LIPITOR) 10 MG tablet Take 1 tablet (10 mg total) by mouth daily. 05/20/15   Enid Baas, MD  bismuth subsalicylate (PEPTO BISMOL) 262 MG/15ML suspension Take 30 mLs by mouth every 6 (six) hours as needed.    Historical Provider, MD  furosemide (LASIX) 40 MG tablet Take 1 tablet (40 mg total) by mouth daily. 05/20/15   Enid Baas, MD  losartan (COZAAR) 50 MG tablet Take 1 tablet by mouth daily. 09/02/15 09/01/16  Historical Provider, MD  Multiple Vitamin (MULTIVITAMIN WITH MINERALS) TABS tablet Take 1 tablet by mouth daily.    Historical Provider, MD  ondansetron (ZOFRAN) 4 MG tablet Take 1 tablet (4 mg total) by mouth every 8 (eight) hours as needed for nausea or vomiting. 07/08/15   Teryl Lucy, MD  pantoprazole (PROTONIX)  40 MG tablet Take 1 tablet (40 mg total) by mouth 2 (two) times daily. 12/30/15   Auburn Bilberry, MD  sildenafil (REVATIO) 20 MG tablet Take 3 tablets by mouth daily as needed. 12/03/15   Historical Provider, MD  spironolactone (ALDACTONE) 25 MG tablet Take 1 tablet (25 mg total) by mouth daily. 12/30/15   Auburn Bilberry, MD  traMADol (ULTRAM) 50 MG tablet Take 1 tablet (50 mg total) by mouth every 6 (six) hours as needed for severe pain. 12/31/15 12/30/16  Phineas Semen, MD    No Known Allergies  Family History  Problem Relation Age of Onset  . Melanoma Father   . Lung cancer  Father     Social History Social History  Substance Use Topics  . Smoking status: Never Smoker  . Smokeless tobacco: Never Used  . Alcohol use 0.0 oz/week     Comment: 07/08/2015 former Heavy ETOH abuse.; "haven't had a drink in 2017; AA told me I didn't need to come back"    Review of Systems Constitutional: Negative for fever. Cardiovascular: Negative for chest pain. Respiratory: Negative for shortness of breath. Gastrointestinal: Negative for abdominal pain Neurological: Negative for headache 10-point ROS otherwise negative.  ____________________________________________   PHYSICAL EXAM:  VITAL SIGNS: ED Triage Vitals  Enc Vitals Group     BP 02/23/16 1852 123/72     Pulse Rate 02/23/16 1852 91     Resp 02/23/16 1852 18     Temp 02/23/16 1852 97.9 F (36.6 C)     Temp Source 02/23/16 1852 Oral     SpO2 02/23/16 1852 95 %     Weight 02/23/16 1852 200 lb (90.7 kg)     Height 02/23/16 1852 5' 10.5" (1.791 m)     Head Circumference --      Peak Flow --      Pain Score 02/23/16 2138 0     Pain Loc --      Pain Edu? --      Excl. in GC? --     Constitutional: Alert and oriented. Well appearing and in no distress. Eyes: Normal exam, No notable scleral icterus. ENT   Head: Normocephalic and atraumatic.   Mouth/Throat: Mucous membranes are moist. Cardiovascular: Normal rate, regular rhythm. No murmur Respiratory: Normal respiratory effort without tachypnea nor retractions. Breath sounds are clear  Gastrointestinal: Soft and nontender. No distention. Musculoskeletal: Nontender with normal range of motion in all extremities. Neurologic:  Normal speech and language. No gross focal neurologic deficits Skin:  Skin is warm, dry and intact.  Psychiatric: Mood and affect are normal. Speech and behavior are normal.   ____________________________________________   RADIOLOGY  CT negative for acute  abnormality  ____________________________________________   INITIAL IMPRESSION / ASSESSMENT AND PLAN / ED COURSE  Pertinent labs & imaging results that were available during my care of the patient were reviewed by me and considered in my medical decision making (see chart for details).  The patient presents the emergency department for medical evaluation. Patient has a history of liver disease, has 6 L removed from his abdomen today. There is some concern for some mild confusion so the patient was sent to the emergency department. Here the patient is alert and oriented 4. No distress with no complaints. Patient's labs are consistent with liver dysfunction current meld score of 17 (using last known INR). Ammonia of 72. Patient states since his mother passed away several weeks ago he has not been taking his lactulose as he should. I  stressed the importance of taking his lactulose every day. Given the patient's 2 falls we'll obtain a CT scan of the head. If the CT is normal patient will be discharged home with routine follow-up. Patient has a follow up appointment with Dr. Mechele CollinElliott in 2 weeks.  CT is negative. Patient will be discharged home with PCP follow-up.  ____________________________________________   FINAL CLINICAL IMPRESSION(S) / ED DIAGNOSES  Hyperammonemia    Minna AntisKevin Meshulem Onorato, MD 02/23/16 2227

## 2016-02-23 NOTE — Discharge Instructions (Signed)
As we discussed please follow-up with your primary care doctor in the next 2-3 days for recheck/reevaluation. Return to the emergency department for any abdominal pain, fever, confusion, or any other symptom personally concerning to yourself.

## 2016-02-23 NOTE — ED Notes (Signed)
Pt discharged to home.  Caregiver driving.  Discharge instructions reviewed.  Verbalized understanding.  No questions or concerns at this time.  Teach back verified.  Pt in NAD.  No items left in ED.   

## 2016-03-03 NOTE — Discharge Instructions (Signed)
Paracentesis, Care After °Introduction °Refer to this sheet in the next few weeks. These instructions provide you with information about caring for yourself after your procedure. Your health care provider may also give you more specific instructions. Your treatment has been planned according to current medical practices, but problems sometimes occur. Call your health care provider if you have any problems or questions after your procedure. °What can I expect after the procedure? °After your procedure, it is common to have a small amount of clear fluid coming from the puncture site. °Follow these instructions at home: °· Return to your normal activities as told by your health care provider. Ask your health care provider what activities are safe for you. °· Take over-the-counter and prescription medicines only as told by your health care provider. °· Do not take baths, swim, or use a hot tub until your health care provider approves. °· Follow instructions from your health care provider about: °¨ How to take care of your puncture site. °¨ When and how you should change your bandage (dressing). °¨ When you should remove your dressing. °· Check your puncture area every day signs of infection. Watch for: °¨ Redness, swelling, or pain. °¨ Fluid, blood, or pus. °· Keep all follow-up visits as told by your health care provider. This is important. °Contact a health care provider if: °· You have redness, swelling, or pain at your puncture site. °· You start to have more clear fluid coming from your puncture site. °· You have blood or pus coming from your puncture site. °· You have chills. °· You have a fever. °Get help right away if: °· You develop chest pain or shortness of breath. °· You develop increasing pain, discomfort, or swelling in your abdomen. °· You feel dizzy or light-headed or you pass out. °This information is not intended to replace advice given to you by your health care provider. Make sure you discuss any  questions you have with your health care provider. °Document Released: 06/11/2014 Document Revised: 07/03/2015 Document Reviewed: 04/09/2014 °© 2017 [employer] ° °

## 2016-03-05 DIAGNOSIS — Z0000 Encounter for general adult medical examination without abnormal findings: Secondary | ICD-10-CM | POA: Diagnosis not present

## 2016-03-08 ENCOUNTER — Ambulatory Visit
Admission: RE | Admit: 2016-03-08 | Discharge: 2016-03-08 | Disposition: A | Discharge status: Home / Self Care | Payer: Medicare HMO | Source: Ambulatory Visit | Attending: Unknown Physician Specialty | Admitting: Unknown Physician Specialty

## 2016-03-08 DIAGNOSIS — K7031 Alcoholic cirrhosis of liver with ascites: Secondary | ICD-10-CM | POA: Diagnosis not present

## 2016-03-08 DIAGNOSIS — R188 Other ascites: Secondary | ICD-10-CM | POA: Diagnosis not present

## 2016-03-08 MED ORDER — ALBUMIN HUMAN 25 % IV SOLN
25.0000 g | Freq: Once | INTRAVENOUS | Status: AC
  Administered 2016-03-08: 25 g via INTRAVENOUS
  Filled 2016-03-08: qty 100

## 2016-03-08 NOTE — Procedures (Signed)
US paracentesis without difficulty  Complications:  None  Blood Loss: none  See dictation in canopy pacs  

## 2016-03-15 ENCOUNTER — Emergency Department: Payer: Medicare HMO

## 2016-03-15 ENCOUNTER — Encounter: Payer: Self-pay | Admitting: Emergency Medicine

## 2016-03-15 ENCOUNTER — Emergency Department
Admission: EM | Admit: 2016-03-15 | Discharge: 2016-03-15 | Disposition: A | Discharge status: Home / Self Care | Payer: Medicare HMO | Attending: Emergency Medicine | Admitting: Emergency Medicine

## 2016-03-15 DIAGNOSIS — R140 Abdominal distension (gaseous): Secondary | ICD-10-CM

## 2016-03-15 DIAGNOSIS — R188 Other ascites: Secondary | ICD-10-CM | POA: Diagnosis not present

## 2016-03-15 DIAGNOSIS — K7031 Alcoholic cirrhosis of liver with ascites: Secondary | ICD-10-CM

## 2016-03-15 DIAGNOSIS — I10 Essential (primary) hypertension: Secondary | ICD-10-CM | POA: Diagnosis not present

## 2016-03-15 DIAGNOSIS — R109 Unspecified abdominal pain: Secondary | ICD-10-CM | POA: Diagnosis present

## 2016-03-15 DIAGNOSIS — Z79899 Other long term (current) drug therapy: Secondary | ICD-10-CM | POA: Insufficient documentation

## 2016-03-15 LAB — COMPREHENSIVE METABOLIC PANEL
ALT: 19 U/L (ref 17–63)
AST: 52 U/L — AB (ref 15–41)
Albumin: 2.7 g/dL — ABNORMAL LOW (ref 3.5–5.0)
Alkaline Phosphatase: 69 U/L (ref 38–126)
Anion gap: 8 (ref 5–15)
BILIRUBIN TOTAL: 1.7 mg/dL — AB (ref 0.3–1.2)
BUN: 8 mg/dL (ref 6–20)
CO2: 23 mmol/L (ref 22–32)
CREATININE: 0.94 mg/dL (ref 0.61–1.24)
Calcium: 8.2 mg/dL — ABNORMAL LOW (ref 8.9–10.3)
Chloride: 103 mmol/L (ref 101–111)
Glucose, Bld: 194 mg/dL — ABNORMAL HIGH (ref 65–99)
Potassium: 3 mmol/L — ABNORMAL LOW (ref 3.5–5.1)
Sodium: 134 mmol/L — ABNORMAL LOW (ref 135–145)
TOTAL PROTEIN: 6.9 g/dL (ref 6.5–8.1)

## 2016-03-15 LAB — CBC
HCT: 33.1 % — ABNORMAL LOW (ref 40.0–52.0)
Hemoglobin: 11.1 g/dL — ABNORMAL LOW (ref 13.0–18.0)
MCH: 28.4 pg (ref 26.0–34.0)
MCHC: 33.5 g/dL (ref 32.0–36.0)
MCV: 84.6 fL (ref 80.0–100.0)
PLATELETS: 168 10*3/uL (ref 150–440)
RBC: 3.91 MIL/uL — AB (ref 4.40–5.90)
RDW: 20.7 % — AB (ref 11.5–14.5)
WBC: 8.3 10*3/uL (ref 3.8–10.6)

## 2016-03-15 LAB — LIPASE, BLOOD: Lipase: 36 U/L (ref 11–51)

## 2016-03-15 LAB — AMMONIA: AMMONIA: 61 umol/L — AB (ref 9–35)

## 2016-03-15 MED ORDER — TRAMADOL HCL 50 MG PO TABS
50.0000 mg | ORAL_TABLET | Freq: Once | ORAL | Status: AC
  Administered 2016-03-15: 50 mg via ORAL
  Filled 2016-03-15: qty 1

## 2016-03-15 MED ORDER — ONDANSETRON 4 MG PO TBDP
ORAL_TABLET | ORAL | Status: AC
  Administered 2016-03-15: 4 mg
  Filled 2016-03-15: qty 1

## 2016-03-15 MED ORDER — FUROSEMIDE 40 MG PO TABS
40.0000 mg | ORAL_TABLET | Freq: Every day | ORAL | 2 refills | Status: DC
Start: 2016-03-15 — End: 2016-06-18

## 2016-03-15 MED ORDER — ALBUMIN HUMAN 25 % IV SOLN
25.0000 g | Freq: Once | INTRAVENOUS | Status: AC
  Administered 2016-03-15: 25 g via INTRAVENOUS
  Filled 2016-03-15 (×2): qty 100

## 2016-03-15 NOTE — ED Notes (Signed)
Patient sitting in hallway in wheelchair with friend at side. Albumin infusion going with no signs or symptoms of reaction. At 10 min after start of infusion will increase to 342ml/hour.

## 2016-03-15 NOTE — ED Provider Notes (Signed)
[facility] Emergency Department Provider Note        Time seen: ----------------------------------------- 10:26 AM on 03/15/2016 -----------------------------------------    I have reviewed the triage vital signs and the nursing notes.   HISTORY  Chief Complaint Abdominal Pain    HPI Lawrence West is a 71 y.o. male who presents to the ER for abdominal swelling. Patient reports he is here to have another paracentesis done. Patient had fluid drawn from his abdomen last Monday, states he has been scheduled to have it drawn off every 2 weeks. He denies fevers, chills or other complaints.   Past Medical History:  Diagnosis Date  . Arthritis    "left knee" (07/08/2015)  . Ascites   . Avascular necrosis of bone of left hip (HCC) 07/08/2015  . Brown recluse spider bite 1990s   "they thought I was gonna die then"  . Cirrhosis of liver (HCC)    Sees Dr. Sung Amabile  . Depression   . Edema of both legs    Takes Lasix  . Gout X 1  . High cholesterol   . Hypertension   . Narcolepsy   . Rheumatic fever   . Sleep apnea    pt stated "I have sleep apnea but I do not use the machine...Marland KitchenMarland KitchenMarland Kitchenit broke" (07/08/2015)    Patient Active Problem List   Diagnosis Date Noted  . GIB (gastrointestinal bleeding) 12/27/2015  . Hyperlipidemia 07/23/2015  . Depression 07/23/2015  . Avascular necrosis of bone of left hip (HCC) 07/08/2015  . S/P total hip arthroplasty 07/08/2015  . Ascites due to alcoholic cirrhosis (HCC) 05/19/2015    Past Surgical History:  Procedure Laterality Date  . COSMETIC SURGERY  ~ 1997   "nose done, lip drop, chin lift"  . ESOPHAGOGASTRODUODENOSCOPY N/A 12/27/2015   Procedure: ESOPHAGOGASTRODUODENOSCOPY (EGD);  Surgeon: Charlott Rakes, MD;  Location: [facility] ENDOSCOPY;  Service: Endoscopy;  Laterality: N/A;  . FOOT FRACTURE SURGERY Right   . FRACTURE SURGERY    . INGUINAL HERNIA REPAIR Bilateral 1990s  . TONSILLECTOMY  1954  . TOTAL HIP  ARTHROPLASTY Left 07/08/2015  . TOTAL HIP ARTHROPLASTY Left 07/08/2015   Procedure: LEFT TOTAL HIP ARTHROPLASTY;  Surgeon: Teryl Lucy, MD;  Location: [facility] OR;  Service: Orthopedics;  Laterality: Left;  . TOTAL KNEE ARTHROPLASTY Left 03/2008  . US GUIDED PARACENTESIS [facility] HX)  April 2017    Allergies Patient has no known allergies.  Social History Social History  Substance Use Topics  . Smoking status: Never Smoker  . Smokeless tobacco: Never Used  . Alcohol use 0.0 oz/week     Comment: 07/08/2015 former Heavy ETOH abuse.; "haven't had a drink in 2017; AA told me I didn't need to come back"    Review of Systems Constitutional: Negative for fever. Cardiovascular: Negative for chest pain. Respiratory: Negative for shortness of breath. Gastrointestinal: Positive for abdominal swelling, negative for vomiting and diarrhea Genitourinary: Negative for dysuria. Musculoskeletal: Negative for back pain. Skin: Negative for rash. Neurological: Negative for headaches, focal weakness or numbness.  10-point ROS otherwise negative.  ____________________________________________   PHYSICAL EXAM:  VITAL SIGNS: ED Triage Vitals [03/15/16 0955]  Enc Vitals Group     BP (!) 142/99     Pulse Rate (!) 110     Resp 20     Temp 98 F (36.7 C)     Temp Source Oral     SpO2 98 %     Weight 217 lb (98.4 kg)  Height 5\' 10"  (1.778 m)     Head Circumference      Peak Flow      Pain Score      Pain Loc      Pain Edu?      Excl. in GC?    Constitutional: Alert and oriented. Well appearing and in no distress. Eyes: Scleral icterus PERRL. Normal extraocular movements. ENT   Head: Normocephalic and atraumatic.   Nose: No congestion/rhinnorhea.   Mouth/Throat: Mucous membranes are moist.   Neck: No stridor. Cardiovascular: Normal rate, regular rhythm. No murmurs, rubs, or gallops. Respiratory: Normal respiratory effort without tachypnea nor retractions. Breath sounds are clear  and equal bilaterally. No wheezes/rales/rhonchi. Gastrointestinal: Abdominal distention with ascites, nontender. Musculoskeletal: Nontender with normal range of motion in all extremities. No lower extremity tenderness nor edema. Neurologic:  Normal speech and language. No gross focal neurologic deficits are appreciated.  Skin:  Skin is warm, dry and intact. Jaundice is noted Psychiatric: Mood and affect are normal. Speech and behavior are normal.  ____________________________________________  ED COURSE:  Pertinent labs & imaging results that were available during my care of the patient were reviewed by me and considered in my medical decision making (see chart for details). Patient presents to the ER in no distress with abdominal swelling. We will assess basic labs and discuss with GI or arrange outpatient paracentesis.   Procedures ____________________________________________   LABS (pertinent positives/negatives)  Labs Reviewed  COMPREHENSIVE METABOLIC PANEL - Abnormal; Notable for the following:       Result Value   Sodium 134 (*)    Potassium 3.0 (*)    Glucose, Bld 194 (*)    Calcium 8.2 (*)    Albumin 2.7 (*)    AST 52 (*)    Total Bilirubin 1.7 (*)    All other components within normal limits  CBC - Abnormal; Notable for the following:    RBC 3.91 (*)    Hemoglobin 11.1 (*)    HCT 33.1 (*)    RDW 20.7 (*)    All other components within normal limits  AMMONIA - Abnormal; Notable for the following:    Ammonia 61 (*)    All other components within normal limits  LIPASE, BLOOD  URINALYSIS, COMPLETE (UACMP) WITH MICROSCOPIC  PROTIME-INR   EKG: Interpreted by me, sinus rhythm rate of 96 bpm, normal PR interval, baseline artifact, likely old inferior infarct, normal QT  RADIOLOGY  Ultrasound-guided paracentesis  ____________________________________________  FINAL ASSESSMENT AND PLAN  Ascites, cirrhosis   Plan: Patient with labs and imaging as dictated above.  Patient underwent paracentesis for symptomatic relief of abdominal distention due to ascites. I have written for Lasix which he had run out of. He is stable for outpatient follow-up.   Emily FilbertWilliams, Jonathan E, MD   Note: This note was generated in part or whole with voice recognition software. Voice recognition is usually quite accurate but there are transcription errors that can and very often do occur. I apologize for any typographical errors that were not detected and corrected.     Emily FilbertJonathan E Williams, MD 03/15/16 [phone]

## 2016-03-15 NOTE — ED Triage Notes (Signed)
Pt presents with abd swelling. Pt did have fluid drawn from abd last Monday.

## 2016-03-22 ENCOUNTER — Ambulatory Visit
Admission: RE | Admit: 2016-03-22 | Discharge: 2016-03-22 | Disposition: A | Discharge status: Home / Self Care | Payer: Medicare HMO | Source: Ambulatory Visit | Attending: Unknown Physician Specialty | Admitting: Unknown Physician Specialty

## 2016-03-22 ENCOUNTER — Other Ambulatory Visit: Payer: Self-pay | Admitting: Unknown Physician Specialty

## 2016-03-22 DIAGNOSIS — K7031 Alcoholic cirrhosis of liver with ascites: Secondary | ICD-10-CM

## 2016-03-22 DIAGNOSIS — R188 Other ascites: Secondary | ICD-10-CM | POA: Diagnosis not present

## 2016-03-22 MED ORDER — ALBUMIN HUMAN 25 % IV SOLN
25.0000 g | Freq: Once | INTRAVENOUS | Status: AC
  Administered 2016-03-22: 25 g via INTRAVENOUS
  Filled 2016-03-22: qty 100

## 2016-03-26 ENCOUNTER — Other Ambulatory Visit: Payer: Self-pay | Admitting: Unknown Physician Specialty

## 2016-03-26 DIAGNOSIS — K7031 Alcoholic cirrhosis of liver with ascites: Secondary | ICD-10-CM

## 2016-03-29 ENCOUNTER — Ambulatory Visit
Admission: RE | Admit: 2016-03-29 | Discharge: 2016-03-29 | Disposition: A | Discharge status: Home / Self Care | Payer: Medicare HMO | Source: Ambulatory Visit | Attending: Unknown Physician Specialty | Admitting: Unknown Physician Specialty

## 2016-03-29 DIAGNOSIS — R188 Other ascites: Secondary | ICD-10-CM | POA: Diagnosis not present

## 2016-03-29 DIAGNOSIS — K7031 Alcoholic cirrhosis of liver with ascites: Secondary | ICD-10-CM | POA: Diagnosis not present

## 2016-03-29 MED ORDER — ALBUMIN HUMAN 25 % IV SOLN
25.0000 g | Freq: Once | INTRAVENOUS | Status: AC
  Administered 2016-03-29: 25 g via INTRAVENOUS
  Filled 2016-03-29: qty 100

## 2016-03-30 NOTE — Discharge Instructions (Signed)
Paracentesis, Care After °Introduction °Refer to this sheet in the next few weeks. These instructions provide you with information about caring for yourself after your procedure. Your health care provider may also give you more specific instructions. Your treatment has been planned according to current medical practices, but problems sometimes occur. Call your health care provider if you have any problems or questions after your procedure. °What can I expect after the procedure? °After your procedure, it is common to have a small amount of clear fluid coming from the puncture site. °Follow these instructions at home: °· Return to your normal activities as told by your health care provider. Ask your health care provider what activities are safe for you. °· Take over-the-counter and prescription medicines only as told by your health care provider. °· Do not take baths, swim, or use a hot tub until your health care provider approves. °· Follow instructions from your health care provider about: °¨ How to take care of your puncture site. °¨ When and how you should change your bandage (dressing). °¨ When you should remove your dressing. °· Check your puncture area every day signs of infection. Watch for: °¨ Redness, swelling, or pain. °¨ Fluid, blood, or pus. °· Keep all follow-up visits as told by your health care provider. This is important. °Contact a health care provider if: °· You have redness, swelling, or pain at your puncture site. °· You start to have more clear fluid coming from your puncture site. °· You have blood or pus coming from your puncture site. °· You have chills. °· You have a fever. °Get help right away if: °· You develop chest pain or shortness of breath. °· You develop increasing pain, discomfort, or swelling in your abdomen. °· You feel dizzy or light-headed or you pass out. °This information is not intended to replace advice given to you by your health care provider. Make sure you discuss any  questions you have with your health care provider. °Document Released: 06/11/2014 Document Revised: 07/03/2015 Document Reviewed: 04/09/2014 °© 2017 [employer] ° °

## 2016-03-31 DIAGNOSIS — K7031 Alcoholic cirrhosis of liver with ascites: Secondary | ICD-10-CM | POA: Diagnosis not present

## 2016-04-01 DIAGNOSIS — H5213 Myopia, bilateral: Secondary | ICD-10-CM | POA: Diagnosis not present

## 2016-04-05 ENCOUNTER — Ambulatory Visit
Admission: RE | Admit: 2016-04-05 | Discharge: 2016-04-05 | Disposition: A | Discharge status: Home / Self Care | Payer: Medicare HMO | Source: Ambulatory Visit | Attending: Unknown Physician Specialty | Admitting: Unknown Physician Specialty

## 2016-04-05 DIAGNOSIS — K7031 Alcoholic cirrhosis of liver with ascites: Secondary | ICD-10-CM | POA: Diagnosis not present

## 2016-04-05 DIAGNOSIS — R188 Other ascites: Secondary | ICD-10-CM | POA: Diagnosis not present

## 2016-04-05 MED ORDER — ALBUMIN HUMAN 25 % IV SOLN
25.0000 g | Freq: Once | INTRAVENOUS | Status: AC
  Administered 2016-04-05: 25 g via INTRAVENOUS
  Filled 2016-04-05: qty 100

## 2016-04-06 NOTE — Discharge Instructions (Signed)
Paracentesis, Care After  Refer to this sheet in the next few weeks. These instructions provide you with information about caring for yourself after your procedure. Your health care provider may also give you more specific instructions. Your treatment has been planned according to current medical practices, but problems sometimes occur. Call your health care provider if you have any problems or questions after your procedure.  What can I expect after the procedure?  After your procedure, it is common to have a small amount of clear fluid coming from the puncture site.  Follow these instructions at home:  · Return to your normal activities as told by your health care provider. Ask your health care provider what activities are safe for you.  · Take over-the-counter and prescription medicines only as told by your health care provider.  · Do not take baths, swim, or use a hot tub until your health care provider approves.  · Follow instructions from your health care provider about:  ? How to take care of your puncture site.  ? When and how you should change your bandage (dressing).  ? When you should remove your dressing.  · Check your puncture area every day signs of infection. Watch for:  ? Redness, swelling, or pain.  ? Fluid, blood, or pus.  · Keep all follow-up visits as told by your health care provider. This is important.  Contact a health care provider if:  · You have redness, swelling, or pain at your puncture site.  · You start to have more clear fluid coming from your puncture site.  · You have blood or pus coming from your puncture site.  · You have chills.  · You have a fever.  Get help right away if:  · You develop chest pain or shortness of breath.  · You develop increasing pain, discomfort, or swelling in your abdomen.  · You feel dizzy or light-headed or you pass out.  This information is not intended to replace advice given to you by your health care provider. Make sure you discuss any questions you  have with your health care provider.  Document Released: 06/11/2014 Document Revised: 07/03/2015 Document Reviewed: 04/09/2014  Elsevier Interactive Patient Education © 2017 [employer]

## 2016-04-12 ENCOUNTER — Ambulatory Visit
Admission: RE | Admit: 2016-04-12 | Discharge: 2016-04-12 | Disposition: A | Discharge status: Home / Self Care | Payer: Medicare HMO | Source: Ambulatory Visit | Attending: Unknown Physician Specialty | Admitting: Unknown Physician Specialty

## 2016-04-12 DIAGNOSIS — K7031 Alcoholic cirrhosis of liver with ascites: Secondary | ICD-10-CM | POA: Diagnosis not present

## 2016-04-12 DIAGNOSIS — R188 Other ascites: Secondary | ICD-10-CM | POA: Diagnosis not present

## 2016-04-12 MED ORDER — ALBUMIN HUMAN 25 % IV SOLN
25.0000 g | Freq: Once | INTRAVENOUS | Status: AC
  Administered 2016-04-12: 25 g via INTRAVENOUS
  Filled 2016-04-12: qty 100

## 2016-04-12 NOTE — Procedures (Signed)
Successful US guided paracentesis yielding 5.5 L of serous ascitic fluid.  EBL: None No immediate post procedural complications.   Katherina RightJay Morine Kohlman, MD Pager #: [phone]

## 2016-04-15 NOTE — Discharge Instructions (Signed)
Paracentesis, Care After  Refer to this sheet in the next few weeks. These instructions provide you with information about caring for yourself after your procedure. Your health care provider may also give you more specific instructions. Your treatment has been planned according to current medical practices, but problems sometimes occur. Call your health care provider if you have any problems or questions after your procedure.  What can I expect after the procedure?  After your procedure, it is common to have a small amount of clear fluid coming from the puncture site.  Follow these instructions at home:  · Return to your normal activities as told by your health care provider. Ask your health care provider what activities are safe for you.  · Take over-the-counter and prescription medicines only as told by your health care provider.  · Do not take baths, swim, or use a hot tub until your health care provider approves.  · Follow instructions from your health care provider about:  ? How to take care of your puncture site.  ? When and how you should change your bandage (dressing).  ? When you should remove your dressing.  · Check your puncture area every day signs of infection. Watch for:  ? Redness, swelling, or pain.  ? Fluid, blood, or pus.  · Keep all follow-up visits as told by your health care provider. This is important.  Contact a health care provider if:  · You have redness, swelling, or pain at your puncture site.  · You start to have more clear fluid coming from your puncture site.  · You have blood or pus coming from your puncture site.  · You have chills.  · You have a fever.  Get help right away if:  · You develop chest pain or shortness of breath.  · You develop increasing pain, discomfort, or swelling in your abdomen.  · You feel dizzy or light-headed or you pass out.  This information is not intended to replace advice given to you by your health care provider. Make sure you discuss any questions you  have with your health care provider.  Document Released: 06/11/2014 Document Revised: 07/03/2015 Document Reviewed: 04/09/2014  Elsevier Interactive Patient Education © 2017 [employer]

## 2016-04-19 ENCOUNTER — Ambulatory Visit
Admission: RE | Admit: 2016-04-19 | Discharge: 2016-04-19 | Disposition: A | Discharge status: Home / Self Care | Payer: Medicare HMO | Source: Ambulatory Visit | Attending: Unknown Physician Specialty | Admitting: Unknown Physician Specialty

## 2016-04-19 DIAGNOSIS — K7031 Alcoholic cirrhosis of liver with ascites: Secondary | ICD-10-CM | POA: Insufficient documentation

## 2016-04-19 DIAGNOSIS — R188 Other ascites: Secondary | ICD-10-CM | POA: Diagnosis not present

## 2016-04-19 MED ORDER — ALBUMIN HUMAN 25 % IV SOLN
25.0000 g | Freq: Once | INTRAVENOUS | Status: AC
  Administered 2016-04-19: 25 g via INTRAVENOUS
  Filled 2016-04-19: qty 100

## 2016-04-26 ENCOUNTER — Ambulatory Visit
Admission: RE | Admit: 2016-04-26 | Discharge: 2016-04-26 | Disposition: A | Discharge status: Home / Self Care | Payer: Medicare HMO | Source: Ambulatory Visit | Attending: Unknown Physician Specialty | Admitting: Unknown Physician Specialty

## 2016-04-26 DIAGNOSIS — K7031 Alcoholic cirrhosis of liver with ascites: Secondary | ICD-10-CM | POA: Diagnosis not present

## 2016-04-26 MED ORDER — ALBUMIN HUMAN 25 % IV SOLN
25.0000 g | Freq: Once | INTRAVENOUS | Status: AC
  Administered 2016-04-26: 25 g via INTRAVENOUS
  Filled 2016-04-26: qty 100

## 2016-04-26 NOTE — Procedures (Signed)
US guided paracentesis.  Removed 7.3 liters of yellow fluid.  No immediate complication.  Minimal blood loss.

## 2016-04-26 NOTE — Discharge Instructions (Signed)
Paracentesis, Care After  Refer to this sheet in the next few weeks. These instructions provide you with information about caring for yourself after your procedure. Your health care provider may also give you more specific instructions. Your treatment has been planned according to current medical practices, but problems sometimes occur. Call your health care provider if you have any problems or questions after your procedure.  What can I expect after the procedure?  After your procedure, it is common to have a small amount of clear fluid coming from the puncture site.  Follow these instructions at home:  · Return to your normal activities as told by your health care provider. Ask your health care provider what activities are safe for you.  · Take over-the-counter and prescription medicines only as told by your health care provider.  · Do not take baths, swim, or use a hot tub until your health care provider approves.  · Follow instructions from your health care provider about:  ? How to take care of your puncture site.  ? When and how you should change your bandage (dressing).  ? When you should remove your dressing.  · Check your puncture area every day signs of infection. Watch for:  ? Redness, swelling, or pain.  ? Fluid, blood, or pus.  · Keep all follow-up visits as told by your health care provider. This is important.  Contact a health care provider if:  · You have redness, swelling, or pain at your puncture site.  · You start to have more clear fluid coming from your puncture site.  · You have blood or pus coming from your puncture site.  · You have chills.  · You have a fever.  Get help right away if:  · You develop chest pain or shortness of breath.  · You develop increasing pain, discomfort, or swelling in your abdomen.  · You feel dizzy or light-headed or you pass out.  This information is not intended to replace advice given to you by your health care provider. Make sure you discuss any questions you  have with your health care provider.  Document Released: 06/11/2014 Document Revised: 07/03/2015 Document Reviewed: 04/09/2014  Elsevier Interactive Patient Education © 2017 [employer]

## 2016-04-29 NOTE — Discharge Instructions (Signed)
Paracentesis, Care After  Refer to this sheet in the next few weeks. These instructions provide you with information about caring for yourself after your procedure. Your health care provider may also give you more specific instructions. Your treatment has been planned according to current medical practices, but problems sometimes occur. Call your health care provider if you have any problems or questions after your procedure.  What can I expect after the procedure?  After your procedure, it is common to have a small amount of clear fluid coming from the puncture site.  Follow these instructions at home:  · Return to your normal activities as told by your health care provider. Ask your health care provider what activities are safe for you.  · Take over-the-counter and prescription medicines only as told by your health care provider.  · Do not take baths, swim, or use a hot tub until your health care provider approves.  · Follow instructions from your health care provider about:  ? How to take care of your puncture site.  ? When and how you should change your bandage (dressing).  ? When you should remove your dressing.  · Check your puncture area every day signs of infection. Watch for:  ? Redness, swelling, or pain.  ? Fluid, blood, or pus.  · Keep all follow-up visits as told by your health care provider. This is important.  Contact a health care provider if:  · You have redness, swelling, or pain at your puncture site.  · You start to have more clear fluid coming from your puncture site.  · You have blood or pus coming from your puncture site.  · You have chills.  · You have a fever.  Get help right away if:  · You develop chest pain or shortness of breath.  · You develop increasing pain, discomfort, or swelling in your abdomen.  · You feel dizzy or light-headed or you pass out.  This information is not intended to replace advice given to you by your health care provider. Make sure you discuss any questions you  have with your health care provider.  Document Released: 06/11/2014 Document Revised: 07/03/2015 Document Reviewed: 04/09/2014  Elsevier Interactive Patient Education © 2017 [employer]

## 2016-05-03 ENCOUNTER — Ambulatory Visit
Admission: RE | Admit: 2016-05-03 | Discharge: 2016-05-03 | Disposition: A | Discharge status: Home / Self Care | Payer: Medicare HMO | Source: Ambulatory Visit | Attending: Unknown Physician Specialty | Admitting: Unknown Physician Specialty

## 2016-05-03 DIAGNOSIS — K7031 Alcoholic cirrhosis of liver with ascites: Secondary | ICD-10-CM | POA: Diagnosis not present

## 2016-05-03 DIAGNOSIS — R188 Other ascites: Secondary | ICD-10-CM | POA: Diagnosis not present

## 2016-05-03 MED ORDER — ALBUMIN HUMAN 25 % IV SOLN
25.0000 g | Freq: Once | INTRAVENOUS | Status: AC
  Administered 2016-05-03: 25 g via INTRAVENOUS
  Filled 2016-05-03: qty 100

## 2016-05-10 ENCOUNTER — Other Ambulatory Visit: Payer: Self-pay | Admitting: Unknown Physician Specialty

## 2016-05-10 ENCOUNTER — Ambulatory Visit
Admission: RE | Admit: 2016-05-10 | Discharge: 2016-05-10 | Disposition: A | Discharge status: Home / Self Care | Payer: Medicare HMO | Source: Ambulatory Visit | Attending: Unknown Physician Specialty | Admitting: Unknown Physician Specialty

## 2016-05-10 DIAGNOSIS — R188 Other ascites: Secondary | ICD-10-CM

## 2016-05-10 DIAGNOSIS — K7031 Alcoholic cirrhosis of liver with ascites: Secondary | ICD-10-CM | POA: Diagnosis not present

## 2016-05-10 MED ORDER — ALBUMIN HUMAN 25 % IV SOLN
25.0000 g | Freq: Once | INTRAVENOUS | Status: AC
  Administered 2016-05-10: 25 g via INTRAVENOUS
  Filled 2016-05-10: qty 100

## 2016-05-17 ENCOUNTER — Ambulatory Visit
Admission: RE | Admit: 2016-05-17 | Discharge: 2016-05-17 | Disposition: A | Discharge status: Home / Self Care | Payer: Medicare HMO | Source: Ambulatory Visit | Attending: Family Medicine | Admitting: Family Medicine

## 2016-05-17 DIAGNOSIS — R188 Other ascites: Secondary | ICD-10-CM | POA: Diagnosis not present

## 2016-05-17 MED ORDER — ALBUMIN HUMAN 25 % IV SOLN
25.0000 g | Freq: Once | INTRAVENOUS | Status: AC
  Administered 2016-05-17: 25 g via INTRAVENOUS
  Filled 2016-05-17 (×2): qty 100

## 2016-05-21 MED ORDER — ALBUMIN HUMAN 25 % IV SOLN
25.0000 g | Freq: Once | INTRAVENOUS | Status: AC
  Administered 2016-05-24: 25 g via INTRAVENOUS
  Filled 2016-05-21 (×2): qty 100

## 2016-05-21 NOTE — Discharge Instructions (Signed)
Paracentesis, Care After  Refer to this sheet in the next few weeks. These instructions provide you with information about caring for yourself after your procedure. Your health care provider may also give you more specific instructions. Your treatment has been planned according to current medical practices, but problems sometimes occur. Call your health care provider if you have any problems or questions after your procedure.  What can I expect after the procedure?  After your procedure, it is common to have a small amount of clear fluid coming from the puncture site.  Follow these instructions at home:  · Return to your normal activities as told by your health care provider. Ask your health care provider what activities are safe for you.  · Take over-the-counter and prescription medicines only as told by your health care provider.  · Do not take baths, swim, or use a hot tub until your health care provider approves.  · Follow instructions from your health care provider about:  ? How to take care of your puncture site.  ? When and how you should change your bandage (dressing).  ? When you should remove your dressing.  · Check your puncture area every day signs of infection. Watch for:  ? Redness, swelling, or pain.  ? Fluid, blood, or pus.  · Keep all follow-up visits as told by your health care provider. This is important.  Contact a health care provider if:  · You have redness, swelling, or pain at your puncture site.  · You start to have more clear fluid coming from your puncture site.  · You have blood or pus coming from your puncture site.  · You have chills.  · You have a fever.  Get help right away if:  · You develop chest pain or shortness of breath.  · You develop increasing pain, discomfort, or swelling in your abdomen.  · You feel dizzy or light-headed or you pass out.  This information is not intended to replace advice given to you by your health care provider. Make sure you discuss any questions you  have with your health care provider.  Document Released: 06/11/2014 Document Revised: 07/03/2015 Document Reviewed: 04/09/2014  Elsevier Interactive Patient Education © 2017 [employer]

## 2016-05-24 ENCOUNTER — Ambulatory Visit
Admission: RE | Admit: 2016-05-24 | Discharge: 2016-05-24 | Disposition: A | Discharge status: Home / Self Care | Payer: Medicare HMO | Source: Ambulatory Visit | Attending: Family Medicine | Admitting: Family Medicine

## 2016-05-24 DIAGNOSIS — K7031 Alcoholic cirrhosis of liver with ascites: Secondary | ICD-10-CM

## 2016-05-24 DIAGNOSIS — R188 Other ascites: Secondary | ICD-10-CM | POA: Insufficient documentation

## 2016-05-24 MED ORDER — ALBUMIN HUMAN 25 % IV SOLN: 25.0000 g | Freq: Once | INTRAVENOUS | Status: DC

## 2016-05-26 ENCOUNTER — Other Ambulatory Visit: Payer: Self-pay | Admitting: Unknown Physician Specialty

## 2016-05-26 DIAGNOSIS — K7031 Alcoholic cirrhosis of liver with ascites: Secondary | ICD-10-CM

## 2016-05-31 ENCOUNTER — Ambulatory Visit
Admission: RE | Admit: 2016-05-31 | Discharge: 2016-05-31 | Disposition: A | Discharge status: Home / Self Care | Payer: Medicare HMO | Source: Ambulatory Visit | Attending: Family Medicine | Admitting: Family Medicine

## 2016-05-31 DIAGNOSIS — K7031 Alcoholic cirrhosis of liver with ascites: Secondary | ICD-10-CM

## 2016-05-31 DIAGNOSIS — R188 Other ascites: Secondary | ICD-10-CM | POA: Diagnosis not present

## 2016-05-31 MED ORDER — ALBUMIN HUMAN 25 % IV SOLN: 25.0000 g | Freq: Once | INTRAVENOUS | Status: DC

## 2016-05-31 MED ORDER — ALBUMIN HUMAN 25 % IV SOLN
25.0000 g | Freq: Once | INTRAVENOUS | Status: AC
  Administered 2016-05-31: 25 g via INTRAVENOUS
  Filled 2016-05-31: qty 100

## 2016-05-31 NOTE — Procedures (Signed)
Ultrasound-guided therapeutic paracentesis performed yielding 6.6 liters of yellow colored fluid.  He did receive 25g of Albumin after the procedure. No immediate complications.  Traxton Kolenda E 12:15 PM 05/31/2016

## 2016-06-04 ENCOUNTER — Other Ambulatory Visit: Payer: Self-pay | Admitting: Unknown Physician Specialty

## 2016-06-04 ENCOUNTER — Ambulatory Visit
Admission: RE | Admit: 2016-06-04 | Discharge: 2016-06-04 | Disposition: A | Discharge status: Home / Self Care | Payer: Medicare HMO | Source: Ambulatory Visit | Attending: Unknown Physician Specialty | Admitting: Unknown Physician Specialty

## 2016-06-04 DIAGNOSIS — K7031 Alcoholic cirrhosis of liver with ascites: Secondary | ICD-10-CM

## 2016-06-04 DIAGNOSIS — R188 Other ascites: Secondary | ICD-10-CM | POA: Diagnosis not present

## 2016-06-04 MED ORDER — ALBUMIN HUMAN 25 % IV SOLN
25.0000 g | Freq: Once | INTRAVENOUS | Status: DC
  Filled 2016-06-04: qty 100

## 2016-06-06 ENCOUNTER — Emergency Department: Payer: Medicare HMO

## 2016-06-06 ENCOUNTER — Emergency Department
Admission: EM | Admit: 2016-06-06 | Discharge: 2016-06-07 | Disposition: A | Discharge status: Home / Self Care | Payer: Medicare HMO | Attending: Emergency Medicine | Admitting: Emergency Medicine

## 2016-06-06 DIAGNOSIS — S3992XA Unspecified injury of lower back, initial encounter: Secondary | ICD-10-CM | POA: Diagnosis not present

## 2016-06-06 DIAGNOSIS — Y939 Activity, unspecified: Secondary | ICD-10-CM | POA: Insufficient documentation

## 2016-06-06 DIAGNOSIS — I10 Essential (primary) hypertension: Secondary | ICD-10-CM | POA: Diagnosis not present

## 2016-06-06 DIAGNOSIS — E860 Dehydration: Secondary | ICD-10-CM | POA: Diagnosis not present

## 2016-06-06 DIAGNOSIS — Y999 Unspecified external cause status: Secondary | ICD-10-CM | POA: Diagnosis not present

## 2016-06-06 DIAGNOSIS — S299XXA Unspecified injury of thorax, initial encounter: Secondary | ICD-10-CM | POA: Diagnosis not present

## 2016-06-06 DIAGNOSIS — F1290 Cannabis use, unspecified, uncomplicated: Secondary | ICD-10-CM | POA: Diagnosis not present

## 2016-06-06 DIAGNOSIS — Z79899 Other long term (current) drug therapy: Secondary | ICD-10-CM | POA: Diagnosis not present

## 2016-06-06 DIAGNOSIS — W19XXXA Unspecified fall, initial encounter: Secondary | ICD-10-CM

## 2016-06-06 DIAGNOSIS — Y929 Unspecified place or not applicable: Secondary | ICD-10-CM | POA: Diagnosis not present

## 2016-06-06 DIAGNOSIS — M545 Low back pain: Secondary | ICD-10-CM | POA: Insufficient documentation

## 2016-06-06 DIAGNOSIS — R55 Syncope and collapse: Secondary | ICD-10-CM | POA: Diagnosis not present

## 2016-06-06 DIAGNOSIS — W1809XA Striking against other object with subsequent fall, initial encounter: Secondary | ICD-10-CM | POA: Insufficient documentation

## 2016-06-06 DIAGNOSIS — I6782 Cerebral ischemia: Secondary | ICD-10-CM | POA: Diagnosis not present

## 2016-06-06 DIAGNOSIS — S0990XA Unspecified injury of head, initial encounter: Secondary | ICD-10-CM | POA: Diagnosis not present

## 2016-06-06 LAB — CBC
HCT: 42.8 % (ref 40.0–52.0)
HEMOGLOBIN: 13.9 g/dL (ref 13.0–18.0)
MCH: 29.3 pg (ref 26.0–34.0)
MCHC: 32.5 g/dL (ref 32.0–36.0)
MCV: 90.1 fL (ref 80.0–100.0)
Platelets: 175 10*3/uL (ref 150–440)
RBC: 4.75 MIL/uL (ref 4.40–5.90)
RDW: 19.4 % — ABNORMAL HIGH (ref 11.5–14.5)
WBC: 12 10*3/uL — AB (ref 3.8–10.6)

## 2016-06-06 LAB — BASIC METABOLIC PANEL
ANION GAP: 11 (ref 5–15)
BUN: 10 mg/dL (ref 6–20)
CHLORIDE: 101 mmol/L (ref 101–111)
CO2: 23 mmol/L (ref 22–32)
Calcium: 8.5 mg/dL — ABNORMAL LOW (ref 8.9–10.3)
Creatinine, Ser: 0.87 mg/dL (ref 0.61–1.24)
GFR calc non Af Amer: >60 mL/min (ref >60)
Glucose, Bld: 131 mg/dL — ABNORMAL HIGH (ref 65–99)
Potassium: 3.5 mmol/L (ref 3.5–5.1)
Sodium: 135 mmol/L (ref 135–145)

## 2016-06-06 LAB — CK: Total CK: 178 U/L (ref 49–397)

## 2016-06-06 MED ORDER — SODIUM CHLORIDE 0.9 % IV BOLUS (SEPSIS)
1000.0000 mL | Freq: Once | INTRAVENOUS | Status: AC
  Administered 2016-06-06: 1000 mL via INTRAVENOUS

## 2016-06-06 NOTE — ED Notes (Signed)
Pt resting quietly, cont to monitor °

## 2016-06-06 NOTE — ED Notes (Signed)
Dr Lenard Lance to bedside with discharge plan and results

## 2016-06-06 NOTE — ED Notes (Signed)
Attempted IV access x1. Unsuccessful.  

## 2016-06-06 NOTE — ED Triage Notes (Signed)
Reports fell this morning and laid in the floor for several hours.  Thinks he tripped over a blanket.  Patient reports had not been eating well. Patient with noted skin tear to left elbow and multiple bruises to left arm.  Patient reports abdomen is distended.

## 2016-06-06 NOTE — ED Provider Notes (Signed)
[facility] Emergency Department Provider Note  Time seen: 10:37 PM  I have reviewed the triage vital signs and the nursing notes.   HISTORY  Chief Complaint Fall and Dehydration    HPI Lawrence West is a 71 y.o. male with a past medical history of liver disease, cirrhosis, depression, hypertension, hyperlipidemia, presents to the emergency department after a fall. According to the patient he fell last night around 11:00 and states he cannot get up until approximately noon today. Patient states he had just sleeping on the floor and then slowly getting to his phone today to call for help. Patient saw a complaint is mild lower back pain. Does not believe he hit his head. But he is not sure. Patient denies any nausea vomiting or diarrhea, fever, recent illness cough, congestion. Denies abdominal pain but does state abdominal distention which she states is fairly normal. Gets a paracentesis approximately once per week. Patient believes he may be dehydrated after spending the night on the floor.  Past Medical History:  Diagnosis Date  . Arthritis    "left knee" (07/08/2015)  . Ascites   . Avascular necrosis of bone of left hip (HCC) 07/08/2015  . Brown recluse spider bite 1990s   "they thought I was gonna die then"  . Cirrhosis of liver (HCC)    Sees Dr. Sung Amabile  . Depression   . Edema of both legs    Takes Lasix  . Gout X 1  . High cholesterol   . Hypertension   . Narcolepsy   . Rheumatic fever   . Sleep apnea    pt stated "I have sleep apnea but I do not use the machine...Marland KitchenMarland KitchenMarland Kitchenit broke" (07/08/2015)    Patient Active Problem List   Diagnosis Date Noted  . GIB (gastrointestinal bleeding) 12/27/2015  . Hyperlipidemia 07/23/2015  . Depression 07/23/2015  . Avascular necrosis of bone of left hip (HCC) 07/08/2015  . S/P total hip arthroplasty 07/08/2015  . Ascites due to alcoholic cirrhosis (HCC) 05/19/2015    Past Surgical History:  Procedure  Laterality Date  . COSMETIC SURGERY  ~ 1997   "nose done, lip drop, chin lift"  . ESOPHAGOGASTRODUODENOSCOPY N/A 12/27/2015   Procedure: ESOPHAGOGASTRODUODENOSCOPY (EGD);  Surgeon: Charlott Rakes, MD;  Location: [facility] ENDOSCOPY;  Service: Endoscopy;  Laterality: N/A;  . FOOT FRACTURE SURGERY Right   . FRACTURE SURGERY    . INGUINAL HERNIA REPAIR Bilateral 1990s  . TONSILLECTOMY  1954  . TOTAL HIP ARTHROPLASTY Left 07/08/2015  . TOTAL HIP ARTHROPLASTY Left 07/08/2015   Procedure: LEFT TOTAL HIP ARTHROPLASTY;  Surgeon: Teryl Lucy, MD;  Location: [facility] OR;  Service: Orthopedics;  Laterality: Left;  . TOTAL KNEE ARTHROPLASTY Left 03/2008  . US GUIDED PARACENTESIS [facility] HX)  April 2017    Prior to Admission medications   Medication Sig Start Date End Date Taking? Authorizing Provider  atorvastatin (LIPITOR) 10 MG tablet Take 1 tablet (10 mg total) by mouth daily. 05/20/15   Enid Baas, MD  bismuth subsalicylate (PEPTO BISMOL) 262 MG/15ML suspension Take 30 mLs by mouth every 6 (six) hours as needed.    Historical Provider, MD  furosemide (LASIX) 40 MG tablet Take 1 tablet (40 mg total) by mouth daily. 03/15/16   Emily Filbert, MD  lactulose (CHRONULAC) 10 GM/15ML solution Take 5 g by mouth 2 (two) times daily.    Historical Provider, MD  losartan (COZAAR) 50 MG tablet Take 1 tablet by mouth daily. 09/02/15 09/01/16  Historical Provider,  MD  ondansetron (ZOFRAN) 4 MG tablet Take 1 tablet (4 mg total) by mouth every 8 (eight) hours as needed for nausea or vomiting. 07/08/15   Teryl Lucy, MD  pantoprazole (PROTONIX) 40 MG tablet Take 1 tablet (40 mg total) by mouth 2 (two) times daily. 12/30/15   Auburn Bilberry, MD  spironolactone (ALDACTONE) 25 MG tablet Take 1 tablet (25 mg total) by mouth daily. 12/30/15   Auburn Bilberry, MD  traMADol (ULTRAM) 50 MG tablet Take 1 tablet (50 mg total) by mouth every 6 (six) hours as needed for severe pain. 12/31/15 12/30/16  Phineas Semen, MD    No  Known Allergies  Family History  Problem Relation Age of Onset  . Melanoma Father   . Lung cancer Father     Social History Social History  Substance Use Topics  . Smoking status: Never Smoker  . Smokeless tobacco: Never Used  . Alcohol use 0.0 oz/week     Comment: 07/08/2015 former Heavy ETOH abuse.; "haven't had a drink in 2017; AA told me I didn't need to come back"    Review of Systems Constitutional: Negative for fever. Eyes: Negative for visual changes. ENT: Negative for congestion Cardiovascular: Negative for chest pain. Respiratory: Negative for shortness of breath. Gastrointestinal: Negative for abdominal pain, vomiting and diarrhea. Genitourinary: Negative for dysuria. Musculoskeletal:Mild lower back pain Skin: Negative for rash. Neurological: Negative for headache All other ROS negative  ____________________________________________   PHYSICAL EXAM:  VITAL SIGNS: ED Triage Vitals  Enc Vitals Group     BP 06/06/16 2105 (!) 130/94     Pulse Rate 06/06/16 2105 (!) 125     Resp 06/06/16 2105 20     Temp 06/06/16 2105 98.6 F (37 C)     Temp Source 06/06/16 2105 Oral     SpO2 06/06/16 2105 95 %     Weight 06/06/16 2106 190 lb (86.2 kg)     Height 06/06/16 2106  (1.778 m)     Head Circumference --      Peak Flow --      Pain Score --      Pain Loc --      Pain Edu? --      Excl. in GC? --     Constitutional: Alert. Chronically ill-appearing but in no distress. Eyes: Normal exam ENT   Head: Normocephalic and atraumatic.   Mouth/Throat: Mucous membranes are moist. Cardiovascular: Normal rate, regular rhythm. No murmur Respiratory: Normal respiratory effort without tachypnea nor retractions. Breath sounds are clear  Gastrointestinal: Soft, mild to moderate distention consistent with ascites, dull percussion, nontender. Musculoskeletal: Nontender with normal range of motion in all extremities. Mild lumbar tenderness palpation, no cervical  tenderness, mild mid T-spine tenderness. No deformity noted. Neurologic:  Normal speech and language. No gross focal neurologic deficits  Skin:  Skin is warm, dry and intact.  Psychiatric: Mood and affect are normal.   ____________________________________________    EKG  EKG reviewed and interpreted by myself shows sinus tachycardia 122 bpm, narrow QRS, left axis deviation, largely normal intervals with nonspecific ST changes. No ST elevation.  ____________________________________________    RADIOLOGY  No acute abnormality on CT  ____________________________________________   INITIAL IMPRESSION / ASSESSMENT AND PLAN / ED COURSE  Pertinent labs & imaging results that were available during my care of the patient were reviewed by me and considered in my medical decision making (see chart for details).  Patient presents to the emergency department after a fall last night  they prolonged period on the floor. We will check a CT scan of the head is a patient is not sure if he hit his head or not. We'll obtain x-rays of the back to further evaluate although on exam he has minimal discomfort with lumbar outpatient. Patient has a nontender abdomen but it is consistent with ascites. Given the patient's prolonged downtime we will obtain a creatinine kinase in addition to basic labs. We will IV hydrate well awaiting results.  Labs are largely within normal limits including CK. We will continue to IV hydrate and likely discharge with PCP follow-up.  ____________________________________________   FINAL CLINICAL IMPRESSION(S) / ED DIAGNOSES  Fall Dehydration    Minna Antis, MD 06/06/16 2308

## 2016-06-06 NOTE — ED Notes (Signed)
Patient transported to CT 

## 2016-06-06 NOTE — ED Notes (Signed)
Patient not in room at this time. Will assess once patient is in room. 

## 2016-06-07 ENCOUNTER — Ambulatory Visit: Admission: RE | Admit: 2016-06-07 | Payer: Medicare HMO | Source: Ambulatory Visit

## 2016-06-07 ENCOUNTER — Ambulatory Visit: Payer: Medicare HMO

## 2016-06-07 MED ORDER — TRAMADOL HCL 50 MG PO TABS: 100.0000 mg | ORAL_TABLET | Freq: Once | ORAL | Status: DC

## 2016-06-07 MED ORDER — TRAMADOL HCL 50 MG PO TABS
ORAL_TABLET | ORAL | Status: AC
  Filled 2016-06-07: qty 2

## 2016-06-08 DIAGNOSIS — I10 Essential (primary) hypertension: Secondary | ICD-10-CM | POA: Diagnosis not present

## 2016-06-08 DIAGNOSIS — K7031 Alcoholic cirrhosis of liver with ascites: Secondary | ICD-10-CM | POA: Diagnosis not present

## 2016-06-08 DIAGNOSIS — F39 Unspecified mood [affective] disorder: Secondary | ICD-10-CM | POA: Diagnosis not present

## 2016-06-08 NOTE — Discharge Instructions (Signed)
Paracentesis, Care After  Refer to this sheet in the next few weeks. These instructions provide you with information about caring for yourself after your procedure. Your health care provider may also give you more specific instructions. Your treatment has been planned according to current medical practices, but problems sometimes occur. Call your health care provider if you have any problems or questions after your procedure.  What can I expect after the procedure?  After your procedure, it is common to have a small amount of clear fluid coming from the puncture site.  Follow these instructions at home:  · Return to your normal activities as told by your health care provider. Ask your health care provider what activities are safe for you.  · Take over-the-counter and prescription medicines only as told by your health care provider.  · Do not take baths, swim, or use a hot tub until your health care provider approves.  · Follow instructions from your health care provider about:  ? How to take care of your puncture site.  ? When and how you should change your bandage (dressing).  ? When you should remove your dressing.  · Check your puncture area every day signs of infection. Watch for:  ? Redness, swelling, or pain.  ? Fluid, blood, or pus.  · Keep all follow-up visits as told by your health care provider. This is important.  Contact a health care provider if:  · You have redness, swelling, or pain at your puncture site.  · You start to have more clear fluid coming from your puncture site.  · You have blood or pus coming from your puncture site.  · You have chills.  · You have a fever.  Get help right away if:  · You develop chest pain or shortness of breath.  · You develop increasing pain, discomfort, or swelling in your abdomen.  · You feel dizzy or light-headed or you pass out.  This information is not intended to replace advice given to you by your health care provider. Make sure you discuss any questions you  have with your health care provider.  Document Released: 06/11/2014 Document Revised: 07/03/2015 Document Reviewed: 04/09/2014  Elsevier Interactive Patient Education © 2017 [employer]

## 2016-06-11 ENCOUNTER — Other Ambulatory Visit: Payer: Self-pay | Admitting: Internal Medicine

## 2016-06-11 ENCOUNTER — Ambulatory Visit
Admission: RE | Admit: 2016-06-11 | Discharge: 2016-06-11 | Disposition: A | Discharge status: Home / Self Care | Payer: Medicare HMO | Source: Ambulatory Visit | Attending: Unknown Physician Specialty | Admitting: Unknown Physician Specialty

## 2016-06-11 DIAGNOSIS — K7031 Alcoholic cirrhosis of liver with ascites: Secondary | ICD-10-CM | POA: Insufficient documentation

## 2016-06-11 MED ORDER — ALBUMIN HUMAN 25 % IV SOLN
25.0000 g | Freq: Once | INTRAVENOUS | Status: AC
  Administered 2016-06-11: 25 g via INTRAVENOUS
  Filled 2016-06-11: qty 100

## 2016-06-11 NOTE — Procedures (Signed)
US guided paracentesis.  Removed 4 liters.  Minimal blood loss and no immediate complication.  See full report in Imaging.

## 2016-06-14 ENCOUNTER — Ambulatory Visit: Admission: RE | Admit: 2016-06-14 | Payer: Medicare HMO | Source: Ambulatory Visit

## 2016-06-14 ENCOUNTER — Ambulatory Visit: Payer: Medicare HMO

## 2016-06-18 ENCOUNTER — Observation Stay
Admission: EM | Admit: 2016-06-18 | Discharge: 2016-06-19 | Disposition: A | Discharge status: Home / Self Care | Payer: Medicare HMO | Attending: Internal Medicine | Admitting: Internal Medicine

## 2016-06-18 ENCOUNTER — Ambulatory Visit
Admission: RE | Admit: 2016-06-18 | Discharge: 2016-06-18 | Disposition: A | Discharge status: Home / Self Care | Payer: Medicare HMO | Source: Ambulatory Visit

## 2016-06-18 ENCOUNTER — Encounter: Payer: Self-pay | Admitting: Emergency Medicine

## 2016-06-18 ENCOUNTER — Emergency Department: Payer: Medicare HMO

## 2016-06-18 DIAGNOSIS — K7030 Alcoholic cirrhosis of liver without ascites: Secondary | ICD-10-CM | POA: Diagnosis not present

## 2016-06-18 DIAGNOSIS — M549 Dorsalgia, unspecified: Secondary | ICD-10-CM | POA: Diagnosis not present

## 2016-06-18 DIAGNOSIS — K7290 Hepatic failure, unspecified without coma: Secondary | ICD-10-CM | POA: Diagnosis not present

## 2016-06-18 DIAGNOSIS — K7031 Alcoholic cirrhosis of liver with ascites: Secondary | ICD-10-CM | POA: Diagnosis not present

## 2016-06-18 DIAGNOSIS — R2681 Unsteadiness on feet: Secondary | ICD-10-CM | POA: Insufficient documentation

## 2016-06-18 DIAGNOSIS — W19XXXA Unspecified fall, initial encounter: Secondary | ICD-10-CM | POA: Insufficient documentation

## 2016-06-18 DIAGNOSIS — Z9114 Patient's other noncompliance with medication regimen: Secondary | ICD-10-CM | POA: Diagnosis not present

## 2016-06-18 DIAGNOSIS — R4182 Altered mental status, unspecified: Secondary | ICD-10-CM | POA: Diagnosis not present

## 2016-06-18 DIAGNOSIS — G8929 Other chronic pain: Secondary | ICD-10-CM | POA: Insufficient documentation

## 2016-06-18 DIAGNOSIS — Z96642 Presence of left artificial hip joint: Secondary | ICD-10-CM | POA: Insufficient documentation

## 2016-06-18 DIAGNOSIS — R188 Other ascites: Secondary | ICD-10-CM | POA: Diagnosis not present

## 2016-06-18 DIAGNOSIS — Z9119 Patient's noncompliance with other medical treatment and regimen: Secondary | ICD-10-CM | POA: Insufficient documentation

## 2016-06-18 DIAGNOSIS — G4730 Sleep apnea, unspecified: Secondary | ICD-10-CM | POA: Diagnosis not present

## 2016-06-18 DIAGNOSIS — M109 Gout, unspecified: Secondary | ICD-10-CM | POA: Diagnosis not present

## 2016-06-18 DIAGNOSIS — K7200 Acute and subacute hepatic failure without coma: Secondary | ICD-10-CM | POA: Diagnosis not present

## 2016-06-18 DIAGNOSIS — Z79899 Other long term (current) drug therapy: Secondary | ICD-10-CM | POA: Diagnosis not present

## 2016-06-18 DIAGNOSIS — Z66 Do not resuscitate: Secondary | ICD-10-CM | POA: Diagnosis not present

## 2016-06-18 DIAGNOSIS — M1712 Unilateral primary osteoarthritis, left knee: Secondary | ICD-10-CM | POA: Insufficient documentation

## 2016-06-18 DIAGNOSIS — R531 Weakness: Secondary | ICD-10-CM | POA: Insufficient documentation

## 2016-06-18 DIAGNOSIS — F329 Major depressive disorder, single episode, unspecified: Secondary | ICD-10-CM | POA: Insufficient documentation

## 2016-06-18 DIAGNOSIS — I10 Essential (primary) hypertension: Secondary | ICD-10-CM | POA: Diagnosis not present

## 2016-06-18 DIAGNOSIS — R410 Disorientation, unspecified: Secondary | ICD-10-CM

## 2016-06-18 DIAGNOSIS — Y92009 Unspecified place in unspecified non-institutional (private) residence as the place of occurrence of the external cause: Secondary | ICD-10-CM

## 2016-06-18 DIAGNOSIS — G47419 Narcolepsy without cataplexy: Secondary | ICD-10-CM | POA: Diagnosis not present

## 2016-06-18 DIAGNOSIS — E7800 Pure hypercholesterolemia, unspecified: Secondary | ICD-10-CM | POA: Insufficient documentation

## 2016-06-18 DIAGNOSIS — K7682 Hepatic encephalopathy: Secondary | ICD-10-CM

## 2016-06-18 LAB — COMPREHENSIVE METABOLIC PANEL
ALT: 13 U/L — AB (ref 17–63)
ANION GAP: 9 (ref 5–15)
AST: 73 U/L — AB (ref 15–41)
Albumin: 3.1 g/dL — ABNORMAL LOW (ref 3.5–5.0)
Alkaline Phosphatase: 78 U/L (ref 38–126)
BILIRUBIN TOTAL: 1.6 mg/dL — AB (ref 0.3–1.2)
BUN: 10 mg/dL (ref 6–20)
CALCIUM: 8 mg/dL — AB (ref 8.9–10.3)
CO2: 23 mmol/L (ref 22–32)
Chloride: 104 mmol/L (ref 101–111)
Creatinine, Ser: 1.05 mg/dL (ref 0.61–1.24)
GFR calc Af Amer: >60 mL/min (ref >60)
GLUCOSE: 106 mg/dL — AB (ref 65–99)
Potassium: 4.4 mmol/L (ref 3.5–5.1)
SODIUM: 136 mmol/L (ref 135–145)
TOTAL PROTEIN: 6.8 g/dL (ref 6.5–8.1)

## 2016-06-18 LAB — CBC
HCT: 32 % — ABNORMAL LOW (ref 40.0–52.0)
HEMOGLOBIN: 10.7 g/dL — AB (ref 13.0–18.0)
MCH: 30.4 pg (ref 26.0–34.0)
MCHC: 33.4 g/dL (ref 32.0–36.0)
MCV: 90.8 fL (ref 80.0–100.0)
Platelets: 104 10*3/uL — ABNORMAL LOW (ref 150–440)
RBC: 3.52 MIL/uL — AB (ref 4.40–5.90)
RDW: 19.6 % — ABNORMAL HIGH (ref 11.5–14.5)
WBC: 5.4 10*3/uL (ref 3.8–10.6)

## 2016-06-18 LAB — AMMONIA: AMMONIA: 48 umol/L — AB (ref 9–35)

## 2016-06-18 MED ORDER — SPIRONOLACTONE 25 MG PO TABS
25.0000 mg | ORAL_TABLET | Freq: Every day | ORAL | Status: DC
  Administered 2016-06-19: 25 mg via ORAL
  Filled 2016-06-18: qty 1

## 2016-06-18 MED ORDER — OXYCODONE HCL 5 MG PO TABS
5.0000 mg | ORAL_TABLET | Freq: Four times a day (QID) | ORAL | Status: DC | PRN
  Administered 2016-06-19: 04:00:00 5 mg via ORAL
  Filled 2016-06-18: qty 1

## 2016-06-18 MED ORDER — LACTULOSE 10 GM/15ML PO SOLN: 5.0000 g | Freq: Two times a day (BID) | ORAL | Status: DC

## 2016-06-18 MED ORDER — TRAMADOL HCL 50 MG PO TABS: 50.0000 mg | ORAL_TABLET | Freq: Four times a day (QID) | ORAL | Status: DC | PRN

## 2016-06-18 MED ORDER — LACTULOSE 10 GM/15ML PO SOLN
5.0000 g | Freq: Two times a day (BID) | ORAL | Status: DC
  Administered 2016-06-19: 5 g via ORAL
  Filled 2016-06-18: qty 30

## 2016-06-18 MED ORDER — PANTOPRAZOLE SODIUM 40 MG PO TBEC
40.0000 mg | DELAYED_RELEASE_TABLET | Freq: Two times a day (BID) | ORAL | Status: DC
  Administered 2016-06-18 – 2016-06-19 (×2): 40 mg via ORAL
  Filled 2016-06-18 (×2): qty 1

## 2016-06-18 MED ORDER — LACTULOSE 10 GM/15ML PO SOLN
20.0000 g | Freq: Once | ORAL | Status: AC
  Administered 2016-06-18: 20 g via ORAL
  Filled 2016-06-18: qty 30

## 2016-06-18 MED ORDER — ONDANSETRON HCL 4 MG PO TABS: 4.0000 mg | ORAL_TABLET | Freq: Three times a day (TID) | ORAL | Status: DC | PRN

## 2016-06-18 MED ORDER — ATORVASTATIN CALCIUM 20 MG PO TABS
10.0000 mg | ORAL_TABLET | Freq: Every day | ORAL | Status: DC
  Administered 2016-06-18: 10 mg via ORAL
  Filled 2016-06-18: qty 1

## 2016-06-18 MED ORDER — FUROSEMIDE 40 MG PO TABS
40.0000 mg | ORAL_TABLET | Freq: Every day | ORAL | Status: DC
  Administered 2016-06-19: 10:00:00 40 mg via ORAL
  Filled 2016-06-18: qty 1

## 2016-06-18 MED ORDER — CYCLOBENZAPRINE HCL 10 MG PO TABS: 5.0000 mg | ORAL_TABLET | Freq: Three times a day (TID) | ORAL | Status: DC | PRN

## 2016-06-18 MED ORDER — LOSARTAN POTASSIUM 50 MG PO TABS
50.0000 mg | ORAL_TABLET | Freq: Every day | ORAL | Status: DC
  Administered 2016-06-19: 10:00:00 50 mg via ORAL
  Filled 2016-06-18: qty 1

## 2016-06-18 MED ORDER — DOCUSATE SODIUM 100 MG PO CAPS: 100.0000 mg | ORAL_CAPSULE | Freq: Two times a day (BID) | ORAL | Status: DC | PRN

## 2016-06-18 MED ORDER — ALBUMIN HUMAN 25 % IV SOLN
25.0000 g | Freq: Once | INTRAVENOUS | Status: AC
  Administered 2016-06-18: 25 g via INTRAVENOUS
  Filled 2016-06-18: qty 100

## 2016-06-18 MED ORDER — HYDROCODONE-ACETAMINOPHEN 5-325 MG PO TABS
1.0000 | ORAL_TABLET | Freq: Once | ORAL | Status: AC
  Administered 2016-06-18: 1 via ORAL
  Filled 2016-06-18: qty 1

## 2016-06-18 NOTE — ED Notes (Signed)
HR 81 BP 118/76 lying HR 82 BP 135/91 sitting HR 93 BP 129/91 standing

## 2016-06-18 NOTE — ED Notes (Signed)
Accepting nurse on 1C unable to take report at this time. Will call back.

## 2016-06-18 NOTE — ED Notes (Signed)
Report to Casey, RN.

## 2016-06-18 NOTE — OR Nursing (Signed)
Pt showing confusion in aspect of repeating story several times. He misses words and changes story mid sentences. His sister expressed concern to Ultrasound staff that he is more confused than usual but she cant get him to see MD or go to ER. I discussed my concern about his confusion with pt and he verbalized concern about continued pain in his back post fall at home within last week or two. He repeated this story 4 times in ten minutes. He verbalized feeling weaker this week than before. He has agreed to go to ER post paracentesis. NS lock started, then Albumen 25 G started after paracentesis

## 2016-06-18 NOTE — H&P (Signed)
[employer] [facility] [place] at [facility]   PATIENT NAME: Lawrence West    MR#:  [id number]  DATE OF BIRTH:  07/09/1945  DATE OF ADMISSION:  06/18/2016  PRIMARY CARE PHYSICIAN: Kandyce Rud, MD   REQUESTING/REFERRING PHYSICIAN: Cyril Loosen  CHIEF COMPLAINT:   Chief Complaint  Patient presents with  . Altered Mental Status    HISTORY OF PRESENT ILLNESS: Lawrence West  is a 71 y.o. male with a known history of Alcoholic liver cirrhosis, depression, high cholesterol, hypertension, sleep apnea, arthritis- lives alone at home with a caretaker, had a fall 2 weeks ago and came to ER and found no acute injuries. Since then he continues to have pain in his back and having some more imbalance. He is also concerned about his regular use of lactulose and diuretics. He has 8-10 bowel movements every day and he has to visit bathroom frequently for urination also. That keeps him from getting good sleep at night and getting his other social activities. Concerned with this for last 3-4 days he did not take any lactulose. He was noted slightly confused by his caretaker and he also had complain of this pain, since he fell down so he decided to bring him to emergency room. Patient is more oriented now as per the caretaker who is present in the room, ER physician suggested to admit for hepatitic encephalopathy. 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 Mechele Collin in office, and he is scheduled to have paracentesis every week as outpatient which is done today.  PAST MEDICAL HISTORY:   Past Medical History:  Diagnosis Date  . Arthritis    "left knee" (07/08/2015)  . Ascites   . Avascular necrosis of bone of left hip (HCC) 07/08/2015  . Brown recluse spider bite 1990s   "they thought I was gonna die then"  . Cirrhosis of liver (HCC)    Sees Dr. Sung Amabile  . Depression   . Edema of both legs    Takes Lasix  . Gout X 1  . High cholesterol   . Hypertension   .  Narcolepsy   . Rheumatic fever   . Sleep apnea    pt stated "I have sleep apnea but I do not use the machine...Marland KitchenMarland KitchenMarland Kitchenit broke" (07/08/2015)    PAST SURGICAL HISTORY: Past Surgical History:  Procedure Laterality Date  . COSMETIC SURGERY  ~ 1997   "nose done, lip drop, chin lift"  . ESOPHAGOGASTRODUODENOSCOPY N/A 12/27/2015   Procedure: ESOPHAGOGASTRODUODENOSCOPY (EGD);  Surgeon: Charlott Rakes, MD;  Location: [facility] ENDOSCOPY;  Service: Endoscopy;  Laterality: N/A;  . FOOT FRACTURE SURGERY Right   . FRACTURE SURGERY    . INGUINAL HERNIA REPAIR Bilateral 1990s  . TONSILLECTOMY  1954  . TOTAL HIP ARTHROPLASTY Left 07/08/2015  . TOTAL HIP ARTHROPLASTY Left 07/08/2015   Procedure: LEFT TOTAL HIP ARTHROPLASTY;  Surgeon: Teryl Lucy, MD;  Location: [facility] OR;  Service: Orthopedics;  Laterality: Left;  . TOTAL KNEE ARTHROPLASTY Left 03/2008  . US GUIDED PARACENTESIS [facility] HX)  April 2017    SOCIAL HISTORY:  Social History  Substance Use Topics  . Smoking status: Never Smoker  . Smokeless tobacco: Never Used  . Alcohol use 0.0 oz/week     Comment: 07/08/2015 former Heavy ETOH abuse.; "haven't had a drink in 2017; AA told me I didn't need to come back"    FAMILY HISTORY:  Family History  Problem Relation Age of Onset  . Melanoma Father   .  Lung cancer Father     DRUG ALLERGIES: No Known Allergies  REVIEW OF SYSTEMS:   CONSTITUTIONAL: No fever, fatigue or weakness.  EYES: No blurred or double vision.  EARS, NOSE, AND THROAT: No tinnitus or ear pain.  RESPIRATORY: No cough, shortness of breath, wheezing or hemoptysis.  CARDIOVASCULAR: No chest pain, orthopnea, edema.  GASTROINTESTINAL: No nausea, vomiting,  abdominal pain. Have recurrent loose stools at home. GENITOURINARY: No dysuria, hematuria.  ENDOCRINE: No polyuria, nocturia,  HEMATOLOGY: No anemia, easy bruising or bleeding SKIN: No rash or lesion. MUSCULOSKELETAL: No joint pain or arthritis.   NEUROLOGIC: No tingling, numbness,  weakness.  PSYCHIATRY: No anxiety or depression.   MEDICATIONS AT HOME:  Prior to Admission medications   Medication Sig Start Date End Date Taking? Authorizing Provider  atorvastatin (LIPITOR) 10 MG tablet Take 1 tablet (10 mg total) by mouth daily. 05/20/15   Enid BaasKalisetti, Radhika, MD  bismuth subsalicylate (PEPTO BISMOL) 262 MG/15ML suspension Take 30 mLs by mouth every 6 (six) hours as needed.    [provider]  furosemide (LASIX) 40 MG tablet Take 1 tablet (40 mg total) by mouth daily. 03/15/16   Emily FilbertWilliams, Jonathan E, MD  lactulose (CHRONULAC) 10 GM/15ML solution Take 5 g by mouth 2 (two) times daily.    [provider]  losartan (COZAAR) 50 MG tablet Take 1 tablet by mouth daily. 09/02/15 09/01/16  [provider]  ondansetron (ZOFRAN) 4 MG tablet Take 1 tablet (4 mg total) by mouth every 8 (eight) hours as needed for nausea or vomiting. 07/08/15   Teryl LucyLandau, Joshua, MD  pantoprazole (PROTONIX) 40 MG tablet Take 1 tablet (40 mg total) by mouth 2 (two) times daily. 12/30/15   Auburn BilberryPatel, Shreyang, MD  spironolactone (ALDACTONE) 25 MG tablet Take 1 tablet (25 mg total) by mouth daily. 12/30/15   Auburn BilberryPatel, Shreyang, MD  traMADol (ULTRAM) 50 MG tablet Take 1 tablet (50 mg total) by mouth every 6 (six) hours as needed for severe pain. 12/31/15 12/30/16  Phineas SemenGoodman, Graydon, MD      PHYSICAL EXAMINATION:   VITAL SIGNS: Blood pressure (!) 124/97, pulse 83, temperature 98 F (36.7 C), temperature source Oral, resp. rate 14, height 5\' 10"  (1.778 m), weight 89 kg (196 lb 4.8 oz), SpO2 96 %.  GENERAL:  71 y.o.-year-old  Thin patient lying in the bed with no acute distress.  EYES: Pupils equal, round, reactive to light and accommodation. No scleral icterus. Extraocular muscles intact.  HEENT: Head atraumatic, normocephalic. Oropharynx and nasopharynx clear.  NECK:  Supple, no jugular venous distention. No thyroid enlargement, no tenderness.  LUNGS: Normal breath sounds bilaterally, no  wheezing, rales,rhonchi or crepitation. No use of accessory muscles of respiration.  CARDIOVASCULAR: S1, S2 normal. No murmurs, rubs, or gallops.  ABDOMEN: Soft, nontender, distended. Bowel sounds present. No organomegaly or mass. No tenderness on local palpation on vertebral and paravertebral area as on his back on thoracic and lumbar regions. EXTREMITIES: No pedal edema, cyanosis, or clubbing.  NEUROLOGIC: Cranial nerves II through XII are intact. Muscle strength 5/5 in all extremities. Sensation intact. Gait not checked. Some shaking. PSYCHIATRIC: The patient is alert and oriented x 3. He appears anxious. SKIN: No obvious rash, lesion, or ulcer.   LABORATORY PANEL:   CBC  Recent Labs Lab 06/18/16 1616  WBC 5.4  HGB 10.7*  HCT 32.0*  PLT 104*  MCV 90.8  MCH 30.4  MCHC 33.4  RDW 19.6*   ------------------------------------------------------------------------------------------------------------------  Chemistries   Recent Labs Lab 06/18/16 1616  NA 136  K 4.4  CL 104  CO2 23  GLUCOSE 106*  BUN 10  CREATININE 1.05  CALCIUM 8.0*  AST 73*  ALT 13*  ALKPHOS 78  BILITOT 1.6*   ------------------------------------------------------------------------------------------------------------------ estimated creatinine clearance is 74.6 mL/min (by C-G formula based on SCr of 1.05 mg/dL). ------------------------------------------------------------------------------------------------------------------ No results for input(s): TSH, T4TOTAL, T3FREE, THYROIDAB in the last 72 hours.  Invalid input(s): FREET3   Coagulation profile No results for input(s): INR, PROTIME in the last 168 hours. ------------------------------------------------------------------------------------------------------------------- No results for input(s): DDIMER in the last 72  hours. -------------------------------------------------------------------------------------------------------------------  Cardiac Enzymes No results for input(s): CKMB, TROPONINI, MYOGLOBIN in the last 168 hours.  Invalid input(s): CK ------------------------------------------------------------------------------------------------------------------ Invalid input(s): POCBNP  ---------------------------------------------------------------------------------------------------------------  Urinalysis    Component Value Date/Time   COLORURINE STRAW (A) 12/31/2015 1403   APPEARANCEUR CLEAR (A) 12/31/2015 1403   APPEARANCEUR Clear 10/23/2013 1637   LABSPEC 1.005 12/31/2015 1403   LABSPEC 1.010 10/23/2013 1637   PHURINE 7.0 12/31/2015 1403   GLUCOSEU NEGATIVE 12/31/2015 1403   GLUCOSEU Negative 10/23/2013 1637   HGBUR NEGATIVE 12/31/2015 1403   BILIRUBINUR NEGATIVE 12/31/2015 1403   BILIRUBINUR Negative 10/23/2013 1637   KETONESUR NEGATIVE 12/31/2015 1403   PROTEINUR NEGATIVE 12/31/2015 1403   UROBILINOGEN 1.0 03/05/2008 1447   NITRITE NEGATIVE 12/31/2015 1403   LEUKOCYTESUR NEGATIVE 12/31/2015 1403   LEUKOCYTESUR Negative 10/23/2013 1637     RADIOLOGY: Dg Chest 1 View  Result Date: 06/18/2016 CLINICAL DATA:  Altered mental status.  Paracentesis today. EXAM: CHEST 1 VIEW COMPARISON:  12/31/2015 FINDINGS: Lungs are adequately inflated without focal consolidation or effusion. Cardiomediastinal silhouette is within normal. There is calcified plaque over the aortic arch. Mild degenerate change of the spine. IMPRESSION: No acute cardiopulmonary disease. Aortic atherosclerosis. Electronically Signed   By: Elberta Fortis M.D.   On: 06/18/2016 16:57   US Paracentesis  Result Date: 06/18/2016 INDICATION: Cirrhosis and ascites. EXAM: ULTRASOUND GUIDED PARACENTESIS MEDICATIONS: None. COMPLICATIONS: None immediate. PROCEDURE: Informed written consent was obtained from the patient after a  discussion of the risks, benefits and alternatives to treatment. A timeout was performed prior to the initiation of the procedure. Initial ultrasound was performed to localize ascites. The right lower abdomen was prepped and draped in the usual sterile fashion. 1% lidocaine was used for local anesthesia. Following this, a 6 Fr Safe-T-Centesis catheter was introduced. An ultrasound image was saved for documentation purposes. The paracentesis was performed. The catheter was removed and a dressing was applied. The patient tolerated the procedure well without immediate post procedural complication. FINDINGS: A total of approximately 3.5 L of ascites was removed. IMPRESSION: Successful ultrasound-guided paracentesis yielding 3.5 liters of peritoneal fluid. Electronically Signed   By: Irish Lack M.D.   On: 06/18/2016 15:58    EKG: Orders placed or performed during the hospital encounter of 06/06/16  . ED EKG  . ED EKG  . EKG 12-Lead  . EKG 12-Lead  . EKG    IMPRESSION AND PLAN: * Hepatic encephalopathy   Counseled to be compliant with lactulose and discuss with his GI doctor to cut down the dose if he has ongoing diarrhea.    * Back pain since a fall.   Pain since fall 2 weeks ago, trauma workup was negative including x-rays on his vertebra and CT head.   I will give her muscle relaxer and oxycodone to help with the pain.   I will also get physical therapy evaluation.   Get urinalysis, ordered but still need to be  collected.  * Hypertension   Continue home medications, we'll check orthostatic vital signs.  * Hyperlipidemia   Continue statin.   All the records are reviewed and case discussed with ED provider. Management plans discussed with the patient, family and they are in agreement.  CODE STATUS: DO NOT RESUSCITATE Code Status History    Date Active Date Inactive Code Status Order ID Comments User Context   12/27/2015  3:37 PM 12/30/2015 10:03 PM Full Code [id number]  Adrian Saran,  MD Inpatient   05/19/2015  7:43 PM 05/20/2015  6:19 PM Full Code [id number]  Houston Siren, MD Inpatient    Advance Directive Documentation     Most Recent Value  Type of Advance Directive  Healthcare Power of Attorney, Living will  Pre-existing out of facility DNR order (yellow form or pink MOST form)  -  "MOST" Form in Place?  -     Patient confirmed his wishes in presence of his power of attorney and very good friend, who is also his caretaker.  TOTAL TIME TAKING CARE OF THIS PATIENT: 50 minutes.    Altamese Dilling M.D on 06/18/2016   Between 7am to 6pm - Pager - [phone]  After 6pm go to [email or web address] - password [email or web address]  [facility]  Office  [phone]  CC: Primary care physician; Kandyce Rud, MD   Note: This dictation was prepared with Dragon dictation along with smaller phrase technology. Any transcriptional errors that result from this process are unintentional.

## 2016-06-18 NOTE — ED Notes (Signed)
Pt transport to 110 

## 2016-06-18 NOTE — ED Notes (Signed)
Admitting MD at bedside.

## 2016-06-18 NOTE — OR Nursing (Signed)
Paracentesis completed, sent to ER from US with albumen infusing.

## 2016-06-18 NOTE — ED Triage Notes (Signed)
Pt to ED from US after having paracentesis today.  Per US staff patient has become more confused and family is concerned, states had similar conversation several times while in US.  Patient presents A&Ox4, speaking in complete and coherent sentences, and reports not feeling confused.

## 2016-06-18 NOTE — ED Provider Notes (Signed)
[facility] Emergency Department Provider Note   ____________________________________________    I have reviewed the triage vital signs and the nursing notes.   HISTORY  Chief Complaint Altered Mental Status     HPI Lawrence West is a 71 y.o. male Who presents with altered mental status per friends and family. Patient had paracentesis today for ascites. He has a history of liver cirrhosis. He reports he has not been taking his lactulose over the past several days because of the way it makes him feel. Friends report  He is quite confused And acting bizarrely. They report this is happened before because of his cirrhosis. No reports of fevers. No abdominal pain. No cough or shortness of breath   Past Medical History:  Diagnosis Date  . Arthritis    "left knee" (07/08/2015)  . Ascites   . Avascular necrosis of bone of left hip (HCC) 07/08/2015  . Brown recluse spider bite 1990s   "they thought I was gonna die then"  . Cirrhosis of liver (HCC)    Sees Dr. Sung Amabile  . Depression   . Edema of both legs    Takes Lasix  . Gout X 1  . High cholesterol   . Hypertension   . Narcolepsy   . Rheumatic fever   . Sleep apnea    pt stated "I have sleep apnea but I do not use the machine...Marland KitchenMarland KitchenMarland Kitchenit broke" (07/08/2015)    Patient Active Problem List   Diagnosis Date Noted  . GIB (gastrointestinal bleeding) 12/27/2015  . Hyperlipidemia 07/23/2015  . Depression 07/23/2015  . Avascular necrosis of bone of left hip (HCC) 07/08/2015  . S/P total hip arthroplasty 07/08/2015  . Ascites due to alcoholic cirrhosis (HCC) 05/19/2015    Past Surgical History:  Procedure Laterality Date  . COSMETIC SURGERY  ~ 1997   "nose done, lip drop, chin lift"  . ESOPHAGOGASTRODUODENOSCOPY N/A 12/27/2015   Procedure: ESOPHAGOGASTRODUODENOSCOPY (EGD);  Surgeon: Charlott Rakes, MD;  Location: [facility] ENDOSCOPY;  Service: Endoscopy;  Laterality: N/A;  . FOOT FRACTURE SURGERY  Right   . FRACTURE SURGERY    . INGUINAL HERNIA REPAIR Bilateral 1990s  . TONSILLECTOMY  1954  . TOTAL HIP ARTHROPLASTY Left 07/08/2015  . TOTAL HIP ARTHROPLASTY Left 07/08/2015   Procedure: LEFT TOTAL HIP ARTHROPLASTY;  Surgeon: Teryl Lucy, MD;  Location: [facility] OR;  Service: Orthopedics;  Laterality: Left;  . TOTAL KNEE ARTHROPLASTY Left 03/2008  . US GUIDED PARACENTESIS [facility] HX)  April 2017    Prior to Admission medications   Medication Sig Start Date End Date Taking? Authorizing Provider  atorvastatin (LIPITOR) 10 MG tablet Take 1 tablet (10 mg total) by mouth daily. 05/20/15   Enid Baas, MD  bismuth subsalicylate (PEPTO BISMOL) 262 MG/15ML suspension Take 30 mLs by mouth every 6 (six) hours as needed.    [provider]  furosemide (LASIX) 40 MG tablet Take 1 tablet (40 mg total) by mouth daily. 03/15/16   Emily Filbert, MD  lactulose (CHRONULAC) 10 GM/15ML solution Take 5 g by mouth 2 (two) times daily.    [provider]  losartan (COZAAR) 50 MG tablet Take 1 tablet by mouth daily. 09/02/15 09/01/16  [provider]  ondansetron (ZOFRAN) 4 MG tablet Take 1 tablet (4 mg total) by mouth every 8 (eight) hours as needed for nausea or vomiting. 07/08/15   Teryl Lucy, MD  pantoprazole (PROTONIX) 40 MG tablet Take 1 tablet (40 mg total) by mouth 2 (  two) times daily. 12/30/15   Auburn Bilberry, MD  spironolactone (ALDACTONE) 25 MG tablet Take 1 tablet (25 mg total) by mouth daily. 12/30/15   Auburn Bilberry, MD  traMADol (ULTRAM) 50 MG tablet Take 1 tablet (50 mg total) by mouth every 6 (six) hours as needed for severe pain. 12/31/15 12/30/16  Phineas Semen, MD     Allergies Patient has no known allergies.  Family History  Problem Relation Age of Onset  . Melanoma Father   . Lung cancer Father     Social History Social History  Substance Use Topics  . Smoking status: Never Smoker  . Smokeless tobacco: Never Used  . Alcohol use 0.0 oz/week       Comment: 07/08/2015 former Heavy ETOH abuse.; "haven't had a drink in 2017; AA told me I didn't need to come back"    Review of Systems  Constitutional: No fever/chills Eyes: No visual changes.  ENT: No sore throat. Cardiovascular: Denies chest pain. Respiratory: Denies shortness of breath. Gastrointestinal: No abdominal pain.  No nausea, no vomiting.   Genitourinary: Negative for dysuria. Musculoskeletal: patient does complain of chronic back pain Skin: Negative for rash. Neurological: Negative for headaches    ____________________________________________   PHYSICAL EXAM:  VITAL SIGNS: ED Triage Vitals  Enc Vitals Group     BP 06/18/16 1544 124/89     Pulse Rate 06/18/16 1544 75     Resp 06/18/16 1544 14     Temp 06/18/16 1544 98 F (36.7 C)     Temp Source 06/18/16 1544 Oral     SpO2 06/18/16 1544 97 %     Weight 06/18/16 1544 196 lb 4.8 oz (89 kg)     Height 06/18/16 1544 5\' 10"  (1.778 m)     Head Circumference --      Peak Flow --      Pain Score 06/18/16 1550 3     Pain Loc --      Pain Edu? --      Excl. in GC? --     Constitutional: Alert but disoriented. No acute distress. Tangential thoughts Eyes: Conjunctivae are normal.  Head: Atraumatic. Nose: No congestion/rhinnorhea. Mouth/Throat: Mucous membranes are moist.    Cardiovascular: Normal rate, regular rhythm. Grossly normal heart sounds.  Good peripheral circulation. Respiratory: Normal respiratory effort.  No retractions. Lungs CTAB. Gastrointestinal: Soft and nontender. No distention.  No CVA tenderness. Genitourinary: deferred Musculoskeletal: back: No vertebral tenderness to palpation, normal range of motion.  Warm and well perfused Neurologic:  Normal speech and language but tangential and has difficulty focusing.No gross focal neurologic deficits are appreciated.  Skin:  Skin is warm, dry and intact. No rash noted. Psychiatric: Mood and affect are normal.    ____________________________________________   LABS (all labs ordered are listed, but only abnormal results are displayed)  Labs Reviewed  CBC - Abnormal; Notable for the following:       Result Value   RBC 3.52 (*)    Hemoglobin 10.7 (*)    HCT 32.0 (*)    RDW 19.6 (*)    Platelets 104 (*)    All other components within normal limits  COMPREHENSIVE METABOLIC PANEL - Abnormal; Notable for the following:    Glucose, Bld 106 (*)    Calcium 8.0 (*)    Albumin 3.1 (*)    AST 73 (*)    ALT 13 (*)    Total Bilirubin 1.6 (*)    All other components within normal limits  AMMONIA -  Abnormal; Notable for the following:    Ammonia 48 (*)    All other components within normal limits  URINALYSIS, COMPLETE (UACMP) WITH MICROSCOPIC   ____________________________________________  EKG  None ____________________________________________  RADIOLOGY  Chest x-ray unremarkable ____________________________________________   PROCEDURES  Procedure(s) performed: No    Critical Care performed: No ____________________________________________   INITIAL IMPRESSION / ASSESSMENT AND PLAN / ED COURSE  Pertinent labs & imaging results that were available during my care of the patient were reviewed by me and considered in my medical decision making (see chart for details).  Patient with a history of liver cirrhosis off his lactulose for several days presents with confusion. Exam is consistent with hepatic encephalopathy. Ammonia is mildly elevated. No evidence of infection. We will give lactulose and admit for further management   ____________________________________________   FINAL CLINICAL IMPRESSION(S) / ED DIAGNOSES  Final diagnoses:  Acute hepatic encephalopathy      NEW MEDICATIONS STARTED DURING THIS VISIT:  New Prescriptions   No medications on file     Note:  This document was prepared using Dragon voice recognition software and may include unintentional dictation  errors.    Jene EveryKinner, Reia Viernes, MD 06/18/16 [phone]

## 2016-06-18 NOTE — ED Notes (Signed)
Albumin infusion complete, total amount given.

## 2016-06-19 DIAGNOSIS — R188 Other ascites: Secondary | ICD-10-CM | POA: Diagnosis not present

## 2016-06-19 DIAGNOSIS — M549 Dorsalgia, unspecified: Secondary | ICD-10-CM | POA: Diagnosis not present

## 2016-06-19 DIAGNOSIS — E785 Hyperlipidemia, unspecified: Secondary | ICD-10-CM | POA: Diagnosis not present

## 2016-06-19 DIAGNOSIS — K7030 Alcoholic cirrhosis of liver without ascites: Secondary | ICD-10-CM | POA: Diagnosis not present

## 2016-06-19 LAB — URINALYSIS, COMPLETE (UACMP) WITH MICROSCOPIC
Bacteria, UA: NONE SEEN
Bilirubin Urine: NEGATIVE
GLUCOSE, UA: NEGATIVE mg/dL
HGB URINE DIPSTICK: NEGATIVE
Ketones, ur: NEGATIVE mg/dL
LEUKOCYTES UA: NEGATIVE
NITRITE: NEGATIVE
PH: 5 (ref 5.0–8.0)
Protein, ur: 30 mg/dL — AB
SPECIFIC GRAVITY, URINE: 1.028 (ref 1.005–1.030)

## 2016-06-19 LAB — CBC
HEMATOCRIT: 31.6 % — AB (ref 40.0–52.0)
Hemoglobin: 10.6 g/dL — ABNORMAL LOW (ref 13.0–18.0)
MCH: 30.6 pg (ref 26.0–34.0)
MCHC: 33.7 g/dL (ref 32.0–36.0)
MCV: 91 fL (ref 80.0–100.0)
Platelets: 91 10*3/uL — ABNORMAL LOW (ref 150–440)
RBC: 3.48 MIL/uL — ABNORMAL LOW (ref 4.40–5.90)
RDW: 19.5 % — AB (ref 11.5–14.5)
WBC: 4.7 10*3/uL (ref 3.8–10.6)

## 2016-06-19 LAB — BASIC METABOLIC PANEL
Anion gap: 7 (ref 5–15)
BUN: 11 mg/dL (ref 6–20)
CHLORIDE: 105 mmol/L (ref 101–111)
CO2: 25 mmol/L (ref 22–32)
CREATININE: 0.93 mg/dL (ref 0.61–1.24)
Calcium: 8.2 mg/dL — ABNORMAL LOW (ref 8.9–10.3)
GFR calc Af Amer: >60 mL/min (ref >60)
GFR calc non Af Amer: >60 mL/min (ref >60)
GLUCOSE: 96 mg/dL (ref 65–99)
POTASSIUM: 3.4 mmol/L — AB (ref 3.5–5.1)
SODIUM: 137 mmol/L (ref 135–145)

## 2016-06-19 LAB — AMMONIA: Ammonia: 56 umol/L — ABNORMAL HIGH (ref 9–35)

## 2016-06-19 MED ORDER — RIFAXIMIN 550 MG PO TABS: 550.0000 mg | ORAL_TABLET | Freq: Two times a day (BID) | ORAL | 0 refills | Status: AC

## 2016-06-19 NOTE — Evaluation (Signed)
Physical Therapy Evaluation Patient Details Name: Lawrence West MRN: [id number] DOB: February 05, 1946 Today's Date: 06/19/2016   History of Present Illness  Patient is a 71 y/o male that presents with confusion, acute hepatic encephalopathy. Had paracentesis performed with 3.5L removed.   Clinical Impression  Patient seen for mobility evaluation s/p paracentesis. He sustained a fall last week at home, but this was due to him tripping on a wire he forgot about he reports. No other falls recently. In this session he is able to ambulate with and without SPC without loss of speed or increase in deviation with gait. He has no loss of balance and appears at his baseline. He does not require any further PT services based on this impression.     Follow Up Recommendations No PT follow up    Equipment Recommendations       Recommendations for Other Services       Precautions / Restrictions Precautions Precautions: None Restrictions Weight Bearing Restrictions: No      Mobility  Bed Mobility Overal bed mobility: Independent             General bed mobility comments: No deficits noted in bed mobility.   Transfers Overall transfer level: Independent               General transfer comment: No deficits observed, though did require use of UEs.   Ambulation/Gait Ambulation/Gait assistance: Supervision Ambulation Distance (Feet): 200 Feet Assistive device: Straight cane Gait Pattern/deviations: WFL(Within Functional Limits)   Gait velocity interpretation: at or above normal speed for age/gender General Gait Details: Patient demonstrates mild narrow base of support in ambulation, alternates between use and disuse of SPC, no loss of balance.   Stairs            Wheelchair Mobility    Modified Rankin (Stroke Patients Only)       Balance Overall balance assessment: Independent;No apparent balance deficits (not formally assessed)                                           Pertinent Vitals/Pain Pain Assessment: No/denies pain    Home Living Family/patient expects to be discharged to:: Private residence Living Arrangements: Alone Available Help at Discharge: Friend(s);Available PRN/intermittently Type of Home: House Home Access: Ramped entrance     Home Layout: Multi-level;Able to live on main level with bedroom/bathroom Home Equipment: Dan Humphreys - 2 wheels;Cane - single point      Prior Function Level of Independence: Independent with assistive device(s)         Comments: Pt using SPC in home; doesn't use any AD in community but will use motorized carts at stores.     Hand Dominance   Dominant Hand: Right    Extremity/Trunk Assessment   Upper Extremity Assessment Upper Extremity Assessment: Overall WFL for tasks assessed    Lower Extremity Assessment Lower Extremity Assessment: Overall WFL for tasks assessed       Communication   Communication: No difficulties  Cognition Arousal/Alertness: Awake/alert Behavior During Therapy: WFL for tasks assessed/performed Overall Cognitive Status: Within Functional Limits for tasks assessed                                        General Comments      Exercises  Assessment/Plan    PT Assessment Patent does not need any further PT services  PT Problem List         PT Treatment Interventions      PT Goals (Current goals can be found in the Care Plan section)  Acute Rehab PT Goals Patient Stated Goal: To return home  PT Goal Formulation: With patient Time For Goal Achievement: 07/03/16 Potential to Achieve Goals: Good    Frequency     Barriers to discharge        Co-evaluation               AM-PAC PT "6 Clicks" Daily Activity  Outcome Measure Difficulty turning over in bed (including adjusting bedclothes, sheets and blankets)?: None Difficulty moving from lying on back to sitting on the side of the bed? : None Difficulty sitting  down on and standing up from a chair with arms (e.g., wheelchair, bedside commode, etc,.)?: None Help needed moving to and from a bed to chair (including a wheelchair)?: None Help needed walking in hospital room?: None Help needed climbing 3-5 steps with a railing? : A Little 6 Click Score: 23    End of Session Equipment Utilized During Treatment: Gait belt Activity Tolerance: Patient tolerated treatment well Patient left: in bed;with call bell/phone within reach;with family/visitor present Nurse Communication: Mobility status PT Visit Diagnosis: Unsteadiness on feet (R26.81)    Time: 1353-1401 PT Time Calculation (min) (ACUTE ONLY): 8 min   Charges:   PT Evaluation $PT Eval Low Complexity: 1 Procedure     PT G Codes:   PT G-Codes **NOT FOR INPATIENT CLASS** Functional Assessment Tool Used: AM-PAC 6 Clicks Basic Mobility Functional Limitation: Mobility: Walking and moving around Mobility: Walking and Moving Around Current Status (Z6109(G8978): 0 percent impaired, limited or restricted Mobility: Walking and Moving Around Discharge Status (U0454(G8980): 0 percent impaired, limited or restricted   Alva GarnetPatrick McNamara PT, DPT, CSCS    06/19/2016, 5:36 PM

## 2016-06-19 NOTE — Care Management Note (Addendum)
Case Management Note  Patient Details  Name: Lawrence West MRN: [id number] Date of Birth: 02/13/1945  Subjective/Objective:   Discussed discharge planning with [name]. He has a friend and a caretaker who will be staying with him after discharge today. [name] chose [facility] for HH-PT. [facility]                  Action/Plan:   Expected Discharge Date:  06/19/16               Expected Discharge Plan:   06/18/16  In-House Referral:     Discharge planning Services     Post Acute Care Choice:   [facility] Choice offered to:   Patient  DME Arranged:   NA DME Agency:   NA  HH Arranged:   HH-PT HH Agency:   [facility]  Status of Service:   Completed discharged to home 06/18/16.   If discussed at Long Length of Stay Meetings, dates discussed:    Additional Comments:  Elton Heid A, RN 06/19/2016, 11:32 AM

## 2016-06-19 NOTE — Plan of Care (Signed)
Pt being d/ced home with home health and home PT.  Pt missed lactulose dose - States that it makes him nauseous and between lactulose and lasix, he's living in the bathroom.  Shared this with the dr.  Allen KellHe's been started on rifaximin. He was admitted for hepatic encephalopathy.  Ammonia level increased today from 48 to 56.  K+ dropped to 3.4 from 4.4.  Reported having some cramping in R hand.   IV removed.  Reviewed d/c instructions w/patient.  He's going to get picked up by a friend.

## 2016-06-19 NOTE — Care Management Note (Signed)
Case Management Note  Patient Details  Name: Lawrence West MRN: [id number] Date of Birth: 10/18/1945  Subjective/Objective:     Mr Lukes's POA's are his sister Alanson PulsCharlotte Pagent [phone] and Veleta Minersarren Moorehead [phone].   Sister requested to speak with Mr Elpidio AnisSudini about Mr Huckeby's prognosis. Call to Dr Elpidio AnisSudini requesting that he call sister Claris GowerCharlotte.            Action/Plan:   Expected Discharge Date:  06/19/16               Expected Discharge Plan:     In-House Referral:     Discharge planning Services     Post Acute Care Choice:    Choice offered to:     DME Arranged:    DME Agency:     HH Arranged:    HH Agency:     Status of Service:     If discussed at [employer] Meetings, dates discussed:    Additional Comments:  Taya Ashbaugh A, RN 06/19/2016, 2:17 PM

## 2016-06-19 NOTE — Discharge Instructions (Signed)
Resume diet and activity as before ° ° °

## 2016-06-21 ENCOUNTER — Ambulatory Visit: Payer: Medicare HMO

## 2016-06-23 ENCOUNTER — Encounter: Payer: Self-pay | Admitting: Emergency Medicine

## 2016-06-23 ENCOUNTER — Emergency Department: Payer: Medicare HMO

## 2016-06-23 ENCOUNTER — Inpatient Hospital Stay
Admission: EM | Admit: 2016-06-23 | Discharge: 2016-06-25 | DRG: 442 | Disposition: A | Discharge status: Home / Self Care | Payer: Medicare HMO | Attending: Internal Medicine | Admitting: Internal Medicine

## 2016-06-23 DIAGNOSIS — M549 Dorsalgia, unspecified: Secondary | ICD-10-CM | POA: Diagnosis not present

## 2016-06-23 DIAGNOSIS — R188 Other ascites: Secondary | ICD-10-CM

## 2016-06-23 DIAGNOSIS — M545 Low back pain: Secondary | ICD-10-CM | POA: Diagnosis not present

## 2016-06-23 DIAGNOSIS — K7290 Hepatic failure, unspecified without coma: Secondary | ICD-10-CM | POA: Diagnosis not present

## 2016-06-23 DIAGNOSIS — E785 Hyperlipidemia, unspecified: Secondary | ICD-10-CM | POA: Diagnosis present

## 2016-06-23 DIAGNOSIS — F1020 Alcohol dependence, uncomplicated: Secondary | ICD-10-CM | POA: Diagnosis not present

## 2016-06-23 DIAGNOSIS — Z9119 Patient's noncompliance with other medical treatment and regimen: Secondary | ICD-10-CM

## 2016-06-23 DIAGNOSIS — G47419 Narcolepsy without cataplexy: Secondary | ICD-10-CM | POA: Diagnosis not present

## 2016-06-23 DIAGNOSIS — I10 Essential (primary) hypertension: Secondary | ICD-10-CM | POA: Diagnosis not present

## 2016-06-23 DIAGNOSIS — Z79899 Other long term (current) drug therapy: Secondary | ICD-10-CM | POA: Diagnosis not present

## 2016-06-23 DIAGNOSIS — R45851 Suicidal ideations: Secondary | ICD-10-CM | POA: Diagnosis present

## 2016-06-23 DIAGNOSIS — M533 Sacrococcygeal disorders, not elsewhere classified: Secondary | ICD-10-CM | POA: Diagnosis not present

## 2016-06-23 DIAGNOSIS — K7031 Alcoholic cirrhosis of liver with ascites: Secondary | ICD-10-CM | POA: Diagnosis not present

## 2016-06-23 DIAGNOSIS — K7682 Hepatic encephalopathy: Secondary | ICD-10-CM | POA: Diagnosis present

## 2016-06-23 DIAGNOSIS — K7040 Alcoholic hepatic failure without coma: Secondary | ICD-10-CM | POA: Diagnosis not present

## 2016-06-23 DIAGNOSIS — Z96642 Presence of left artificial hip joint: Secondary | ICD-10-CM | POA: Diagnosis not present

## 2016-06-23 DIAGNOSIS — M109 Gout, unspecified: Secondary | ICD-10-CM | POA: Diagnosis not present

## 2016-06-23 DIAGNOSIS — K7210 Chronic hepatic failure without coma: Principal | ICD-10-CM | POA: Diagnosis present

## 2016-06-23 DIAGNOSIS — F329 Major depressive disorder, single episode, unspecified: Secondary | ICD-10-CM | POA: Diagnosis not present

## 2016-06-23 DIAGNOSIS — R296 Repeated falls: Secondary | ICD-10-CM | POA: Diagnosis not present

## 2016-06-23 DIAGNOSIS — K219 Gastro-esophageal reflux disease without esophagitis: Secondary | ICD-10-CM | POA: Diagnosis present

## 2016-06-23 DIAGNOSIS — Z96652 Presence of left artificial knee joint: Secondary | ICD-10-CM | POA: Diagnosis present

## 2016-06-23 LAB — COMPREHENSIVE METABOLIC PANEL
ALK PHOS: 93 U/L (ref 38–126)
ALT: 16 U/L — ABNORMAL LOW (ref 17–63)
AST: 62 U/L — ABNORMAL HIGH (ref 15–41)
Albumin: 3 g/dL — ABNORMAL LOW (ref 3.5–5.0)
Anion gap: 9 (ref 5–15)
BILIRUBIN TOTAL: 2.4 mg/dL — AB (ref 0.3–1.2)
BUN: 9 mg/dL (ref 6–20)
CALCIUM: 8.5 mg/dL — AB (ref 8.9–10.3)
CHLORIDE: 103 mmol/L (ref 101–111)
CO2: 22 mmol/L (ref 22–32)
CREATININE: 0.71 mg/dL (ref 0.61–1.24)
GFR calc non Af Amer: >60 mL/min (ref >60)
Glucose, Bld: 101 mg/dL — ABNORMAL HIGH (ref 65–99)
Potassium: 3.6 mmol/L (ref 3.5–5.1)
Sodium: 134 mmol/L — ABNORMAL LOW (ref 135–145)
TOTAL PROTEIN: 7.2 g/dL (ref 6.5–8.1)

## 2016-06-23 LAB — CBC
HEMATOCRIT: 35.7 % — AB (ref 40.0–52.0)
HEMOGLOBIN: 11.9 g/dL — AB (ref 13.0–18.0)
MCH: 29.8 pg (ref 26.0–34.0)
MCHC: 33.3 g/dL (ref 32.0–36.0)
MCV: 89.5 fL (ref 80.0–100.0)
Platelets: 144 10*3/uL — ABNORMAL LOW (ref 150–440)
RBC: 3.99 MIL/uL — ABNORMAL LOW (ref 4.40–5.90)
RDW: 18.8 % — AB (ref 11.5–14.5)
WBC: 7.4 10*3/uL (ref 3.8–10.6)

## 2016-06-23 LAB — URINALYSIS, COMPLETE (UACMP) WITH MICROSCOPIC
Bacteria, UA: NONE SEEN
Bilirubin Urine: NEGATIVE
Glucose, UA: NEGATIVE mg/dL
Hgb urine dipstick: NEGATIVE
KETONES UR: NEGATIVE mg/dL
Leukocytes, UA: NEGATIVE
Nitrite: NEGATIVE
PH: 6 (ref 5.0–8.0)
PROTEIN: NEGATIVE mg/dL
Specific Gravity, Urine: 1.002 — ABNORMAL LOW (ref 1.005–1.030)
Squamous Epithelial / LPF: NONE SEEN
WBC, UA: NONE SEEN WBC/hpf (ref 0–5)

## 2016-06-23 LAB — ETHANOL: ALCOHOL ETHYL (B): 170 mg/dL — AB (ref <5)

## 2016-06-23 LAB — PROTIME-INR
INR: 1.41
PROTHROMBIN TIME: 17.4 s — AB (ref 11.4–15.2)

## 2016-06-23 LAB — AMMONIA: AMMONIA: 50 umol/L — AB (ref 9–35)

## 2016-06-23 MED ORDER — LACTULOSE 10 GM/15ML PO SOLN
10.0000 g | Freq: Once | ORAL | Status: AC
  Administered 2016-06-23: 10 g via ORAL
  Filled 2016-06-23: qty 30

## 2016-06-23 MED ORDER — PANTOPRAZOLE SODIUM 40 MG PO TBEC
40.0000 mg | DELAYED_RELEASE_TABLET | Freq: Two times a day (BID) | ORAL | Status: DC
  Administered 2016-06-24 – 2016-06-25 (×3): 40 mg via ORAL
  Filled 2016-06-23 (×3): qty 1

## 2016-06-23 MED ORDER — LACTULOSE 10 GM/15ML PO SOLN
30.0000 g | Freq: Two times a day (BID) | ORAL | Status: DC
  Administered 2016-06-24 – 2016-06-25 (×3): 30 g via ORAL
  Filled 2016-06-23 (×3): qty 60

## 2016-06-23 MED ORDER — SPIRONOLACTONE 25 MG PO TABS
25.0000 mg | ORAL_TABLET | Freq: Every day | ORAL | Status: DC
  Administered 2016-06-24 – 2016-06-25 (×2): 25 mg via ORAL
  Filled 2016-06-23 (×2): qty 1

## 2016-06-23 MED ORDER — TRAMADOL HCL 50 MG PO TABS
50.0000 mg | ORAL_TABLET | Freq: Four times a day (QID) | ORAL | Status: DC | PRN
  Administered 2016-06-24 (×2): 50 mg via ORAL
  Filled 2016-06-23 (×2): qty 1

## 2016-06-23 MED ORDER — ENOXAPARIN SODIUM 40 MG/0.4ML ~~LOC~~ SOLN: 40.0000 mg | SUBCUTANEOUS | Status: DC

## 2016-06-23 MED ORDER — ACETAMINOPHEN 650 MG RE SUPP: 650.0000 mg | Freq: Four times a day (QID) | RECTAL | Status: DC | PRN

## 2016-06-23 MED ORDER — ATORVASTATIN CALCIUM 10 MG PO TABS
10.0000 mg | ORAL_TABLET | Freq: Every day | ORAL | Status: DC
Start: 2016-06-24 — End: 2016-06-25
  Administered 2016-06-24 – 2016-06-25 (×2): 10 mg via ORAL
  Filled 2016-06-23 (×2): qty 1

## 2016-06-23 MED ORDER — HYDROCODONE-ACETAMINOPHEN 5-325 MG PO TABS
1.0000 | ORAL_TABLET | Freq: Once | ORAL | Status: AC
  Administered 2016-06-23: 1 via ORAL
  Filled 2016-06-23: qty 1

## 2016-06-23 MED ORDER — FUROSEMIDE 20 MG PO TABS
20.0000 mg | ORAL_TABLET | Freq: Every day | ORAL | Status: DC
  Administered 2016-06-24 – 2016-06-25 (×2): 20 mg via ORAL
  Filled 2016-06-23 (×2): qty 1

## 2016-06-23 MED ORDER — ACETAMINOPHEN 325 MG PO TABS: 650.0000 mg | ORAL_TABLET | Freq: Four times a day (QID) | ORAL | Status: DC | PRN

## 2016-06-23 MED ORDER — LOSARTAN POTASSIUM 50 MG PO TABS
50.0000 mg | ORAL_TABLET | Freq: Every day | ORAL | Status: DC
  Administered 2016-06-24 – 2016-06-25 (×2): 50 mg via ORAL
  Filled 2016-06-23 (×2): qty 1

## 2016-06-23 MED ORDER — LAMOTRIGINE 25 MG PO TABS
50.0000 mg | ORAL_TABLET | Freq: Every day | ORAL | Status: DC
  Administered 2016-06-24 – 2016-06-25 (×2): 50 mg via ORAL
  Filled 2016-06-23 (×2): qty 2

## 2016-06-23 MED ORDER — ONDANSETRON HCL 4 MG PO TABS: 4.0000 mg | ORAL_TABLET | Freq: Four times a day (QID) | ORAL | Status: DC | PRN

## 2016-06-23 MED ORDER — RIFAXIMIN 550 MG PO TABS
550.0000 mg | ORAL_TABLET | Freq: Two times a day (BID) | ORAL | Status: DC
  Administered 2016-06-24 – 2016-06-25 (×3): 550 mg via ORAL
  Filled 2016-06-23 (×3): qty 1

## 2016-06-23 MED ORDER — ONDANSETRON HCL 4 MG/2ML IJ SOLN
4.0000 mg | Freq: Four times a day (QID) | INTRAMUSCULAR | Status: DC | PRN
Start: 2016-06-23 — End: 2016-06-25
  Administered 2016-06-24: 4 mg via INTRAVENOUS
  Filled 2016-06-23: qty 2

## 2016-06-23 NOTE — ED Notes (Signed)
Pt transport to 216 

## 2016-06-23 NOTE — ED Provider Notes (Signed)
[facility] Emergency Department Provider Note  ____________________________________________   First MD Initiated Contact with Patient 06/23/16 1816     (approximate)  I have reviewed the triage vital signs and the nursing notes.   HISTORY  Chief Complaint Fall and Back Pain   HPI Lawrence West is a 71 y.o. male with a history of liver cirrhosis who is presenting to the emergency department with persistent low back pain after a fall 2 weeks ago. He was seen in the emergency department for evaluation for this fall and had lumbar spine films. However, he is now complaining of low lumbar spine pain into the sacrum. He denies any radiation of the pain down to his lower extremities. Denies any urinary retention, incontinence or loss of bowel continence. Has not tried any pain medication for this at home. Was noted in triage to be tangential with pressured speech.   Past Medical History:  Diagnosis Date  . Arthritis    "left knee" (07/08/2015)  . Ascites   . Avascular necrosis of bone of left hip (HCC) 07/08/2015  . Brown recluse spider bite 1990s   "they thought I was gonna die then"  . Cirrhosis of liver (HCC)    Sees Dr. Sung Amabile  . Depression   . Edema of both legs    Takes Lasix  . Gout X 1  . High cholesterol   . Hypertension   . Narcolepsy   . Rheumatic fever   . Sleep apnea    pt stated "I have sleep apnea but I do not use the machine...Marland KitchenMarland KitchenMarland Kitchenit broke" (07/08/2015)    Patient Active Problem List   Diagnosis Date Noted  . Confusion 06/18/2016  . Acute hepatic encephalopathy 06/18/2016  . Fall at home, initial encounter 06/18/2016  . Hepatic encephalopathy (HCC) 06/18/2016  . GIB (gastrointestinal bleeding) 12/27/2015  . Hyperlipidemia 07/23/2015  . Depression 07/23/2015  . Avascular necrosis of bone of left hip (HCC) 07/08/2015  . S/P total hip arthroplasty 07/08/2015  . Ascites due to alcoholic cirrhosis (HCC) 05/19/2015    Past  Surgical History:  Procedure Laterality Date  . COSMETIC SURGERY  ~ 1997   "nose done, lip drop, chin lift"  . ESOPHAGOGASTRODUODENOSCOPY N/A 12/27/2015   Procedure: ESOPHAGOGASTRODUODENOSCOPY (EGD);  Surgeon: Charlott Rakes, MD;  Location: [facility] ENDOSCOPY;  Service: Endoscopy;  Laterality: N/A;  . FOOT FRACTURE SURGERY Right   . FRACTURE SURGERY    . INGUINAL HERNIA REPAIR Bilateral 1990s  . TONSILLECTOMY  1954  . TOTAL HIP ARTHROPLASTY Left 07/08/2015  . TOTAL HIP ARTHROPLASTY Left 07/08/2015   Procedure: LEFT TOTAL HIP ARTHROPLASTY;  Surgeon: Teryl Lucy, MD;  Location: [facility] OR;  Service: Orthopedics;  Laterality: Left;  . TOTAL KNEE ARTHROPLASTY Left 03/2008  . US GUIDED PARACENTESIS [facility] HX)  April 2017    Prior to Admission medications   Medication Sig Start Date End Date Taking? Authorizing Provider  atorvastatin (LIPITOR) 10 MG tablet Take 1 tablet (10 mg total) by mouth daily. 05/20/15   Enid Baas, MD  bismuth subsalicylate (PEPTO BISMOL) 262 MG/15ML suspension Take 30 mLs by mouth every 6 (six) hours as needed.    [provider]  furosemide (LASIX) 20 MG tablet Take 20 mg by mouth daily. 06/08/16   [provider]  lactulose (CHRONULAC) 10 GM/15ML solution Take 5 g by mouth 2 (two) times daily.    [provider]  lamoTRIgine (LAMICTAL) 25 MG tablet Take 50 mg by mouth daily.  06/08/16  [provider]  losartan (COZAAR) 50 MG tablet Take 1 tablet by mouth daily. 09/02/15 09/01/16  [provider]  ondansetron (ZOFRAN) 4 MG tablet Take 1 tablet (4 mg total) by mouth every 8 (eight) hours as needed for nausea or vomiting. 07/08/15   Teryl Lucy, MD  pantoprazole (PROTONIX) 40 MG tablet Take 1 tablet (40 mg total) by mouth 2 (two) times daily. 12/30/15   Auburn Bilberry, MD  rifaximin (XIFAXAN) 550 MG TABS tablet Take 1 tablet (550 mg total) by mouth 2 (two) times daily. 06/19/16   Milagros Loll, MD  spironolactone (ALDACTONE) 25  MG tablet Take 1 tablet (25 mg total) by mouth daily. 12/30/15   Auburn Bilberry, MD  traMADol (ULTRAM) 50 MG tablet Take 1 tablet (50 mg total) by mouth every 6 (six) hours as needed for severe pain. 12/31/15 12/30/16  Phineas Semen, MD    Allergies Patient has no known allergies.  Family History  Problem Relation Age of Onset  . Melanoma Father   . Lung cancer Father     Social History Social History  Substance Use Topics  . Smoking status: Never Smoker  . Smokeless tobacco: Never Used  . Alcohol use No     Comment: 07/08/2015 former Heavy ETOH abuse.; "haven't had a drink in 2017; AA told me I didn't need to come back"    Review of Systems  Constitutional: No fever/chills Eyes: No visual changes. ENT: No sore throat. Cardiovascular: Denies chest pain. Respiratory: Denies shortness of breath. Gastrointestinal: No abdominal pain.  No nausea, no vomiting.  No diarrhea.  No constipation. Genitourinary: Negative for dysuria. Musculoskeletal: as above. Skin: Negative for rash. Neurological: Negative for headaches, focal weakness or numbness.   ____________________________________________   PHYSICAL EXAM:  VITAL SIGNS: ED Triage Vitals  Enc Vitals Group     BP 06/23/16 1738 116/77     Pulse Rate 06/23/16 1738 99     Resp 06/23/16 1738 18     Temp 06/23/16 1738 97.7 F (36.5 C)     Temp Source 06/23/16 1738 Oral     SpO2 06/23/16 1738 94 %     Weight 06/23/16 1740 208 lb (94.3 kg)     Height 06/23/16 1740 5\' 10"  (1.778 m)     Head Circumference --      Peak Flow --      Pain Score 06/23/16 1739 6     Pain Loc --      Pain Edu? --      Excl. in GC? --     Constitutional: Alert and oriented.  in no acute distress. Eyes: mild scleral icterus Head: Atraumatic. Nose: No congestion/rhinnorhea. Mouth/Throat: Mucous membranes are moist.   Neck: No stridor.   Cardiovascular: Normal rate, regular rhythm. Grossly normal heart sounds.   Respiratory: Normal  respiratory effort.  No retractions. Lungs CTAB. Gastrointestinal: Soft and nontender. No distention.  No CVA tenderness. Negative for asterixis. Musculoskeletal: No lower extremity tenderness nor edema.  No joint effusions.  No tenderness to palpation to the thoracic or lumbar spines nor over the pelvis. No deformity nor step-off. Neurologic:  Normal speech and language. No gross focal neurologic deficits are appreciated.  Skin:  Skin is warm, dry and intact. No rash noted. Psychiatric: Mood and affect are normal. Speech and behavior are normal.  ____________________________________________   LABS (all labs ordered are listed, but only abnormal results are displayed)  Labs Reviewed  CBC - Abnormal; Notable for the following:  Result Value   RBC 3.99 (*)    Hemoglobin 11.9 (*)    HCT 35.7 (*)    RDW 18.8 (*)    Platelets 144 (*)    All other components within normal limits  COMPREHENSIVE METABOLIC PANEL - Abnormal; Notable for the following:    Sodium 134 (*)    Glucose, Bld 101 (*)    Calcium 8.5 (*)    Albumin 3.0 (*)    AST 62 (*)    ALT 16 (*)    Total Bilirubin 2.4 (*)    All other components within normal limits  AMMONIA - Abnormal; Notable for the following:    Ammonia 50 (*)    All other components within normal limits  URINALYSIS, COMPLETE (UACMP) WITH MICROSCOPIC - Abnormal; Notable for the following:    Color, Urine YELLOW (*)    APPearance CLEAR (*)    Specific Gravity, Urine 1.002 (*)    All other components within normal limits  ETHANOL  PROTIME-INR   ____________________________________________  EKG   ____________________________________________  RADIOLOGY  Negative for sacral fracture. ____________________________________________   PROCEDURES  Procedure(s) performed:   Procedures  Critical Care performed:   ____________________________________________   INITIAL IMPRESSION / ASSESSMENT AND PLAN / ED COURSE  Pertinent labs &  imaging results that were available during my care of the patient were reviewed by me and considered in my medical decision making (see chart for details).  ----------------------------------------- 7:09 PM on 06/23/2016 -----------------------------------------  While the patient was in x-ray, his accompanying friend, Bruce, toes that the patient has not been taking his medications and has been self-medicating with vodka. The friend says that the patient is not acting like himself today with his pressured speech and often going off on tangents. He says that once I left the room the first time after my initial assessment the patient asked for gun nor to kill himself after his 71th birthday. While some of this may be attributable to the patient's elevated ammonia and hepatic and cephalopathy. It is possible that he could be having organic psychiatric issues. He will be involuntarily committed. However, I will admit him medically for back and cephalopathy. The last time he was admitted for hepatic encephalopathy several weeks ago. His ammonia was only 48.    ----------------------------------------- 7:29 PM on 06/23/2016 -----------------------------------------  Patient aware of need for admission to the hospital. Placed under involuntary commitment secondary to threats of suicide.  Discussed the case with Dr. Cherlynn KaiserSainani who will be admitting the patient. I also told that the patient will only talk about the suicidal thoughts when he has not in the presence of a physician.  ____________________________________________   FINAL CLINICAL IMPRESSION(S) / ED DIAGNOSES  Hepatic encephalopathy. Suicidal ideation.    NEW MEDICATIONS STARTED DURING THIS VISIT:  New Prescriptions   No medications on file     Note:  This document was prepared using Dragon voice recognition software and may include unintentional dictation errors.    Myrna BlazerSchaevitz, Tawan Matthew, MD 06/23/16 [phone]

## 2016-06-23 NOTE — ED Notes (Signed)
Patient transported to X-ray 

## 2016-06-23 NOTE — ED Notes (Signed)
Dr Pershing Proudschaevitz at bedside to speak with friend of pt

## 2016-06-23 NOTE — ED Triage Notes (Signed)
[facility] for [facility] pain and confusion. Pt has history of liver failure. Pt fell two weeks ago and reports low back pain. Pt states he just needs a pain pill. Pt speaking rapidly and changes topic of conversation repeatedly.

## 2016-06-23 NOTE — Progress Notes (Signed)
Notified by nursing that [facility] and [facility] recommend against [facility], but do recommend sitter at bedside for elopement precautions.  Orders changed/placed.  Kristeen MissWILLIS, Azzam FIELDING [facility] 06/23/2016, 9:58 PM

## 2016-06-23 NOTE — H&P (Signed)
[employer] [facility] - [place] at [facility]   PATIENT NAME: Lawrence West    MR#:  [id number]  DATE OF BIRTH:  [birth year]  DATE OF ADMISSION:  06/23/2016  PRIMARY CARE PHYSICIAN: Kandyce Rud, MD   REQUESTING/REFERRING PHYSICIAN: Dr. Gladstone Pih  CHIEF COMPLAINT:   Chief Complaint  Patient presents with  . Fall  . Back Pain    HISTORY OF PRESENT ILLNESS:  Lawrence West  is a 71 y.o. male with a known history of Alcoholic liver cirrhosis, osteoarthritis, depression, hypertension, obstructive sleep apnea, hyperlipidemia, history of gout, who presents to the hospital due to back pain after a recent fall 2 weeks ago. Patient presents today due to some lower back pain which he developed after a mechanical fall a few weeks back. He says his pain was getting worse so he came to the ER for further evaluation. Patient does have a history of alcoholic liver cirrhosis with hepatic encephalopathy and was recently admitted on 06/18/2016 due to altered mental status but it's unclear whether he was discharged on left AGAINST MEDICAL ADVICE as there is no documentation. Patient says he has not been compliant with his lactulose last week but has started taking it today. He is very tangential in his thought process and cannot give a good history he is although alert awake and oriented to time place and person. Patient also apparently has been having suicidal ideations although he denies that to me. He denies any homicidal ideations or any hallucinations. Hospitalist services were contacted further treatment and evaluation given his hepatic encephalopathy.  PAST MEDICAL HISTORY:   Past Medical History:  Diagnosis Date  . Arthritis    "left knee" (07/08/2015)  . Ascites   . Avascular necrosis of bone of left hip (HCC) 07/08/2015  . Brown recluse spider bite 1990s   "they thought I was gonna die then"  . Cirrhosis of liver (HCC)    Sees Dr. Sung Amabile  . Depression   . Edema of both  legs    Takes Lasix  . Gout X 1  . High cholesterol   . Hypertension   . Narcolepsy   . Rheumatic fever   . Sleep apnea    pt stated "I have sleep apnea but I do not use the machine...Marland KitchenMarland KitchenMarland Kitchenit broke" (07/08/2015)    PAST SURGICAL HISTORY:   Past Surgical History:  Procedure Laterality Date  . COSMETIC SURGERY  ~ 1997   "nose done, lip drop, chin lift"  . ESOPHAGOGASTRODUODENOSCOPY N/A 12/27/2015   Procedure: ESOPHAGOGASTRODUODENOSCOPY (EGD);  Surgeon: Charlott Rakes, MD;  Location[facility] ENDOSCOPY;  Service: Endoscopy;  Laterality: N/A;  . FOOT FRACTURE SURGERY Right   . FRACTURE SURGERY    . INGUINAL HERNIA REPAIR Bilateral 1990s  . TONSILLECTOMY  1954  . TOTAL HIP ARTHROPLASTY Left 07/08/2015  . TOTAL HIP ARTHROPLASTY Left 07/08/2015   Procedure: LEFT TOTAL HIP ARTHROPLASTY;  Surgeon: Teryl Lucy, MD;  Location: [facility] OR;  Service: Orthopedics;  Laterality: Left;  . TOTAL KNEE ARTHROPLASTY Left 03/2008  . US GUIDED PARACENTESIS [facility] HX)  April 2017    SOCIAL HISTORY:   Social History  Substance Use Topics  . Smoking status: Never Smoker  . Smokeless tobacco: Never Used  . Alcohol use No     Comment: 07/08/2015 former Heavy ETOH abuse.; "haven't had a drink in 2017; AA told me I didn't need to come back"    FAMILY HISTORY:   Family History  Problem Relation Age of Onset  . Melanoma  Father   . Lung cancer Father     DRUG ALLERGIES:  No Known Allergies  REVIEW OF SYSTEMS:   Review of Systems  Constitutional: Negative for fever and weight loss.  HENT: Negative for congestion, nosebleeds and tinnitus.   Eyes: Negative for blurred vision, double vision and redness.  Respiratory: Negative for cough, hemoptysis and shortness of breath.   Cardiovascular: Negative for chest pain, orthopnea, leg swelling and PND.  Gastrointestinal: Negative for abdominal pain, diarrhea, melena, nausea and vomiting.  Genitourinary: Negative for dysuria, hematuria and urgency.   Musculoskeletal: Positive for back pain and falls. Negative for joint pain.  Neurological: Negative for dizziness, tingling, sensory change, focal weakness, seizures, weakness and headaches.  Endo/Heme/Allergies: Negative for polydipsia. Does not bruise/bleed easily.  Psychiatric/Behavioral: Negative for depression and memory loss. The patient is not nervous/anxious.     MEDICATIONS AT HOME:   Prior to Admission medications   Medication Sig Start Date End Date Taking? Authorizing Provider  atorvastatin (LIPITOR) 10 MG tablet Take 1 tablet (10 mg total) by mouth daily. 05/20/15  Yes Enid BaasKalisetti, Radhika, MD  bismuth subsalicylate (PEPTO BISMOL) 262 MG/15ML suspension Take 30 mLs by mouth every 6 (six) hours as needed.   Yes [provider]  furosemide (LASIX) 20 MG tablet Take 20 mg by mouth daily. 06/08/16  Yes [provider]  lactulose (CHRONULAC) 10 GM/15ML solution Take 5 g by mouth 2 (two) times daily.   Yes [provider]  lamoTRIgine (LAMICTAL) 25 MG tablet Take 50 mg by mouth daily.  06/08/16  Yes [provider]  losartan (COZAAR) 50 MG tablet Take 1 tablet by mouth daily. 09/02/15 09/01/16 Yes [provider]  ondansetron (ZOFRAN) 4 MG tablet Take 1 tablet (4 mg total) by mouth every 8 (eight) hours as needed for nausea or vomiting. 07/08/15  Yes Teryl LucyLandau, Joshua, MD  pantoprazole (PROTONIX) 40 MG tablet Take 1 tablet (40 mg total) by mouth 2 (two) times daily. 12/30/15  Yes Auburn BilberryPatel, Shreyang, MD  rifaximin (XIFAXAN) 550 MG TABS tablet Take 1 tablet (550 mg total) by mouth 2 (two) times daily. 06/19/16  Yes Sudini, Wardell HeathSrikar, MD  spironolactone (ALDACTONE) 25 MG tablet Take 1 tablet (25 mg total) by mouth daily. 12/30/15  Yes Auburn BilberryPatel, Shreyang, MD  traMADol (ULTRAM) 50 MG tablet Take 1 tablet (50 mg total) by mouth every 6 (six) hours as needed for severe pain. 12/31/15 12/30/16 Yes Phineas SemenGoodman, Graydon, MD      VITAL SIGNS:  Blood pressure 116/77, pulse 99,  temperature 97.7 F (36.5 C), temperature source Oral, resp. rate 18, height 5\' 10"  (1.778 m), weight 94.3 kg (208 lb), SpO2 94 %.  PHYSICAL EXAMINATION:  Physical Exam  GENERAL:  71 y.o.-year-old patient lying in bed in no acute distress.  EYES: Pupils equal, round, reactive to light and accommodation. No scleral icterus. Extraocular muscles intact.  HEENT: Head atraumatic, normocephalic. Oropharynx and nasopharynx clear. No oropharyngeal erythema, moist oral mucosa  NECK:  Supple, no jugular venous distention. No thyroid enlargement, no tenderness.  LUNGS: Normal breath sounds bilaterally, no wheezing, rales, rhonchi. No use of accessory muscles of respiration.  CARDIOVASCULAR: S1, S2 RRR. No murmurs, rubs, gallops, clicks.  ABDOMEN: Soft, nontender, distended, + fluid wave due to ascites. Bowel sounds present. No organomegaly or mass.  EXTREMITIES: No pedal edema, cyanosis, or clubbing. + 2 pedal & radial pulses b/l.   NEUROLOGIC: Cranial nerves II through XII are intact. No focal Motor or sensory deficits appreciated b/l PSYCHIATRIC: The patient  is alert and oriented x 3.   SKIN: No obvious rash, lesion, or ulcer.   LABORATORY PANEL:   CBC  Recent Labs Lab 06/23/16 1746  WBC 7.4  HGB 11.9*  HCT 35.7*  PLT 144*   ------------------------------------------------------------------------------------------------------------------  Chemistries   Recent Labs Lab 06/23/16 1746  NA 134*  K 3.6  CL 103  CO2 22  GLUCOSE 101*  BUN 9  CREATININE 0.71  CALCIUM 8.5*  AST 62*  ALT 16*  ALKPHOS 93  BILITOT 2.4*   ------------------------------------------------------------------------------------------------------------------  Cardiac Enzymes No results for input(s): TROPONINI in the last 168 hours. ------------------------------------------------------------------------------------------------------------------  RADIOLOGY:  Dg Sacrum/coccyx  Result Date:  06/23/2016 CLINICAL DATA:  Fall with acute sacral and coccygeal pain. Initial encounter. EXAM: SACRUM AND COCCYX - 2+ VIEW COMPARISON:  06/06/2016 radiographs FINDINGS: There is no evidence of fracture or other focal bone lesions. IMPRESSION: Negative. Electronically Signed   By: Harmon Pier M.D.   On: 06/23/2016 19:01     IMPRESSION AND PLAN:   71 year old male with past medical history of alcohol abuse, alcoholic liver cirrhosis, history of hepatic encephalopathy, depression, hypertension, hyperlipidemia who presents to the hospital due to back pain after a recent fall and also noted to be altered.  1. Hepatic encephalopathy-this is the cause of patient's altered mental status. Patient apparently has been noncompliant with his lactulose. -We will resume his lactulose at a higher dose of 30 mg twice daily. Titrate 2-3 loose stools daily. -Continue Xifaxan.  2. Depression with suicidal ideations-patient apparently denies that to me but did say he had suicidal ideations to his friend brought him in and also to the ER physician. Patient has been involuntarily committed, pending psychiatric consult. -Continue Lamictal.  3. GERD-continue Protonix  4. Essential hypertension-continue losartan.  5. History of chronic liver disease secondary to alcohol abuse with liver cirrhosis-continue treatment of hepatic encephalopathy as mentioned above. -Continue Lasix, Aldactone. Patient does get weekly paracentesis due to his ascites. -Follows with Dr. Mechele Collin.  6. Back pain-secondary to the mechanical fall. X-rays previously have been negative for any acute fracture. -Continue tramadol as needed.  7. Hyperlipidemia - cont. Atorvastatin  All the records are reviewed and case discussed with ED provider. Management plans discussed with the patient, family and they are in agreement.  CODE STATUS: Full code  TOTAL TIME TAKING CARE OF THIS PATIENT: 40 minutes.    Houston Siren M.D on 06/23/2016 at  7:49 PM  Between 7am to 6pm - Pager - [phone]  After 6pm go to [email or web address] - password [email or web address] [facility]  [facility]  Office  [phone]  CC: Primary care physician; Kandyce Rud, MD

## 2016-06-24 ENCOUNTER — Inpatient Hospital Stay: Payer: Medicare HMO

## 2016-06-24 DIAGNOSIS — K7290 Hepatic failure, unspecified without coma: Secondary | ICD-10-CM

## 2016-06-24 LAB — COMPREHENSIVE METABOLIC PANEL
ALK PHOS: 73 U/L (ref 38–126)
ALT: 15 U/L — AB (ref 17–63)
ANION GAP: 6 (ref 5–15)
AST: 53 U/L — AB (ref 15–41)
Albumin: 2.3 g/dL — ABNORMAL LOW (ref 3.5–5.0)
BUN: 9 mg/dL (ref 6–20)
CO2: 25 mmol/L (ref 22–32)
Calcium: 8 mg/dL — ABNORMAL LOW (ref 8.9–10.3)
Chloride: 105 mmol/L (ref 101–111)
Creatinine, Ser: 0.91 mg/dL (ref 0.61–1.24)
GFR calc Af Amer: >60 mL/min (ref >60)
Glucose, Bld: 94 mg/dL (ref 65–99)
POTASSIUM: 3.5 mmol/L (ref 3.5–5.1)
Sodium: 136 mmol/L (ref 135–145)
TOTAL PROTEIN: 5.6 g/dL — AB (ref 6.5–8.1)
Total Bilirubin: 1.6 mg/dL — ABNORMAL HIGH (ref 0.3–1.2)

## 2016-06-24 LAB — CBC
HCT: 29.1 % — ABNORMAL LOW (ref 40.0–52.0)
HEMOGLOBIN: 9.8 g/dL — AB (ref 13.0–18.0)
MCH: 30.1 pg (ref 26.0–34.0)
MCHC: 33.7 g/dL (ref 32.0–36.0)
MCV: 89.4 fL (ref 80.0–100.0)
PLATELETS: 107 10*3/uL — AB (ref 150–440)
RBC: 3.26 MIL/uL — AB (ref 4.40–5.90)
RDW: 18.7 % — ABNORMAL HIGH (ref 11.5–14.5)
WBC: 5.1 10*3/uL (ref 3.8–10.6)

## 2016-06-24 LAB — AMMONIA: AMMONIA: 42 umol/L — AB (ref 9–35)
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
## 2016-06-24 NOTE — Evaluation (Signed)
Physical Therapy Evaluation Patient Details Name: Lawrence West MRN: [id number] DOB: 01/01/1946 Today's Date: 06/24/2016   History of Present Illness  Pt is a 71 y.o. male presenting to hospital with h/o fall (with subsequent back pain) x2 weeks ago.  Imaging negative for fx.  Pt admitted with hepatic encephalopathy.  PMH includes L THA posterior approach 07/08/15, narcolepsy, alcoholic liver cirrhosis.  Clinical Impression  Prior to hospital admission, pt was modified independent (using SPC as needed for balance with ambulation).  Pt lives alone on main level of home with ramp to enter.  Currently pt is independent with bed mobility, SBA with transfers, and CGA ambulating around nursing loop with SPC.  Pt initially with mild unsteadiness and narrow BOS during ambulation (pt also alternates between use and non-use of SPC) but balance and gait technique improved with distance. Mild unsteadiness and altered stepping pattern noted with ambulation with head turns R/L/up/down and increasing/decreasing speed (pt able to self correct balance).   Pt would benefit from skilled PT to address noted impairments and functional limitations (see below for any additional details).  Upon hospital discharge, recommend pt discharge to home (will focus on higher level balance activities during hospital stay but no further PT needs anticipated upon hospital discharge).    Follow Up Recommendations No PT follow up    Equipment Recommendations  Cane    Recommendations for Other Services       Precautions / Restrictions Precautions Precautions: Fall Restrictions Weight Bearing Restrictions: No      Mobility  Bed Mobility Overal bed mobility: Independent             General bed mobility comments: Supine to sit without any difficulties.  Transfers Overall transfer level: Needs assistance Equipment used: Straight cane Transfers: Sit to/from Stand Sit to Stand: Supervision         General transfer  comment: use of UE's and SPC; steady without loss of balance  Ambulation/Gait Ambulation/Gait assistance: Min guard Ambulation Distance (Feet):  (200 feet x2) Assistive device: Straight cane Gait Pattern/deviations: Step-through pattern   Gait velocity interpretation: at or above normal speed for age/gender General Gait Details: pt initially with mild unsteadiness and narrow BOS during ambulation (pt also alternates between use and non-use of SPC) but balance and gait technique improved with distance  Stairs            Wheelchair Mobility    Modified Rankin (Stroke Patients Only)       Balance Overall balance assessment: Needs assistance Sitting-balance support: No upper extremity supported;Feet supported Sitting balance-Leahy Scale: Normal     Standing balance support: Single extremity supported;During functional activity Standing balance-Leahy Scale: Good Standing balance comment: ambulation with SPC               High Level Balance Comments: mild unsteadiness and altered stepping pattern noted with ambulation and head turns R/L/up/down and increasing/decreasing speed (pt able to self correct)             Pertinent Vitals/Pain Pain Assessment: 0-10 Pain Score: 2  Pain Location: low back pain Pain Descriptors / Indicators: Sore;Tender Pain Intervention(s): Limited activity within patient's tolerance;Monitored during session;Premedicated before session;Repositioned  O2 93% or greater on room air and HR 106-118 bpm during session.    Home Living Family/patient expects to be discharged to:: Private residence Living Arrangements: Alone Available Help at Discharge: Friend(s);Available PRN/intermittently Type of Home: House Home Access: Ramped entrance     Home Layout: Multi-level;Able to live on main  level with bedroom/bathroom Home Equipment: Walker - 2 wheels;Cane - single point      Prior Function Level of Independence: Independent with assistive  device(s)         Comments: Pt using SPC in home; doesn't use any AD in community but will use motorized carts at stores.     Hand Dominance        Extremity/Trunk Assessment   Upper Extremity Assessment Upper Extremity Assessment: Overall WFL for tasks assessed    Lower Extremity Assessment Lower Extremity Assessment: Overall WFL for tasks assessed    Cervical / Trunk Assessment Cervical / Trunk Assessment: Normal  Communication   Communication: No difficulties  Cognition Arousal/Alertness: Awake/alert Behavior During Therapy: WFL for tasks assessed/performed Overall Cognitive Status: Within Functional Limits for tasks assessed                                 General Comments: Pt very verbose during session.      General Comments   Nursing cleared pt for participation in physical therapy.  Pt agreeable to PT session.    Exercises  Ambulation; balance   Assessment/Plan    PT Assessment Patient needs continued PT services  PT Problem List Decreased balance       PT Treatment Interventions Gait training;Balance training;Therapeutic exercise;Patient/family education;Therapeutic activities    PT Goals (Current goals can be found in the Care Plan section)  Acute Rehab PT Goals Patient Stated Goal: To return home  PT Goal Formulation: With patient Time For Goal Achievement: 07/08/16 Potential to Achieve Goals: Good    Frequency Min 2X/week   Barriers to discharge        Co-evaluation               AM-PAC PT "6 Clicks" Daily Activity  Outcome Measure Difficulty turning over in bed (including adjusting bedclothes, sheets and blankets)?: None Difficulty moving from lying on back to sitting on the side of the bed? : None Difficulty sitting down on and standing up from a chair with arms (e.g., wheelchair, bedside commode, etc,.)?: A Little Help needed moving to and from a bed to chair (including a wheelchair)?: A Little Help needed  walking in hospital room?: A Little Help needed climbing 3-5 steps with a railing? : A Little 6 Click Score: 20    End of Session Equipment Utilized During Treatment: Gait belt Activity Tolerance: Patient tolerated treatment well Patient left: in chair;with call bell/phone within reach;with chair alarm set Nurse Communication: Mobility status;Precautions PT Visit Diagnosis: Unsteadiness on feet (R26.81)    Time: 8469-6295 PT Time Calculation (min) (ACUTE ONLY): 25 min   Charges:   PT Evaluation $PT Eval Low Complexity: 1 Procedure PT Treatments $Therapeutic Activity: 8-22 mins   PT G CodesHendricks Limes, PT 06/24/16, 3:50 PM [phone]

## 2016-06-24 NOTE — Progress Notes (Signed)
Patient declined 0000 medications - he didn't want anything else until the morning.  Next dose will be at 10:00.

## 2016-06-24 NOTE — Progress Notes (Signed)
[facility] - [place] at [facility]   PATIENT NAME: Lawrence West    MR#:  [id number]  DATE OF BIRTH:  1946-01-21  SUBJECTIVE:    REVIEW OF SYSTEMS:   ROS Tolerating Diet: Tolerating PT:   DRUG ALLERGIES:  No Known Allergies  VITALS:  Blood pressure 127/73, pulse 95, temperature 97.9 F (36.6 C), temperature source Oral, resp. rate 18, height 5\' 10"  (1.778 m), weight 95.5 kg (210 lb 9.6 oz), SpO2 96 %.  PHYSICAL EXAMINATION:   Physical Exam  GENERAL:  71 y.o.-year-old patient lying in the bed with no acute distress.  EYES: Pupils equal, round, reactive to light and accommodation. No scleral icterus. Extraocular muscles intact.  HEENT: Head atraumatic, normocephalic. Oropharynx and nasopharynx clear.  NECK:  Supple, no jugular venous distention. No thyroid enlargement, no tenderness.  LUNGS: Normal breath sounds bilaterally, no wheezing, rales, rhonchi. No use of accessory muscles of respiration.  CARDIOVASCULAR: S1, S2 normal. No murmurs, rubs, or gallops.  ABDOMEN: Soft, nontender, nondistended. Bowel sounds present. No organomegaly or mass.  EXTREMITIES: No cyanosis, clubbing or edema b/l.    NEUROLOGIC: Cranial nerves II through XII are intact. No focal Motor or sensory deficits b/l.   PSYCHIATRIC:  patient is alert and oriented x 3.  SKIN: No obvious rash, lesion, or ulcer.   LABORATORY PANEL:  CBC  Recent Labs Lab 06/24/16 0552  WBC 5.1  HGB 9.8*  HCT 29.1*  PLT 107*    Chemistries   Recent Labs Lab 06/24/16 0552  NA 136  K 3.5  CL 105  CO2 25  GLUCOSE 94  BUN 9  CREATININE 0.91  CALCIUM 8.0*  AST 53*  ALT 15*  ALKPHOS 73  BILITOT 1.6*   Cardiac Enzymes No results for input(s): TROPONINI in the last 168 hours. RADIOLOGY:  Dg Sacrum/coccyx  Result Date: 06/23/2016 CLINICAL DATA:  Fall with acute sacral and coccygeal pain. Initial encounter. EXAM: SACRUM AND COCCYX - 2+ VIEW COMPARISON:  06/06/2016 radiographs  FINDINGS: There is no evidence of fracture or other focal bone lesions. IMPRESSION: Negative. Electronically Signed   By: Harmon Pier M.D.   On: 06/23/2016 19:01   ASSESSMENT AND PLAN:  71 year old male with past medical history of alcohol abuse, alcoholic liver cirrhosis, history of hepatic encephalopathy, depression, hypertension, hyperlipidemia who presents to the hospital due to back pain after a recent fall and also noted to be altered.  1. Hepatic encephalopathy-this is the cause of patient's altered mental status. Patient apparently has been noncompliant with his lactulose. -We will resume his lactulose at a higher dose of 30 mg twice daily. Titrate 2-3 loose stools daily. -Continue Xifaxan.  2. Depression with suicidal ideations-patient apparently denies that to me but did say he had suicidal ideations to his friend brought him in and also to the ER physician. - Patient is taken off IVC by [facility] psychiatry - pending psychiatric consult. -Continue Lamictal.  3. GERD-continue Protonix  4. Essential hypertension-continue losartan.  5. History of chronic liver disease secondary to alcohol abuse with liver cirrhosis-continue treatment of hepatic encephalopathy as mentioned above. -Continue Lasix, Aldactone. Patient does get weekly paracentesis due to his ascites. Next one scheduled tomorrow -Follows with Dr. Mechele Collin.  6. Back pain-secondary to the mechanical fall. X-rays previously have been negative for any acute fracture. -Continue tramadol as needed.  7. Hyperlipidemia - cont. Atorvastatin  PT no PT f/u D/c tomorrow after paracentesis  Case discussed with Care Management/Social Worker. Management plans discussed with the  patient, family and they are in agreement.  CODE STATUS: FULL  DVT Prophylaxis: Lovenox  TOTAL TIME TAKING CARE OF THIS PATIENT: *30* minutes.  >50% time spent on counselling and coordination of care  POSSIBLE D/C IN *1-2* DAYS, DEPENDING ON  CLINICAL CONDITION.  Note: This dictation was prepared with Dragon dictation along with smaller phrase technology. Any transcriptional errors that result from this process are unintentional.  Miakoda Mcmillion M.D on 06/24/2016 at 6:05 PM  Between 7am to 6pm - Pager - [phone]  After 6pm go to [email or web address] - Social research officer, governmentpassword EPAS [email or web address]  [facility] [place] [facility]  Office  [phone]  CC: Primary care physician; [name], [name], MD

## 2016-06-24 NOTE — Progress Notes (Signed)
Patient requested that sister Claris GowerCharlotte be removed from contact list and new password be provided. Patient contact list and password updated. Patient resting in bed, continue to assess.

## 2016-06-24 NOTE — Progress Notes (Signed)
Alanson PulsCharlotte Pagent, pt sister # [phone]

## 2016-06-25 ENCOUNTER — Encounter: Payer: Self-pay | Admitting: *Deleted

## 2016-06-25 ENCOUNTER — Ambulatory Visit: Admission: RE | Admit: 2016-06-25 | Payer: Medicare HMO | Source: Ambulatory Visit

## 2016-06-25 MED ORDER — LACTULOSE 10 GM/15ML PO SOLN: 20.0000 g | Freq: Two times a day (BID) | ORAL | 0 refills | Status: DC

## 2016-06-25 NOTE — Progress Notes (Signed)
Discharge instructions reviewed with the patient.  Discharge papers given to the patient.  I pushed the importance of taking the lactulose when discharged.  Pt sent out via wheelchair to his friends waiting car

## 2016-06-25 NOTE — Discharge Summary (Signed)
[facility] - [place] at [facility]   PATIENT NAME: Lawrence West    MR#:  [id number]  DATE OF BIRTH:  10/30/1945  DATE OF ADMISSION:  06/23/2016 ADMITTING PHYSICIAN: [place] Siren, MD  DATE OF DISCHARGE: 06/25/16  PRIMARY CARE PHYSICIAN: Kandyce Rud, MD    ADMISSION DIAGNOSIS:  Hepatic encephalopathy (HCC) [K72.90] Suicidal ideation [R45.851]  DISCHARGE DIAGNOSIS:  Acute on chronic Hepatic encephalopathy due to Alcoholic Liver disease Recurrent ascites--s/p Paracentesis 5 liters removed on 06/25/2016   SECONDARY DIAGNOSIS:   Past Medical History:  Diagnosis Date  . Arthritis    "left knee" (07/08/2015)  . Ascites   . Avascular necrosis of bone of left hip (HCC) 07/08/2015  . Brown recluse spider bite 1990s   "they thought I was gonna die then"  . Cirrhosis of liver (HCC)    Sees Dr. Sung Amabile  . Depression   . Edema of both legs    Takes Lasix  . Gout X 1  . High cholesterol   . Hypertension   . Narcolepsy   . Rheumatic fever   . Sleep apnea    pt stated "I have sleep apnea but I do not use the machine...Marland KitchenMarland KitchenMarland Kitchenit broke" (07/08/2015)    HOSPITAL COURSE:  71 year old male with past medical history of alcohol abuse, alcoholic liver cirrhosis, history of hepatic encephalopathy, depression, hypertension, hyperlipidemia who presents to the hospital due to back pain after a recent fall and also noted to be altered.  1. Hepatic encephalopathy-this is the cause of patient's altered mental status. Patient apparently has been noncompliant with his lactulose. - will resume his lactulose at a higher dose of 30 mg twice daily. Titrate 2-3 loose stools daily. -Continue Xifaxan. -mentation back to normal  2. Depression with suicidal ideations-patient apparently denies that to me but did say he had suicidal ideations to his friend brought him in and also to the ER physician. - Patient is taken off IVC by [facility] psychiat[facility] -psychiatric consult  appreciated---no recommendations needed -Continue Lamictal.  3. GERD-continue Protonix  4. Essential hypertension-continue losartan.  5. History of chronic liver disease secondary to alcohol abuse with liver cirrhosis-continue treatment of hepatic encephalopathy as mentioned above. -Continue Lasix, Aldactone. Patient does get weekly paracentesis due to his ascites. Next one scheduled tomorrow -Follows with Dr. Mechele Collin. -s/p paracentesis on may 18th  6. Back pain-secondary to the mechanical fall. X-rays previously have been negative for any acute fracture. -Continue tramadol as needed.  7. Hyperlipidemia - cont. Atorvastatin  PT no PT f/u   D/c home  CONSULTS OBTAINED:  Treatment Team:  Clapacs, Jackquline Denmark, MD  DRUG ALLERGIES:  No Known Allergies  DISCHARGE MEDICATIONS:   Current Discharge Medication List    CONTINUE these medications which have CHANGED   Details  lactulose (CHRONULAC) 10 GM/15ML solution Take 30 mLs (20 g total) by mouth 2 (two) times daily. Qty: 240 mL, Refills: 0      CONTINUE these medications which have NOT CHANGED   Details  atorvastatin (LIPITOR) 10 MG tablet Take 1 tablet (10 mg total) by mouth daily. Qty: 30 tablet, Refills: 2    bismuth subsalicylate (PEPTO BISMOL) 262 MG/15ML suspension Take 30 mLs by mouth every 6 (six) hours as needed.    furosemide (LASIX) 20 MG tablet Take 20 mg by mouth daily.    lamoTRIgine (LAMICTAL) 25 MG tablet Take 50 mg by mouth daily.     losartan (COZAAR) 50 MG tablet Take 1 tablet by mouth daily.  ondansetron (ZOFRAN) 4 MG tablet Take 1 tablet (4 mg total) by mouth every 8 (eight) hours as needed for nausea or vomiting. Qty: 30 tablet, Refills: 0    pantoprazole (PROTONIX) 40 MG tablet Take 1 tablet (40 mg total) by mouth 2 (two) times daily. Qty: 60 tablet, Refills: 0    rifaximin (XIFAXAN) 550 MG TABS tablet Take 1 tablet (550 mg total) by mouth 2 (two) times daily. Qty: 60 tablet, Refills: 0     spironolactone (ALDACTONE) 25 MG tablet Take 1 tablet (25 mg total) by mouth daily. Qty: 30 tablet, Refills: 0    traMADol (ULTRAM) 50 MG tablet Take 1 tablet (50 mg total) by mouth every 6 (six) hours as needed for severe pain. Qty: 15 tablet, Refills: 0        If you experience worsening of your admission symptoms, develop shortness of breath, life threatening emergency, suicidal or homicidal thoughts you must seek medical attention immediately by calling 911 or calling your MD immediately  if symptoms less severe.  You Must read complete instructions/literature along with all the possible adverse reactions/side effects for all the Medicines you take and that have been prescribed to you. Take any new Medicines after you have completely understood and accept all the possible adverse reactions/side effects.   Please note  You were cared for by a hospitalist during your hospital stay. If you have any questions about your discharge medications or the care you received while you were in the hospital after you are discharged, you can call the unit and asked to speak with the hospitalist on call if the hospitalist that took care of you is not available. Once you are discharged, your primary care physician will handle any further medical issues. Please note that NO REFILLS for any discharge medications will be authorized once you are discharged, as it is imperative that you return to your primary care physician (or establish a relationship with a primary care physician if you do not have one) for your aftercare needs so that they can reassess your need for medications and monitor your lab values. Today   SUBJECTIVE   Doing well  VITAL SIGNS:  Blood pressure 125/86, pulse 80, temperature 98.7 F (37.1 C), resp. rate 20, height 5\' 10"  (1.778 m), weight 95.5 kg (210 lb 9.6 oz), SpO2 98 %.  I/O:   Intake/Output Summary (Last 24 hours) at 06/25/16 1006 Last data filed at 06/25/16 0347  Gross per  24 hour  Intake             1320 ml  Output                0 ml  Net             1320 ml    PHYSICAL EXAMINATION:  GENERAL:  71 y.o.-year-old patient lying in the bed with no acute distress.  EYES: Pupils equal, round, reactive to light and accommodation. + scleral icterus. Extraocular muscles intact.  HEENT: Head atraumatic, normocephalic. Oropharynx and nasopharynx clear.  NECK:  Supple, no jugular venous distention. No thyroid enlargement, no tenderness.  LUNGS: Normal breath sounds bilaterally, no wheezing, rales,rhonchi or crepitation. No use of accessory muscles of respiration.  CARDIOVASCULAR: S1, S2 normal. No murmurs, rubs, or gallops.  ABDOMEN: Soft, non-tender, + distended. Bowel sounds present. No organomegaly or mass.  EXTREMITIES: No pedal edema, cyanosis, or clubbing.  NEUROLOGIC: Cranial nerves II through XII are intact. Muscle strength 5/5 in all extremities. Sensation intact. Gait  not checked.  PSYCHIATRIC: The patient is alert and oriented x 3.  SKIN: No obvious rash, lesion, or ulcer.   DATA REVIEW:   CBC   Recent Labs Lab 06/24/16 0552  WBC 5.1  HGB 9.8*  HCT 29.1*  PLT 107*    Chemistries   Recent Labs Lab 06/24/16 0552  NA 136  K 3.5  CL 105  CO2 25  GLUCOSE 94  BUN 9  CREATININE 0.91  CALCIUM 8.0*  AST 53*  ALT 15*  ALKPHOS 73  BILITOT 1.6*    Microbiology Results   No results found for this or any previous visit (from the past 240 hour(s)).  RADIOLOGY:  Dg Sacrum/coccyx  Result Date: 06/23/2016 CLINICAL DATA:  Fall with acute sacral and coccygeal pain. Initial encounter. EXAM: SACRUM AND COCCYX - 2+ VIEW COMPARISON:  06/06/2016 radiographs FINDINGS: There is no evidence of fracture or other focal bone lesions. IMPRESSION: Negative. Electronically Signed   By: Harmon PierJeffrey  Hu M.D.   On: 06/23/2016 19:01   [place] Paracentesis  Result Date: 06/25/2016 INDICATION: 71 year old male with recurrent large volume ascites. EXAM: ULTRASOUND GUIDED   PARACENTESIS MEDICATIONS: None. COMPLICATIONS: None immediate. PROCEDURE: Informed written consent was obtained from the patient after a discussion of the risks, benefits and alternatives to treatment. A timeout was performed prior to the initiation of the procedure. Initial ultrasound scanning demonstrates a large amount of ascites within the right lower abdominal quadrant. The right lower abdomen was prepped and draped in the usual sterile fashion. 1% lidocaine with epinephrine was used for local anesthesia. Following this, a 6 Fr Safe-T-Centesis catheter was introduced. An ultrasound image was saved for documentation purposes. The paracentesis was performed. The catheter was removed and a dressing was applied. The patient tolerated the procedure well without immediate post procedural complication. FINDINGS: A total of approximately 5000 mL of clear yellow ascitic fluid was removed. Samples were sent to the laboratory as requested by the clinical team. IMPRESSION: Successful ultrasound-guided paracentesis yielding 5 liters of peritoneal fluid. Electronically Signed   By: Malachy MoanHeath  McCullough M.D.   On: 06/25/2016 08:32     Management plans discussed with the patient, family and they are in agreement.  CODE STATUS:     Code Status Orders        Start     Ordered   06/23/16 2354  Full code  Continuous     06/23/16 2353    Code Status History    Date Active Date Inactive Code Status Order ID Comments User Context   06/18/2016  9:53 PM 06/19/2016  5:37 PM DNR [id number]  Altamese DillingVachhani, Vaibhavkumar, MD Inpatient   12/27/2015  3:37 PM 12/30/2015 10:03 PM Full Code [id number]  Adrian SaranMody, Sital, MD Inpatient   05/19/2015  7:43 PM 05/20/2015  6:19 PM Full Code [id number]  Houston SirenSainani, Vivek J, MD Inpatient    Advance Directive Documentation     Most Recent Value  Type of Advance Directive  Healthcare Power of Attorney, Living will  Pre-existing out of facility DNR order (yellow form or pink MOST form)  -  "MOST" Form  in Place?  -      TOTAL TIME TAKING CARE OF THIS PATIENT: *40* minutes.    Cintia Gleed M.D on 06/25/2016 at 10:06 AM  Between 7am to 6pm - Pager - [phone] After 6pm go to [email or web address] - Social research officer, governmentpassword [email or web address]  [facility] [place] [facility]  Office  [phone]  CC: Primary care physician; [name], [name], MD

## 2016-06-28 ENCOUNTER — Ambulatory Visit: Payer: Medicare HMO

## 2016-06-28 DIAGNOSIS — K7030 Alcoholic cirrhosis of liver without ascites: Secondary | ICD-10-CM | POA: Diagnosis not present

## 2016-06-28 DIAGNOSIS — R45851 Suicidal ideations: Secondary | ICD-10-CM | POA: Diagnosis not present

## 2016-06-28 DIAGNOSIS — F10129 Alcohol abuse with intoxication, unspecified: Secondary | ICD-10-CM | POA: Diagnosis not present

## 2016-06-28 DIAGNOSIS — K7210 Chronic hepatic failure without coma: Secondary | ICD-10-CM | POA: Diagnosis not present

## 2016-06-28 NOTE — Discharge Summary (Signed)
[facility] - [place] at [facility]   PATIENT NAME: Lawrence West    MR#:  [id number]  DATE OF BIRTH:  Oct 31, 1945  DATE OF ADMISSION:  06/18/2016 ADMITTING PHYSICIAN: Altamese Dilling, MD  DATE OF DISCHARGE: 06/19/2016  2:24 PM  PRIMARY CARE PHYSICIAN: Kandyce Rud, MD   ADMISSION DIAGNOSIS:  Acute hepatic encephalopathy [K72.00]  DISCHARGE DIAGNOSIS:  Principal Problem:   Confusion Active Problems:   Acute hepatic encephalopathy   Fall at home, initial encounter   Hepatic encephalopathy (HCC)   SECONDARY DIAGNOSIS:   Past Medical History:  Diagnosis Date  . Arthritis    "left knee" (07/08/2015)  . Ascites   . Avascular necrosis of bone of left hip (HCC) 07/08/2015  . Brown recluse spider bite 1990s   "they thought I was gonna die then"  . Cirrhosis of liver (HCC)    Sees Dr. Sung Amabile  . Depression   . Edema of both legs    Takes Lasix  . Gout X 1  . High cholesterol   . Hypertension   . Narcolepsy   . Rheumatic fever   . Sleep apnea    pt stated "I have sleep apnea but I do not use the machine...Marland KitchenMarland KitchenMarland Kitchenit broke" (07/08/2015)     ADMITTING HISTORY  HISTORY OF PRESENT ILLNESS: Lawrence West  is a 71 y.o. male with a known history of Alcoholic liver cirrhosis, depression, high cholesterol, hypertension, sleep apnea, arthritis- lives alone at home with a caretaker, had a fall 2 weeks ago and came to ER and found no acute injuries. Since then he continues to have pain in his back and having some more imbalance. He is also concerned about his regular use of lactulose and diuretics. He has 8-10 bowel movements every day and he has to visit bathroom frequently for urination also. That keeps him from getting good sleep at night and getting his other social activities. Concerned with this for last 3-4 days he did not take any lactulose. He was noted slightly confused by his caretaker and he also had complain of this pain, since he fell down so he  decided to bring him to emergency room. Patient is more oriented now as per the caretaker who is present in the room, ER physician suggested to admit for hepatitic encephalopathy. 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 Mechele Collin in office, and he is scheduled to have paracentesis every week as outpatient which is done today.  HOSPITAL COURSE:   * Hepatic encephalopathy due to noncompliance with lactulose. This was restarted. Patient's ammonia level is chronically elevated but clinically he has returned to baseline. Has chronic weakness. Alert and oriented 3 and able to make his own decisions. Overall poor prognosis due to advanced liver disease and noncompliance with medications and follow-up.  * Chronic ascites due to cirrhosis. Had paracentesis done with symptomatic relief. No abdominal pain. Afebrile.  * Chronic back pain. Continue home pain medications.  Patient is stable for discharge but high risk for readmissions  CONSULTS OBTAINED:    DRUG ALLERGIES:  No Known Allergies  DISCHARGE MEDICATIONS:   Discharge Medication List as of 06/19/2016 12:17 PM    START taking these medications   Details  rifaximin (XIFAXAN) 550 MG TABS tablet Take 1 tablet (550 mg total) by mouth 2 (two) times daily., Starting Sat 06/19/2016, Normal      CONTINUE these medications which have NOT CHANGED   Details  atorvastatin (LIPITOR) 10 MG tablet Take  1 tablet (10 mg total) by mouth daily., Starting Tue 05/20/2015, Normal    bismuth subsalicylate (PEPTO BISMOL) 262 MG/15ML suspension Take 30 mLs by mouth every 6 (six) hours as needed., Historical Med    furosemide (LASIX) 20 MG tablet Take 20 mg by mouth daily., Starting Tue 06/08/2016, Historical Med    lamoTRIgine (LAMICTAL) 25 MG tablet Take 50 mg by mouth daily. , Starting Tue 06/08/2016, Historical Med    losartan (COZAAR) 50 MG tablet Take 1 tablet by mouth daily., Starting Tue 09/02/2015, Until Wed 09/01/2016,  Historical Med    ondansetron (ZOFRAN) 4 MG tablet Take 1 tablet (4 mg total) by mouth every 8 (eight) hours as needed for nausea or vomiting., Starting Tue 07/08/2015, Normal    pantoprazole (PROTONIX) 40 MG tablet Take 1 tablet (40 mg total) by mouth 2 (two) times daily., Starting Tue 12/30/2015, No Print    spironolactone (ALDACTONE) 25 MG tablet Take 1 tablet (25 mg total) by mouth daily., Starting Tue 12/30/2015, No Print    traMADol (ULTRAM) 50 MG tablet Take 1 tablet (50 mg total) by mouth every 6 (six) hours as needed for severe pain., Starting Wed 12/31/2015, Until Thu 12/30/2016, Print    lactulose (CHRONULAC) 10 GM/15ML solution Take 5 g by mouth 2 (two) times daily., Historical Med        Today   VITAL SIGNS:  Blood pressure (!) 154/92, pulse (!) 108, temperature 97.8 F (36.6 C), temperature source Oral, resp. rate 18, height 5\' 10"  (1.778 m), weight 94.4 kg (208 lb 3.2 oz), SpO2 96 %.  I/O:  No intake or output data in the 24 hours ending 06/28/16 1341  PHYSICAL EXAMINATION:  Physical Exam  GENERAL:  71 y.o.-year-old patient lying in the bed with no acute distress.  LUNGS: Normal breath sounds bilaterally, no wheezing, rales,rhonchi or crepitation. No use of accessory muscles of respiration.  CARDIOVASCULAR: S1, S2 normal. No murmurs, rubs, or gallops.  ABDOMEN: Soft, non-tender, non-distended. Bowel sounds present. No organomegaly or mass.  NEUROLOGIC: Moves all 4 extremities. PSYCHIATRIC: The patient is alert and oriented x 3.  SKIN: No obvious rash, lesion, or ulcer.   DATA REVIEW:   CBC  Recent Labs Lab 06/24/16 0552  WBC 5.1  HGB 9.8*  HCT 29.1*  PLT 107*    Chemistries   Recent Labs Lab 06/24/16 0552  NA 136  K 3.5  CL 105  CO2 25  GLUCOSE 94  BUN 9  CREATININE 0.91  CALCIUM 8.0*  AST 53*  ALT 15*  ALKPHOS 73  BILITOT 1.6*    Cardiac Enzymes No results for input(s): TROPONINI in the last 168 hours.  Microbiology Results   Results for orders placed or performed during the hospital encounter of 12/27/15  MRSA PCR Screening     Status: None   Collection Time: 12/27/15  3:27 PM  Result Value Ref Range Status   MRSA by PCR NEGATIVE NEGATIVE Final    Comment:        The GeneXpert MRSA Assay (FDA approved for NASAL specimens only), is one component of a comprehensive MRSA colonization surveillance program. It is not intended to diagnose MRSA infection nor to guide or monitor treatment for MRSA infections.     RADIOLOGY:  No results found.  Follow up with PCP in 1 week.  Management plans discussed with the patient, family and they are in agreement.  CODE STATUS:  Code Status History    Date Active Date Inactive Code Status Order  ID Comments User Context   06/23/2016 11:53 PM 06/25/2016  5:50 PM Full Code [id number]  Houston SirenSainani, Vivek J, MD Inpatient   06/18/2016  9:53 PM 06/19/2016  5:37 PM DNR [id number]  Altamese DillingVachhani, Vaibhavkumar, MD Inpatient   12/27/2015  3:37 PM 12/30/2015 10:03 PM Full Code [id number]  Adrian SaranMody, Sital, MD Inpatient   05/19/2015  7:43 PM 05/20/2015  6:19 PM Full Code [id number]  Houston SirenSainani, Vivek J, MD Inpatient    Advance Directive Documentation     Most Recent Value  Type of Advance Directive  Healthcare Power of Attorney, Living will  Pre-existing out of facility DNR order (yellow form or pink MOST form)  -  "MOST" Form in Place?  -      TOTAL TIME TAKING CARE OF THIS PATIENT ON DAY OF DISCHARGE: more than 30 minutes.   Milagros LollSudini, Yameli Delamater R M.D on 06/28/2016 at 1:41 PM  Between 7am to 6pm - Pager - [phone]  After 6pm go to [email or web address] - password [email or web address] [email or web address]  [facility] [place] [facility]  Office  [phone]  CC: Primary care physician; [name], [name], MD  Note: This dictation was prepared with Dragon dictation along with smaller phrase technology. Any transcriptional errors that result from this process are unintentional.

## 2016-06-29 NOTE — Discharge Instructions (Signed)
Paracentesis, Care After  Refer to this sheet in the next few weeks. These instructions provide you with information about caring for yourself after your procedure. Your health care provider may also give you more specific instructions. Your treatment has been planned according to current medical practices, but problems sometimes occur. Call your health care provider if you have any problems or questions after your procedure.  What can I expect after the procedure?  After your procedure, it is common to have a small amount of clear fluid coming from the puncture site.  Follow these instructions at home:  · Return to your normal activities as told by your health care provider. Ask your health care provider what activities are safe for you.  · Take over-the-counter and prescription medicines only as told by your health care provider.  · Do not take baths, swim, or use a hot tub until your health care provider approves.  · Follow instructions from your health care provider about:  ? How to take care of your puncture site.  ? When and how you should change your bandage (dressing).  ? When you should remove your dressing.  · Check your puncture area every day signs of infection. Watch for:  ? Redness, swelling, or pain.  ? Fluid, blood, or pus.  · Keep all follow-up visits as told by your health care provider. This is important.  Contact a health care provider if:  · You have redness, swelling, or pain at your puncture site.  · You start to have more clear fluid coming from your puncture site.  · You have blood or pus coming from your puncture site.  · You have chills.  · You have a fever.  Get help right away if:  · You develop chest pain or shortness of breath.  · You develop increasing pain, discomfort, or swelling in your abdomen.  · You feel dizzy or light-headed or you pass out.  This information is not intended to replace advice given to you by your health care provider. Make sure you discuss any questions you  have with your health care provider.  Document Released: 06/11/2014 Document Revised: 07/03/2015 Document Reviewed: 04/09/2014  Elsevier Interactive Patient Education © 2017 [employer]

## 2016-07-02 ENCOUNTER — Ambulatory Visit
Admission: RE | Admit: 2016-07-02 | Discharge: 2016-07-02 | Disposition: A | Discharge status: Home / Self Care | Source: Ambulatory Visit | Attending: Family Medicine | Admitting: Family Medicine

## 2016-07-02 DIAGNOSIS — K7031 Alcoholic cirrhosis of liver with ascites: Secondary | ICD-10-CM | POA: Diagnosis not present

## 2016-07-02 MED ORDER — ALBUMIN HUMAN 25 % IV SOLN
25.0000 g | Freq: Once | INTRAVENOUS | Status: DC
  Filled 2016-07-02: qty 100

## 2016-07-05 ENCOUNTER — Ambulatory Visit: Payer: Medicare HMO

## 2016-07-06 DIAGNOSIS — K7031 Alcoholic cirrhosis of liver with ascites: Secondary | ICD-10-CM | POA: Diagnosis not present

## 2016-07-06 DIAGNOSIS — Z79899 Other long term (current) drug therapy: Secondary | ICD-10-CM | POA: Diagnosis not present

## 2016-07-06 DIAGNOSIS — M545 Low back pain: Secondary | ICD-10-CM | POA: Diagnosis not present

## 2016-07-06 DIAGNOSIS — I10 Essential (primary) hypertension: Secondary | ICD-10-CM | POA: Diagnosis not present

## 2016-07-06 DIAGNOSIS — G8911 Acute pain due to trauma: Secondary | ICD-10-CM | POA: Diagnosis not present

## 2016-07-06 DIAGNOSIS — R5381 Other malaise: Secondary | ICD-10-CM | POA: Diagnosis not present

## 2016-07-07 DIAGNOSIS — K7031 Alcoholic cirrhosis of liver with ascites: Secondary | ICD-10-CM | POA: Diagnosis not present

## 2016-07-09 ENCOUNTER — Other Ambulatory Visit: Payer: Self-pay | Admitting: Unknown Physician Specialty

## 2016-07-09 ENCOUNTER — Ambulatory Visit
Admission: RE | Admit: 2016-07-09 | Discharge: 2016-07-09 | Disposition: A | Discharge status: Home / Self Care | Source: Ambulatory Visit | Attending: Family Medicine | Admitting: Family Medicine

## 2016-07-09 DIAGNOSIS — K7031 Alcoholic cirrhosis of liver with ascites: Secondary | ICD-10-CM | POA: Diagnosis not present

## 2016-07-09 DIAGNOSIS — R188 Other ascites: Secondary | ICD-10-CM | POA: Diagnosis not present

## 2016-07-09 MED ORDER — ALBUMIN HUMAN 25 % IV SOLN
25.0000 g | Freq: Once | INTRAVENOUS | Status: AC
  Administered 2016-07-09: 25 g via INTRAVENOUS
  Filled 2016-07-09: qty 100

## 2016-07-09 NOTE — Procedures (Signed)
PROCEDURE SUMMARY:  Successful US guided paracentesis from RLQ.  Yielded 5.6 L of clear yellow fluid.  No immediate complications.  Pt tolerated well.   Specimen was not sent for labs.  Brayton ElBRUNING, Claudetta Sallie PA-C 07/09/2016 3:28 PM

## 2016-07-12 ENCOUNTER — Ambulatory Visit: Payer: Medicare HMO

## 2016-07-16 ENCOUNTER — Other Ambulatory Visit: Payer: Self-pay | Admitting: Unknown Physician Specialty

## 2016-07-16 ENCOUNTER — Ambulatory Visit
Admission: RE | Admit: 2016-07-16 | Discharge: 2016-07-16 | Disposition: A | Discharge status: Home / Self Care | Source: Ambulatory Visit | Attending: Family Medicine | Admitting: Family Medicine

## 2016-07-16 ENCOUNTER — Other Ambulatory Visit: Payer: Self-pay | Admitting: Internal Medicine

## 2016-07-16 DIAGNOSIS — K7031 Alcoholic cirrhosis of liver with ascites: Secondary | ICD-10-CM

## 2016-07-16 DIAGNOSIS — R188 Other ascites: Secondary | ICD-10-CM | POA: Diagnosis not present

## 2016-07-16 MED ORDER — ALBUMIN HUMAN 25 % IV SOLN
25.0000 g | Freq: Once | INTRAVENOUS | Status: AC
  Administered 2016-07-16: 25 g via INTRAVENOUS
  Filled 2016-07-16: qty 100

## 2016-07-16 NOTE — Procedures (Signed)
PROCEDURE SUMMARY:  Successful US guided paracentesis from right lateral abdomen.  Yielded 5.8 liters of clear yellow fluid.  No immediate complications.  Pt tolerated well.   Meliana Canner S Devion Chriscoe PA-C 07/16/2016 3:11 PM

## 2016-07-19 ENCOUNTER — Ambulatory Visit: Payer: Medicare HMO

## 2016-07-23 ENCOUNTER — Ambulatory Visit
Admission: RE | Admit: 2016-07-23 | Discharge: 2016-07-23 | Disposition: A | Discharge status: Home / Self Care | Source: Ambulatory Visit | Attending: Family Medicine | Admitting: Family Medicine

## 2016-07-23 DIAGNOSIS — R188 Other ascites: Secondary | ICD-10-CM | POA: Diagnosis not present

## 2016-07-23 DIAGNOSIS — K7031 Alcoholic cirrhosis of liver with ascites: Secondary | ICD-10-CM

## 2016-07-23 MED ORDER — ALBUMIN HUMAN 25 % IV SOLN
25.0000 g | Freq: Once | INTRAVENOUS | Status: AC
  Administered 2016-07-23: 25 g via INTRAVENOUS
  Filled 2016-07-23: qty 100

## 2016-07-23 MED ORDER — ALBUMIN HUMAN 25 % IV SOLN
INTRAVENOUS | Status: AC
  Administered 2016-07-23: 25 g via INTRAVENOUS
  Filled 2016-07-23: qty 100

## 2016-07-23 NOTE — Procedures (Signed)
PROCEDURE SUMMARY:  Successful US guided paracentesis from RLQ.  Yielded 3.6 L of clear yellow fluid.  No immediate complications.  Pt tolerated well.   Specimen was not sent for labs.  Brayton ElBRUNING, Jaime Grizzell PA-C 07/23/2016 3:20 PM

## 2016-07-26 ENCOUNTER — Ambulatory Visit: Payer: Medicare HMO

## 2016-07-30 ENCOUNTER — Ambulatory Visit
Admission: RE | Admit: 2016-07-30 | Discharge: 2016-07-30 | Disposition: A | Discharge status: Home / Self Care | Source: Ambulatory Visit | Attending: Unknown Physician Specialty | Admitting: Unknown Physician Specialty

## 2016-07-30 DIAGNOSIS — R188 Other ascites: Secondary | ICD-10-CM | POA: Diagnosis not present

## 2016-07-30 DIAGNOSIS — K7031 Alcoholic cirrhosis of liver with ascites: Secondary | ICD-10-CM

## 2016-07-30 MED ORDER — ALBUMIN HUMAN 25 % IV SOLN
INTRAVENOUS | Status: AC
  Administered 2016-07-30: 14:00:00
  Filled 2016-07-30: qty 100

## 2016-08-02 ENCOUNTER — Ambulatory Visit: Payer: Medicare HMO

## 2016-08-06 ENCOUNTER — Ambulatory Visit
Admission: RE | Admit: 2016-08-06 | Discharge: 2016-08-06 | Disposition: A | Discharge status: Home / Self Care | Source: Ambulatory Visit | Attending: Unknown Physician Specialty | Admitting: Unknown Physician Specialty

## 2016-08-06 DIAGNOSIS — R233 Spontaneous ecchymoses: Secondary | ICD-10-CM | POA: Insufficient documentation

## 2016-08-06 DIAGNOSIS — R188 Other ascites: Secondary | ICD-10-CM | POA: Insufficient documentation

## 2016-08-06 DIAGNOSIS — K7031 Alcoholic cirrhosis of liver with ascites: Secondary | ICD-10-CM

## 2016-08-06 MED ORDER — ALBUMIN HUMAN 25 % IV SOLN
INTRAVENOUS | Status: AC
  Administered 2016-08-06: 25 g
  Filled 2016-08-06: qty 100

## 2016-08-06 NOTE — Progress Notes (Signed)
Patient's gum had started to bleed at discharge after albumin post-parcentesis.  Some petechia noted on face.  Patient to call home health nurse today to come out-possibly to draw labs.  Dr. Earnest ConroyElliott's office notified.  Possible blood work in a week when returns for weekly paracentesis.

## 2016-08-06 NOTE — Procedures (Signed)
US guided paracentesis EBL 0 Comp 0 

## 2016-08-09 ENCOUNTER — Ambulatory Visit: Payer: Medicare HMO

## 2016-08-12 NOTE — Discharge Instructions (Signed)
Paracentesis, Care After °Refer to this sheet in the next few weeks. These instructions provide you with information about caring for yourself after your procedure. Your health care provider may also give you more specific instructions. Your treatment has been planned according to current medical practices, but problems sometimes occur. Call your health care provider if you have any problems or questions after your procedure. °What can I expect after the procedure? °After your procedure, it is common to have a small amount of clear fluid coming from the puncture site. °Follow these instructions at home: °· Return to your normal activities as told by your health care provider. Ask your health care provider what activities are safe for you. °· Take over-the-counter and prescription medicines only as told by your health care provider. °· Do not take baths, swim, or use a hot tub until your health care provider approves. °· Follow instructions from your health care provider about: °? How to take care of your puncture site. °? When and how you should change your bandage (dressing). °? When you should remove your dressing. °· Check your puncture area every day signs of infection. Watch for: °? Redness, swelling, or pain. °? Fluid, blood, or pus. °· Keep all follow-up visits as told by your health care provider. This is important. °Contact a health care provider if: °· You have redness, swelling, or pain at your puncture site. °· You start to have more clear fluid coming from your puncture site. °· You have blood or pus coming from your puncture site. °· You have chills. °· You have a fever. °Get help right away if: °· You develop chest pain or shortness of breath. °· You develop increasing pain, discomfort, or swelling in your abdomen. °· You feel dizzy or light-headed or you pass out. °This information is not intended to replace advice given to you by your health care provider. Make sure you discuss any questions you  have with your health care provider. °Document Released: 06/11/2014 Document Revised: 07/03/2015 Document Reviewed: 04/09/2014 °Elsevier Interactive Patient Education © 2018 [employer] ° °

## 2016-08-13 ENCOUNTER — Ambulatory Visit
Admission: RE | Admit: 2016-08-13 | Discharge: 2016-08-13 | Disposition: A | Discharge status: Home / Self Care | Source: Ambulatory Visit | Attending: Unknown Physician Specialty | Admitting: Unknown Physician Specialty

## 2016-08-13 DIAGNOSIS — K7031 Alcoholic cirrhosis of liver with ascites: Secondary | ICD-10-CM | POA: Diagnosis not present

## 2016-08-13 MED ORDER — ALBUMIN HUMAN 25 % IV SOLN
INTRAVENOUS | Status: AC
  Administered 2016-08-13: 25 g via INTRAVENOUS
  Filled 2016-08-13: qty 100

## 2016-08-13 MED ORDER — ALBUMIN HUMAN 25 % IV SOLN
25.0000 g | Freq: Once | INTRAVENOUS | Status: AC
  Administered 2016-08-13: 25 g via INTRAVENOUS
  Filled 2016-08-13: qty 100

## 2016-08-13 NOTE — Procedures (Signed)
PROCEDURE SUMMARY:  Successful US guided paracentesis from right lateral abdomen.  Yielded 5.0 liters of clear, yellow fluid.  No immediate complications.  Pt tolerated well.   Specimen was not sent for labs.  Hoyt KochKacie Sue-Ellen Matthews PA-C 08/13/2016 3:34 PM

## 2016-08-16 ENCOUNTER — Ambulatory Visit: Payer: Medicare HMO

## 2016-08-20 ENCOUNTER — Ambulatory Visit
Admission: RE | Admit: 2016-08-20 | Discharge: 2016-08-20 | Disposition: A | Discharge status: Home / Self Care | Payer: Medicare Other | Source: Ambulatory Visit | Attending: Unknown Physician Specialty | Admitting: Unknown Physician Specialty

## 2016-08-20 DIAGNOSIS — K7031 Alcoholic cirrhosis of liver with ascites: Secondary | ICD-10-CM | POA: Insufficient documentation

## 2016-08-20 DIAGNOSIS — R188 Other ascites: Secondary | ICD-10-CM | POA: Diagnosis present

## 2016-08-20 MED ORDER — ALBUMIN HUMAN 25 % IV SOLN
25.0000 g | Freq: Once | INTRAVENOUS | Status: AC
  Administered 2016-08-20: 25 g via INTRAVENOUS
  Filled 2016-08-20: qty 100

## 2016-08-20 MED ORDER — ALBUMIN HUMAN 25 % IV SOLN
INTRAVENOUS | Status: AC
  Administered 2016-08-20: 25 g via INTRAVENOUS
  Filled 2016-08-20: qty 100

## 2016-08-20 NOTE — Procedures (Signed)
PROCEDURE SUMMARY:  Successful US guided paracentesis from right lateral abdomen.  Yielded 7 liters of clear yellow fluid.  No immediate complications.  Pt tolerated well.    Kadence Mikkelson S Noheli Melder PA-C 08/20/2016 4:01 PM

## 2016-08-23 ENCOUNTER — Ambulatory Visit: Payer: Medicare HMO

## 2016-08-27 ENCOUNTER — Ambulatory Visit
Admission: RE | Admit: 2016-08-27 | Discharge: 2016-08-27 | Disposition: A | Discharge status: Home / Self Care | Source: Ambulatory Visit | Attending: Unknown Physician Specialty | Admitting: Unknown Physician Specialty

## 2016-08-27 DIAGNOSIS — K7031 Alcoholic cirrhosis of liver with ascites: Secondary | ICD-10-CM | POA: Diagnosis not present

## 2016-08-27 MED ORDER — ALBUMIN HUMAN 25 % IV SOLN
INTRAVENOUS | Status: AC
  Administered 2016-08-27: 25 g
  Filled 2016-08-27: qty 100

## 2016-08-27 NOTE — Procedures (Signed)
PROCEDURE SUMMARY:  Successful US guided paracentesis from RLQ.  Yielded 6.8L of clear yellow fluid.  No immediate complications.  Pt tolerated well.   Specimen was not sent for labs.  Brayton ElBRUNING, Vibhav Waddill PA-C 08/27/2016 3:00 PM

## 2016-08-30 ENCOUNTER — Ambulatory Visit: Payer: Medicare HMO

## 2016-09-03 ENCOUNTER — Ambulatory Visit
Admission: RE | Admit: 2016-09-03 | Discharge: 2016-09-03 | Disposition: A | Discharge status: Home / Self Care | Source: Ambulatory Visit | Attending: Unknown Physician Specialty | Admitting: Unknown Physician Specialty

## 2016-09-03 DIAGNOSIS — K7031 Alcoholic cirrhosis of liver with ascites: Secondary | ICD-10-CM | POA: Insufficient documentation

## 2016-09-03 MED ORDER — ALBUMIN HUMAN 25 % IV SOLN
INTRAVENOUS | Status: AC
  Filled 2016-09-03: qty 100

## 2016-09-03 NOTE — Procedures (Signed)
Ultrasound-guided therapeutic paracentesis performed yielding 7 liters of yellow fluid. No immediate complications.  

## 2016-09-06 ENCOUNTER — Ambulatory Visit: Payer: Medicare HMO

## 2016-09-10 ENCOUNTER — Ambulatory Visit
Admission: RE | Admit: 2016-09-10 | Discharge: 2016-09-10 | Disposition: A | Discharge status: Home / Self Care | Payer: Medicare Other | Source: Ambulatory Visit | Attending: Unknown Physician Specialty | Admitting: Unknown Physician Specialty

## 2016-09-10 DIAGNOSIS — K7031 Alcoholic cirrhosis of liver with ascites: Secondary | ICD-10-CM | POA: Diagnosis not present

## 2016-09-10 MED ORDER — ALBUMIN HUMAN 25 % IV SOLN
INTRAVENOUS | Status: AC
  Administered 2016-09-10: 12.5 g via INTRAVENOUS
  Filled 2016-09-10: qty 100

## 2016-09-10 MED ORDER — ALBUMIN HUMAN 25 % IV SOLN
12.5000 g | Freq: Once | INTRAVENOUS | Status: AC
  Administered 2016-09-10: 12.5 g via INTRAVENOUS
  Filled 2016-09-10: qty 50

## 2016-09-13 ENCOUNTER — Ambulatory Visit: Payer: Medicare HMO

## 2016-09-13 DIAGNOSIS — K7031 Alcoholic cirrhosis of liver with ascites: Secondary | ICD-10-CM | POA: Diagnosis not present

## 2016-09-17 ENCOUNTER — Ambulatory Visit
Admission: RE | Admit: 2016-09-17 | Discharge: 2016-09-17 | Disposition: A | Discharge status: Home / Self Care | Source: Ambulatory Visit | Attending: Unknown Physician Specialty | Admitting: Unknown Physician Specialty

## 2016-09-17 DIAGNOSIS — K7031 Alcoholic cirrhosis of liver with ascites: Secondary | ICD-10-CM | POA: Diagnosis not present

## 2016-09-17 MED ORDER — ALBUMIN HUMAN 25 % IV SOLN
25.0000 g | Freq: Once | INTRAVENOUS | Status: AC
  Administered 2016-09-17: 25 g via INTRAVENOUS

## 2016-09-17 MED ORDER — ALBUMIN HUMAN 25 % IV SOLN
INTRAVENOUS | Status: AC
  Administered 2016-09-17: 25 g via INTRAVENOUS
  Filled 2016-09-17: qty 100

## 2016-09-17 NOTE — Procedures (Signed)
PROCEDURE SUMMARY:  Successful US guided paracentesis from right lateral abdomen.  Yielded 6.8 liters of clear yellow fluid.  No immediate complications.  Pt tolerated well.   Arron Mcnaught S Jerrin Recore PA-C 09/17/2016 3:18 PM

## 2016-09-20 ENCOUNTER — Ambulatory Visit: Payer: Medicare HMO

## 2016-09-24 ENCOUNTER — Ambulatory Visit
Admission: RE | Admit: 2016-09-24 | Discharge: 2016-09-24 | Disposition: A | Discharge status: Home / Self Care | Source: Ambulatory Visit | Attending: Unknown Physician Specialty | Admitting: Unknown Physician Specialty

## 2016-09-24 DIAGNOSIS — K7031 Alcoholic cirrhosis of liver with ascites: Secondary | ICD-10-CM

## 2016-09-24 MED ORDER — ALBUMIN HUMAN 25 % IV SOLN
25.0000 g | Freq: Once | INTRAVENOUS | Status: AC
  Administered 2016-09-24: 25 g via INTRAVENOUS

## 2016-09-24 MED ORDER — ALBUMIN HUMAN 25 % IV SOLN
INTRAVENOUS | Status: AC
  Administered 2016-09-24: 25 g via INTRAVENOUS
  Filled 2016-09-24: qty 100

## 2016-09-24 NOTE — Procedures (Signed)
Successful US guided paracentesis.  No immediate complication.  Minimal blood loss.  See full report in Imaging.

## 2016-09-27 ENCOUNTER — Ambulatory Visit: Payer: Medicare HMO

## 2016-10-01 ENCOUNTER — Other Ambulatory Visit: Payer: Self-pay | Admitting: Unknown Physician Specialty

## 2016-10-01 ENCOUNTER — Ambulatory Visit
Admission: RE | Admit: 2016-10-01 | Discharge: 2016-10-01 | Disposition: A | Discharge status: Home / Self Care | Source: Ambulatory Visit | Attending: Unknown Physician Specialty | Admitting: Unknown Physician Specialty

## 2016-10-01 DIAGNOSIS — K7031 Alcoholic cirrhosis of liver with ascites: Secondary | ICD-10-CM | POA: Insufficient documentation

## 2016-10-01 MED ORDER — ALBUMIN HUMAN 25 % IV SOLN
25.0000 g | Freq: Once | INTRAVENOUS | Status: AC
  Administered 2016-10-01: 25 g via INTRAVENOUS
  Filled 2016-10-01: qty 100

## 2016-10-01 MED ORDER — ALBUMIN HUMAN 25 % IV SOLN
INTRAVENOUS | Status: AC
  Administered 2016-10-01: 25 g via INTRAVENOUS
  Filled 2016-10-01: qty 100

## 2016-10-01 NOTE — Discharge Instructions (Signed)
Paracentesis, Care After °Refer to this sheet in the next few weeks. These instructions provide you with information about caring for yourself after your procedure. Your health care provider may also give you more specific instructions. Your treatment has been planned according to current medical practices, but problems sometimes occur. Call your health care provider if you have any problems or questions after your procedure. °What can I expect after the procedure? °After your procedure, it is common to have a small amount of clear fluid coming from the puncture site. °Follow these instructions at home: °· Return to your normal activities as told by your health care provider. Ask your health care provider what activities are safe for you. °· Take over-the-counter and prescription medicines only as told by your health care provider. °· Do not take baths, swim, or use a hot tub until your health care provider approves. °· Follow instructions from your health care provider about: °? How to take care of your puncture site. °? When and how you should change your bandage (dressing). °? When you should remove your dressing. °· Check your puncture area every day signs of infection. Watch for: °? Redness, swelling, or pain. °? Fluid, blood, or pus. °· Keep all follow-up visits as told by your health care provider. This is important. °Contact a health care provider if: °· You have redness, swelling, or pain at your puncture site. °· You start to have more clear fluid coming from your puncture site. °· You have blood or pus coming from your puncture site. °· You have chills. °· You have a fever. °Get help right away if: °· You develop chest pain or shortness of breath. °· You develop increasing pain, discomfort, or swelling in your abdomen. °· You feel dizzy or light-headed or you pass out. °This information is not intended to replace advice given to you by your health care provider. Make sure you discuss any questions you  have with your health care provider. °Document Released: 06/11/2014 Document Revised: 07/03/2015 Document Reviewed: 04/09/2014 °Elsevier Interactive Patient Education © 2018 [employer] ° °

## 2016-10-01 NOTE — Procedures (Signed)
US paracentesis without dfificulty  Complications:  None  Blood Loss: none  See dictation in canopy pacs

## 2016-10-04 ENCOUNTER — Ambulatory Visit: Payer: Medicare HMO

## 2016-10-04 NOTE — Discharge Instructions (Signed)
Paracentesis, Care After °Refer to this sheet in the next few weeks. These instructions provide you with information about caring for yourself after your procedure. Your health care provider may also give you more specific instructions. Your treatment has been planned according to current medical practices, but problems sometimes occur. Call your health care provider if you have any problems or questions after your procedure. °What can I expect after the procedure? °After your procedure, it is common to have a small amount of clear fluid coming from the puncture site. °Follow these instructions at home: °· Return to your normal activities as told by your health care provider. Ask your health care provider what activities are safe for you. °· Take over-the-counter and prescription medicines only as told by your health care provider. °· Do not take baths, swim, or use a hot tub until your health care provider approves. °· Follow instructions from your health care provider about: °? How to take care of your puncture site. °? When and how you should change your bandage (dressing). °? When you should remove your dressing. °· Check your puncture area every day signs of infection. Watch for: °? Redness, swelling, or pain. °? Fluid, blood, or pus. °· Keep all follow-up visits as told by your health care provider. This is important. °Contact a health care provider if: °· You have redness, swelling, or pain at your puncture site. °· You start to have more clear fluid coming from your puncture site. °· You have blood or pus coming from your puncture site. °· You have chills. °· You have a fever. °Get help right away if: °· You develop chest pain or shortness of breath. °· You develop increasing pain, discomfort, or swelling in your abdomen. °· You feel dizzy or light-headed or you pass out. °This information is not intended to replace advice given to you by your health care provider. Make sure you discuss any questions you  have with your health care provider. °Document Released: 06/11/2014 Document Revised: 07/03/2015 Document Reviewed: 04/09/2014 °Elsevier Interactive Patient Education © 2018 [employer] ° °

## 2016-10-07 ENCOUNTER — Other Ambulatory Visit: Payer: Self-pay | Admitting: Unknown Physician Specialty

## 2016-10-07 DIAGNOSIS — Z79899 Other long term (current) drug therapy: Secondary | ICD-10-CM | POA: Diagnosis not present

## 2016-10-07 DIAGNOSIS — K7031 Alcoholic cirrhosis of liver with ascites: Secondary | ICD-10-CM

## 2016-10-07 DIAGNOSIS — K219 Gastro-esophageal reflux disease without esophagitis: Secondary | ICD-10-CM | POA: Diagnosis not present

## 2016-10-07 DIAGNOSIS — F419 Anxiety disorder, unspecified: Secondary | ICD-10-CM | POA: Diagnosis not present

## 2016-10-07 DIAGNOSIS — E7800 Pure hypercholesterolemia, unspecified: Secondary | ICD-10-CM | POA: Diagnosis not present

## 2016-10-08 ENCOUNTER — Ambulatory Visit
Admission: RE | Admit: 2016-10-08 | Discharge: 2016-10-08 | Disposition: A | Discharge status: Home / Self Care | Source: Ambulatory Visit | Attending: Unknown Physician Specialty | Admitting: Unknown Physician Specialty

## 2016-10-08 DIAGNOSIS — K7031 Alcoholic cirrhosis of liver with ascites: Secondary | ICD-10-CM | POA: Insufficient documentation

## 2016-10-08 MED ORDER — ALBUMIN HUMAN 25 % IV SOLN
25.0000 g | Freq: Once | INTRAVENOUS | Status: AC
  Administered 2016-10-08: 25 g via INTRAVENOUS
  Filled 2016-10-08: qty 100

## 2016-10-08 MED ORDER — ALBUMIN HUMAN 25 % IV SOLN
INTRAVENOUS | Status: AC
  Filled 2016-10-08: qty 100

## 2016-10-08 NOTE — Procedures (Signed)
US guided paracentesis.  Removed 7.2 liters.  Minimal blood loss and no immediate complication.

## 2016-10-12 NOTE — Discharge Instructions (Signed)
Paracentesis, Care After °Refer to this sheet in the next few weeks. These instructions provide you with information about caring for yourself after your procedure. Your health care provider may also give you more specific instructions. Your treatment has been planned according to current medical practices, but problems sometimes occur. Call your health care provider if you have any problems or questions after your procedure. °What can I expect after the procedure? °After your procedure, it is common to have a small amount of clear fluid coming from the puncture site. °Follow these instructions at home: °· Return to your normal activities as told by your health care provider. Ask your health care provider what activities are safe for you. °· Take over-the-counter and prescription medicines only as told by your health care provider. °· Do not take baths, swim, or use a hot tub until your health care provider approves. °· Follow instructions from your health care provider about: °? How to take care of your puncture site. °? When and how you should change your bandage (dressing). °? When you should remove your dressing. °· Check your puncture area every day signs of infection. Watch for: °? Redness, swelling, or pain. °? Fluid, blood, or pus. °· Keep all follow-up visits as told by your health care provider. This is important. °Contact a health care provider if: °· You have redness, swelling, or pain at your puncture site. °· You start to have more clear fluid coming from your puncture site. °· You have blood or pus coming from your puncture site. °· You have chills. °· You have a fever. °Get help right away if: °· You develop chest pain or shortness of breath. °· You develop increasing pain, discomfort, or swelling in your abdomen. °· You feel dizzy or light-headed or you pass out. °This information is not intended to replace advice given to you by your health care provider. Make sure you discuss any questions you  have with your health care provider. °Document Released: 06/11/2014 Document Revised: 07/03/2015 Document Reviewed: 04/09/2014 °Elsevier Interactive Patient Education © 2018 [employer] ° °

## 2016-10-15 ENCOUNTER — Ambulatory Visit
Admission: RE | Admit: 2016-10-15 | Discharge: 2016-10-15 | Disposition: A | Discharge status: Home / Self Care | Source: Ambulatory Visit | Attending: Unknown Physician Specialty | Admitting: Unknown Physician Specialty

## 2016-10-15 DIAGNOSIS — K7031 Alcoholic cirrhosis of liver with ascites: Secondary | ICD-10-CM | POA: Insufficient documentation

## 2016-10-15 MED ORDER — ALBUMIN HUMAN 25 % IV SOLN
INTRAVENOUS | Status: AC
  Administered 2016-10-15: 14:00:00
  Filled 2016-10-15: qty 100

## 2016-10-15 NOTE — Procedures (Signed)
Pre Procedural Dx: Symptomatic Ascites Post Procedural Dx: Same  Successful US guided paracentesis yielding 7.7 L of serous ascitic fluid.  EBL: None Complications: None immediate  Jay Amandeep Hogston, MD Pager #: [phone]   

## 2016-10-18 NOTE — Discharge Instructions (Signed)
Paracentesis, Care After °Refer to this sheet in the next few weeks. These instructions provide you with information about caring for yourself after your procedure. Your health care provider may also give you more specific instructions. Your treatment has been planned according to current medical practices, but problems sometimes occur. Call your health care provider if you have any problems or questions after your procedure. °What can I expect after the procedure? °After your procedure, it is common to have a small amount of clear fluid coming from the puncture site. °Follow these instructions at home: °· Return to your normal activities as told by your health care provider. Ask your health care provider what activities are safe for you. °· Take over-the-counter and prescription medicines only as told by your health care provider. °· Do not take baths, swim, or use a hot tub until your health care provider approves. °· Follow instructions from your health care provider about: °? How to take care of your puncture site. °? When and how you should change your bandage (dressing). °? When you should remove your dressing. °· Check your puncture area every day signs of infection. Watch for: °? Redness, swelling, or pain. °? Fluid, blood, or pus. °· Keep all follow-up visits as told by your health care provider. This is important. °Contact a health care provider if: °· You have redness, swelling, or pain at your puncture site. °· You start to have more clear fluid coming from your puncture site. °· You have blood or pus coming from your puncture site. °· You have chills. °· You have a fever. °Get help right away if: °· You develop chest pain or shortness of breath. °· You develop increasing pain, discomfort, or swelling in your abdomen. °· You feel dizzy or light-headed or you pass out. °This information is not intended to replace advice given to you by your health care provider. Make sure you discuss any questions you  have with your health care provider. °Document Released: 06/11/2014 Document Revised: 07/03/2015 Document Reviewed: 04/09/2014 °Elsevier Interactive Patient Education © 2018 [employer] ° °

## 2016-10-21 ENCOUNTER — Ambulatory Visit
Admission: RE | Admit: 2016-10-21 | Discharge: 2016-10-21 | Disposition: A | Discharge status: Home / Self Care | Source: Ambulatory Visit | Attending: Unknown Physician Specialty | Admitting: Unknown Physician Specialty

## 2016-10-21 DIAGNOSIS — K7031 Alcoholic cirrhosis of liver with ascites: Secondary | ICD-10-CM | POA: Insufficient documentation

## 2016-10-21 MED ORDER — ALBUMIN HUMAN 25 % IV SOLN
INTRAVENOUS | Status: AC
  Administered 2016-10-21: 25 g
  Filled 2016-10-21: qty 100

## 2016-10-21 NOTE — Procedures (Signed)
Pre Procedural Dx: Symptomatic Ascites Post Procedural Dx: Same  Successful US guided paracentesis yielding 6.5 L of serous ascitic fluid. Sample sent to laboratory as requested.  EBL: None  Complications: None immediate  Katherina RightJay Nyimah Shadduck, MD Pager #: [phone]

## 2016-10-22 ENCOUNTER — Ambulatory Visit
Admission: RE | Admit: 2016-10-22 | Discharge: 2016-10-22 | Disposition: A | Discharge status: Home / Self Care | Source: Ambulatory Visit | Attending: Unknown Physician Specialty | Admitting: Unknown Physician Specialty

## 2016-10-26 NOTE — Discharge Instructions (Signed)
Paracentesis, Care After °Refer to this sheet in the next few weeks. These instructions provide you with information about caring for yourself after your procedure. Your health care provider may also give you more specific instructions. Your treatment has been planned according to current medical practices, but problems sometimes occur. Call your health care provider if you have any problems or questions after your procedure. °What can I expect after the procedure? °After your procedure, it is common to have a small amount of clear fluid coming from the puncture site. °Follow these instructions at home: °· Return to your normal activities as told by your health care provider. Ask your health care provider what activities are safe for you. °· Take over-the-counter and prescription medicines only as told by your health care provider. °· Do not take baths, swim, or use a hot tub until your health care provider approves. °· Follow instructions from your health care provider about: °? How to take care of your puncture site. °? When and how you should change your bandage (dressing). °? When you should remove your dressing. °· Check your puncture area every day signs of infection. Watch for: °? Redness, swelling, or pain. °? Fluid, blood, or pus. °· Keep all follow-up visits as told by your health care provider. This is important. °Contact a health care provider if: °· You have redness, swelling, or pain at your puncture site. °· You start to have more clear fluid coming from your puncture site. °· You have blood or pus coming from your puncture site. °· You have chills. °· You have a fever. °Get help right away if: °· You develop chest pain or shortness of breath. °· You develop increasing pain, discomfort, or swelling in your abdomen. °· You feel dizzy or light-headed or you pass out. °This information is not intended to replace advice given to you by your health care provider. Make sure you discuss any questions you  have with your health care provider. °Document Released: 06/11/2014 Document Revised: 07/03/2015 Document Reviewed: 04/09/2014 °Elsevier Interactive Patient Education © 2018 [employer] ° °

## 2016-10-29 ENCOUNTER — Ambulatory Visit
Admission: RE | Admit: 2016-10-29 | Discharge: 2016-10-29 | Disposition: A | Discharge status: Home / Self Care | Source: Ambulatory Visit | Attending: Unknown Physician Specialty | Admitting: Unknown Physician Specialty

## 2016-10-29 DIAGNOSIS — K7031 Alcoholic cirrhosis of liver with ascites: Secondary | ICD-10-CM | POA: Insufficient documentation

## 2016-10-29 DIAGNOSIS — R188 Other ascites: Secondary | ICD-10-CM | POA: Diagnosis present

## 2016-10-29 MED ORDER — ALBUMIN HUMAN 25 % IV SOLN
INTRAVENOUS | Status: AC
  Administered 2016-10-29: 25 g
  Filled 2016-10-29: qty 100

## 2016-10-29 NOTE — Procedures (Signed)
Ultrasound-guided  therapeutic paracentesis performed yielding 7.7 liters of yellow fluid. No immediate complications. The pt will receive IV albumin postprocedure.

## 2016-11-05 ENCOUNTER — Ambulatory Visit
Admission: RE | Admit: 2016-11-05 | Discharge: 2016-11-05 | Disposition: A | Discharge status: Home / Self Care | Source: Ambulatory Visit | Attending: Unknown Physician Specialty | Admitting: Unknown Physician Specialty

## 2016-11-05 ENCOUNTER — Encounter: Payer: Self-pay | Admitting: Emergency Medicine

## 2016-11-05 ENCOUNTER — Emergency Department: Payer: Medicare Other

## 2016-11-05 ENCOUNTER — Inpatient Hospital Stay
Admission: EM | Admit: 2016-11-05 | Discharge: 2016-11-10 | DRG: 872 | Disposition: A | Discharge status: Skilled Nursing Facility | Payer: Medicare Other | Attending: Internal Medicine | Admitting: Internal Medicine

## 2016-11-05 DIAGNOSIS — E7220 Disorder of urea cycle metabolism, unspecified: Secondary | ICD-10-CM

## 2016-11-05 DIAGNOSIS — R188 Other ascites: Secondary | ICD-10-CM

## 2016-11-05 DIAGNOSIS — K7031 Alcoholic cirrhosis of liver with ascites: Secondary | ICD-10-CM

## 2016-11-05 DIAGNOSIS — D6959 Other secondary thrombocytopenia: Secondary | ICD-10-CM | POA: Diagnosis present

## 2016-11-05 DIAGNOSIS — Z23 Encounter for immunization: Secondary | ICD-10-CM

## 2016-11-05 DIAGNOSIS — D684 Acquired coagulation factor deficiency: Secondary | ICD-10-CM | POA: Diagnosis present

## 2016-11-05 DIAGNOSIS — K7290 Hepatic failure, unspecified without coma: Secondary | ICD-10-CM | POA: Diagnosis not present

## 2016-11-05 DIAGNOSIS — K219 Gastro-esophageal reflux disease without esophagitis: Secondary | ICD-10-CM | POA: Diagnosis present

## 2016-11-05 DIAGNOSIS — Z808 Family history of malignant neoplasm of other organs or systems: Secondary | ICD-10-CM | POA: Diagnosis not present

## 2016-11-05 DIAGNOSIS — Z96652 Presence of left artificial knee joint: Secondary | ICD-10-CM | POA: Diagnosis present

## 2016-11-05 DIAGNOSIS — Z801 Family history of malignant neoplasm of trachea, bronchus and lung: Secondary | ICD-10-CM | POA: Diagnosis not present

## 2016-11-05 DIAGNOSIS — R0603 Acute respiratory distress: Secondary | ICD-10-CM | POA: Diagnosis present

## 2016-11-05 DIAGNOSIS — F329 Major depressive disorder, single episode, unspecified: Secondary | ICD-10-CM | POA: Diagnosis present

## 2016-11-05 DIAGNOSIS — R2681 Unsteadiness on feet: Secondary | ICD-10-CM | POA: Diagnosis not present

## 2016-11-05 DIAGNOSIS — M6281 Muscle weakness (generalized): Secondary | ICD-10-CM | POA: Diagnosis not present

## 2016-11-05 DIAGNOSIS — M109 Gout, unspecified: Secondary | ICD-10-CM | POA: Diagnosis present

## 2016-11-05 DIAGNOSIS — K7210 Chronic hepatic failure without coma: Secondary | ICD-10-CM | POA: Diagnosis present

## 2016-11-05 DIAGNOSIS — I10 Essential (primary) hypertension: Secondary | ICD-10-CM | POA: Diagnosis present

## 2016-11-05 DIAGNOSIS — Z96642 Presence of left artificial hip joint: Secondary | ICD-10-CM | POA: Diagnosis present

## 2016-11-05 DIAGNOSIS — E7800 Pure hypercholesterolemia, unspecified: Secondary | ICD-10-CM | POA: Diagnosis present

## 2016-11-05 DIAGNOSIS — R739 Hyperglycemia, unspecified: Secondary | ICD-10-CM | POA: Diagnosis not present

## 2016-11-05 DIAGNOSIS — R140 Abdominal distension (gaseous): Secondary | ICD-10-CM

## 2016-11-05 DIAGNOSIS — K652 Spontaneous bacterial peritonitis: Secondary | ICD-10-CM | POA: Diagnosis not present

## 2016-11-05 DIAGNOSIS — R7881 Bacteremia: Secondary | ICD-10-CM | POA: Diagnosis not present

## 2016-11-05 DIAGNOSIS — R4182 Altered mental status, unspecified: Secondary | ICD-10-CM | POA: Diagnosis not present

## 2016-11-05 DIAGNOSIS — R4020 Unspecified coma: Secondary | ICD-10-CM | POA: Diagnosis not present

## 2016-11-05 DIAGNOSIS — A4159 Other Gram-negative sepsis: Principal | ICD-10-CM | POA: Diagnosis present

## 2016-11-05 DIAGNOSIS — R42 Dizziness and giddiness: Secondary | ICD-10-CM | POA: Diagnosis present

## 2016-11-05 DIAGNOSIS — R918 Other nonspecific abnormal finding of lung field: Secondary | ICD-10-CM | POA: Diagnosis not present

## 2016-11-05 DIAGNOSIS — R6520 Severe sepsis without septic shock: Secondary | ICD-10-CM | POA: Diagnosis present

## 2016-11-05 DIAGNOSIS — R531 Weakness: Secondary | ICD-10-CM | POA: Diagnosis not present

## 2016-11-05 DIAGNOSIS — K7460 Unspecified cirrhosis of liver: Secondary | ICD-10-CM | POA: Diagnosis not present

## 2016-11-05 DIAGNOSIS — G47419 Narcolepsy without cataplexy: Secondary | ICD-10-CM | POA: Diagnosis present

## 2016-11-05 DIAGNOSIS — R609 Edema, unspecified: Secondary | ICD-10-CM

## 2016-11-05 DIAGNOSIS — G4733 Obstructive sleep apnea (adult) (pediatric): Secondary | ICD-10-CM | POA: Diagnosis present

## 2016-11-05 DIAGNOSIS — R0902 Hypoxemia: Secondary | ICD-10-CM | POA: Diagnosis not present

## 2016-11-05 DIAGNOSIS — A419 Sepsis, unspecified organism: Secondary | ICD-10-CM | POA: Diagnosis not present

## 2016-11-05 DIAGNOSIS — E785 Hyperlipidemia, unspecified: Secondary | ICD-10-CM | POA: Diagnosis present

## 2016-11-05 DIAGNOSIS — R296 Repeated falls: Secondary | ICD-10-CM | POA: Diagnosis not present

## 2016-11-05 DIAGNOSIS — R1314 Dysphagia, pharyngoesophageal phase: Secondary | ICD-10-CM | POA: Diagnosis not present

## 2016-11-05 DIAGNOSIS — Z7401 Bed confinement status: Secondary | ICD-10-CM | POA: Diagnosis not present

## 2016-11-05 DIAGNOSIS — R410 Disorientation, unspecified: Secondary | ICD-10-CM | POA: Diagnosis not present

## 2016-11-05 DIAGNOSIS — R0602 Shortness of breath: Secondary | ICD-10-CM | POA: Diagnosis not present

## 2016-11-05 LAB — BLOOD GAS, VENOUS
ACID-BASE EXCESS: 0.1 mmol/L (ref 0.0–2.0)
Bicarbonate: 26 mmol/L (ref 20.0–28.0)
O2 SAT: 64.4 %
PCO2 VEN: 46 mmHg (ref 44.0–60.0)
PO2 VEN: 35 mmHg (ref 32.0–45.0)
Patient temperature: 37
pH, Ven: 7.36 (ref 7.250–7.430)

## 2016-11-05 LAB — URINALYSIS, COMPLETE (UACMP) WITH MICROSCOPIC
Bacteria, UA: NONE SEEN
Bilirubin Urine: NEGATIVE
Crystals: NONE SEEN — AB
GLUCOSE, UA: NEGATIVE mg/dL
Hgb urine dipstick: NEGATIVE
Ketones, ur: NEGATIVE mg/dL
Leukocytes, UA: NEGATIVE
NITRITE: NEGATIVE
PROTEIN: 30 mg/dL — AB
Specific Gravity, Urine: 1.027 (ref 1.005–1.030)
pH: 5 (ref 5.0–8.0)

## 2016-11-05 LAB — COMPREHENSIVE METABOLIC PANEL
ALBUMIN: 3.5 g/dL (ref 3.5–5.0)
ALT: 26 U/L (ref 17–63)
AST: 75 U/L — AB (ref 15–41)
Alkaline Phosphatase: 95 U/L (ref 38–126)
Anion gap: 6 (ref 5–15)
BILIRUBIN TOTAL: 5.3 mg/dL — AB (ref 0.3–1.2)
BUN: 11 mg/dL (ref 6–20)
CHLORIDE: 99 mmol/L — AB (ref 101–111)
CO2: 28 mmol/L (ref 22–32)
Calcium: 8.7 mg/dL — ABNORMAL LOW (ref 8.9–10.3)
Creatinine, Ser: 0.98 mg/dL (ref 0.61–1.24)
GFR calc Af Amer: >60 mL/min (ref >60)
GFR calc non Af Amer: >60 mL/min (ref >60)
GLUCOSE: 117 mg/dL — AB (ref 65–99)
POTASSIUM: 4.2 mmol/L (ref 3.5–5.1)
Sodium: 133 mmol/L — ABNORMAL LOW (ref 135–145)
Total Protein: 7.1 g/dL (ref 6.5–8.1)

## 2016-11-05 LAB — TROPONIN I: Troponin I: <0.03 ng/mL (ref <0.03)

## 2016-11-05 LAB — CBC
HEMATOCRIT: 40.9 % (ref 40.0–52.0)
Hemoglobin: 14 g/dL (ref 13.0–18.0)
MCH: 34.4 pg — ABNORMAL HIGH (ref 26.0–34.0)
MCHC: 34.3 g/dL (ref 32.0–36.0)
MCV: 100.3 fL — ABNORMAL HIGH (ref 80.0–100.0)
PLATELETS: 84 10*3/uL — AB (ref 150–440)
RBC: 4.08 MIL/uL — AB (ref 4.40–5.90)
RDW: 16.8 % — AB (ref 11.5–14.5)
WBC: 8.1 10*3/uL (ref 3.8–10.6)

## 2016-11-05 LAB — LACTIC ACID, PLASMA
Lactic Acid, Venous: 3 mmol/L (ref 0.5–1.9)
Lactic Acid, Venous: 2 mmol/L (ref 0.5–1.9)

## 2016-11-05 LAB — AMMONIA: Ammonia: 58 umol/L — ABNORMAL HIGH (ref 9–35)

## 2016-11-05 LAB — GLUCOSE, CAPILLARY
GLUCOSE-CAPILLARY: 138 mg/dL — AB (ref 65–99)
Glucose-Capillary: 108 mg/dL — ABNORMAL HIGH (ref 65–99)

## 2016-11-05 MED ORDER — SPIRONOLACTONE 25 MG PO TABS
25.0000 mg | ORAL_TABLET | Freq: Every day | ORAL | Status: DC
  Administered 2016-11-06 – 2016-11-10 (×5): 25 mg via ORAL
  Filled 2016-11-05 (×5): qty 1

## 2016-11-05 MED ORDER — ONDANSETRON HCL 4 MG PO TABS: 4.0000 mg | ORAL_TABLET | Freq: Three times a day (TID) | ORAL | Status: DC | PRN

## 2016-11-05 MED ORDER — ONDANSETRON HCL 4 MG/2ML IJ SOLN
4.0000 mg | Freq: Once | INTRAMUSCULAR | Status: AC
  Administered 2016-11-05: 4 mg via INTRAVENOUS
  Filled 2016-11-05: qty 2

## 2016-11-05 MED ORDER — ALBUMIN HUMAN 25 % IV SOLN
25.0000 g | Freq: Once | INTRAVENOUS | Status: AC
  Administered 2016-11-05: 25 g via INTRAVENOUS
  Filled 2016-11-05: qty 100

## 2016-11-05 MED ORDER — ATORVASTATIN CALCIUM 10 MG PO TABS
10.0000 mg | ORAL_TABLET | Freq: Every day | ORAL | Status: DC
  Administered 2016-11-06: 10 mg via ORAL
  Filled 2016-11-05: qty 1

## 2016-11-05 MED ORDER — ONDANSETRON HCL 4 MG PO TABS: 4.0000 mg | ORAL_TABLET | Freq: Four times a day (QID) | ORAL | Status: DC | PRN

## 2016-11-05 MED ORDER — LAMOTRIGINE 25 MG PO TABS
25.0000 mg | ORAL_TABLET | Freq: Two times a day (BID) | ORAL | Status: DC
  Administered 2016-11-05 – 2016-11-10 (×10): 25 mg via ORAL
  Filled 2016-11-05 (×12): qty 1

## 2016-11-05 MED ORDER — SODIUM CHLORIDE 0.9 % IV SOLN
INTRAVENOUS | Status: DC
  Administered 2016-11-05: 20:00:00 via INTRAVENOUS

## 2016-11-05 MED ORDER — PANTOPRAZOLE SODIUM 40 MG PO TBEC
40.0000 mg | DELAYED_RELEASE_TABLET | Freq: Two times a day (BID) | ORAL | Status: DC
  Administered 2016-11-05 – 2016-11-10 (×10): 40 mg via ORAL
  Filled 2016-11-05 (×10): qty 1

## 2016-11-05 MED ORDER — ESCITALOPRAM OXALATE 10 MG PO TABS
20.0000 mg | ORAL_TABLET | Freq: Every day | ORAL | Status: DC
  Administered 2016-11-06: 20 mg via ORAL
  Filled 2016-11-05: qty 2
  Filled 2016-11-05: qty 1

## 2016-11-05 MED ORDER — LACTULOSE 10 GM/15ML PO SOLN
30.0000 g | Freq: Once | ORAL | Status: AC
  Administered 2016-11-05: 30 g via ORAL
  Filled 2016-11-05: qty 60

## 2016-11-05 MED ORDER — ALBUMIN HUMAN 25 % IV SOLN
INTRAVENOUS | Status: AC
  Administered 2016-11-05: 25 g via INTRAVENOUS
  Filled 2016-11-05: qty 100

## 2016-11-05 MED ORDER — ONDANSETRON HCL 4 MG/2ML IJ SOLN
4.0000 mg | Freq: Four times a day (QID) | INTRAMUSCULAR | Status: DC | PRN
  Filled 2016-11-05: qty 2

## 2016-11-05 MED ORDER — ACETAMINOPHEN 325 MG PO TABS
650.0000 mg | ORAL_TABLET | Freq: Four times a day (QID) | ORAL | Status: DC | PRN
  Administered 2016-11-05: 650 mg via ORAL
  Filled 2016-11-05: qty 2

## 2016-11-05 MED ORDER — LACTULOSE 10 GM/15ML PO SOLN
20.0000 g | Freq: Two times a day (BID) | ORAL | Status: DC
  Administered 2016-11-05 – 2016-11-10 (×9): 20 g via ORAL
  Filled 2016-11-05 (×10): qty 30

## 2016-11-05 MED ORDER — ACETAMINOPHEN 650 MG RE SUPP: 650.0000 mg | Freq: Four times a day (QID) | RECTAL | Status: DC | PRN

## 2016-11-05 MED ORDER — TRAMADOL HCL 50 MG PO TABS
50.0000 mg | ORAL_TABLET | Freq: Four times a day (QID) | ORAL | Status: DC | PRN
  Administered 2016-11-06 – 2016-11-07 (×5): 50 mg via ORAL
  Filled 2016-11-05 (×5): qty 1

## 2016-11-05 MED ORDER — SODIUM CHLORIDE 0.9 % IV BOLUS (SEPSIS)
1000.0000 mL | Freq: Once | INTRAVENOUS | Status: AC
  Administered 2016-11-05: 1000 mL via INTRAVENOUS

## 2016-11-05 MED ORDER — RIFAXIMIN 550 MG PO TABS
550.0000 mg | ORAL_TABLET | Freq: Two times a day (BID) | ORAL | Status: DC
  Administered 2016-11-05 – 2016-11-10 (×10): 550 mg via ORAL
  Filled 2016-11-05 (×10): qty 1

## 2016-11-05 MED ORDER — FUROSEMIDE 20 MG PO TABS
20.0000 mg | ORAL_TABLET | Freq: Every day | ORAL | Status: DC
  Administered 2016-11-06 – 2016-11-10 (×5): 20 mg via ORAL
  Filled 2016-11-05 (×5): qty 1

## 2016-11-05 MED ORDER — MAGNESIUM OXIDE 400 (241.3 MG) MG PO TABS
400.0000 mg | ORAL_TABLET | Freq: Two times a day (BID) | ORAL | Status: DC
  Administered 2016-11-05 – 2016-11-10 (×10): 400 mg via ORAL
  Filled 2016-11-05 (×10): qty 1

## 2016-11-05 NOTE — OR Nursing (Signed)
At 1405 after paracentesis completed. One half of 25G albumin infused. Transferring to specials to finish infusion. When sat patient up to get ready to leave ultrasound, pt became tremorous, and reported feeling weak. When checked with [place] staff he never had these symptoms post paracentesis. Assisted pt back to lying position. Vital signs checked, pt did not loose consciousness. Saturations varied between 88 and 92%. started on oygen 2 liters via [place]. Sbp 152. Dr Lowella Dandy called to bedside, fsbs 136 obtained. Transferred to Ed report given to [employer].

## 2016-11-05 NOTE — H&P (Addendum)
[facility] - [place] at [facility]   PATIENT NAME: Lawrence West    MR#:  [id number]  DATE OF BIRTH:  Jun 26, 1945  DATE OF ADMISSION:  11/05/2016  PRIMARY CARE PHYSICIAN: Kandyce Rud, MD   REQUESTING/REFERRING PHYSICIAN: Dr. Virgilio Frees  CHIEF COMPLAINT:   Chief Complaint  Patient presents with  . Weakness    HISTORY OF PRESENT ILLNESS:  Lawrence West  is a 71 y.o. male with a known history of Alcoholic liver cirrhosis, history of hepatic encephalopathy, osteoarthritis, avascular process of the left hip, obstructive sleep apnea, gout, hypertension, hyperlipidemia who presents to the hospital due to tremors, altered mental status. Patient apparently was getting his scheduled outpatient large volume paracentesis and shortly after the procedure he started to develop generalized body shakes and became somewhat confused. He was given some albumin but continued to be somewhat confused although he was not hypotensive. He was sent to the ER for further evaluation. In the emergency room patient continued to have some mild mental status change, but his ammonia level is close to baseline and his renal function is stable. He is not hypotensive. We attempted to check orthostatics vital signs but patient cannot stand as he is quite tremulous and weak and therefore hospitalist services were contacted further treatment and evaluation.  PAST MEDICAL HISTORY:   Past Medical History:  Diagnosis Date  . Arthritis    "left knee" (07/08/2015)  . Ascites   . Avascular necrosis of bone of left hip (HCC) 07/08/2015  . Brown recluse spider bite 1990s   "they thought I was gonna die then"  . Cirrhosis of liver (HCC)    Sees Dr. Sung Amabile  . Depression   . Edema of both legs    Takes Lasix  . Gout X 1  . High cholesterol   . Hypertension   . Narcolepsy   . Rheumatic fever   . Sleep apnea    pt stated "I have sleep apnea but I do not use the machine...Marland KitchenMarland KitchenMarland Kitchenit broke" (07/08/2015)     PAST SURGICAL HISTORY:   Past Surgical History:  Procedure Laterality Date  . COSMETIC SURGERY  ~ 1997   "nose done, lip drop, chin lift"  . ESOPHAGOGASTRODUODENOSCOPY N/A 12/27/2015   Procedure: ESOPHAGOGASTRODUODENOSCOPY (EGD);  Surgeon: Charlott Rakes, MD;  Location: [facility] ENDOSCOPY;  Service: Endoscopy;  Laterality: N/A;  . FOOT FRACTURE SURGERY Right   . FRACTURE SURGERY    . INGUINAL HERNIA REPAIR Bilateral 1990s  . TONSILLECTOMY  1954  . TOTAL HIP ARTHROPLASTY Left 07/08/2015  . TOTAL HIP ARTHROPLASTY Left 07/08/2015   Procedure: LEFT TOTAL HIP ARTHROPLASTY;  Surgeon: Teryl Lucy, MD;  Location: [facility] OR;  Service: Orthopedics;  Laterality: Left;  . TOTAL KNEE ARTHROPLASTY Left 03/2008  . US GUIDED PARACENTESIS [facility] H[facility])  April 2017    SOCIAL HISTORY:   Social History  Substance Use Topics  . Smoking status: Never Smoker  . Smokeless tobacco: Never Used  . Alcohol use 0.0 oz/week     Comment: 07/08/2015 former Heavy ETOH abuse.; "haven't had a drink in 2017; AA told me I didn't need to come back"    FAMILY HISTORY:   Family History  Problem Relation Age of Onset  . Melanoma Father   . Lung cancer Father     DRUG ALLERGIES:  No Known Allergies  REVIEW OF SYSTEMS:   Review of Systems  Constitutional: Negative for fever and weight loss.  HENT: Negative for congestion, nosebleeds and tinnitus.   Eyes:  Negative for blurred vision, double vision and redness.  Respiratory: Negative for cough, hemoptysis and shortness of breath.   Cardiovascular: Negative for chest pain, orthopnea, leg swelling and PND.  Gastrointestinal: Negative for abdominal pain, diarrhea, melena, nausea and vomiting.  Genitourinary: Negative for dysuria, hematuria and urgency.  Musculoskeletal: Negative for falls and joint pain.  Neurological: Positive for tremors and weakness. Negative for dizziness, tingling, sensory change, focal weakness, seizures and headaches.  Endo/Heme/Allergies:  Negative for polydipsia. Does not bruise/bleed easily.  Psychiatric/Behavioral: Negative for depression and memory loss. The patient is not nervous/anxious.     MEDICATIONS AT HOME:   Prior to Admission medications   Medication Sig Start Date End Date Taking? Authorizing Provider  atorvastatin (LIPITOR) 10 MG tablet Take 1 tablet (10 mg total) by mouth daily. 05/20/15  Yes Enid Baas, MD  escitalopram (LEXAPRO) 20 MG tablet Take 20 mg by mouth daily.   Yes [provider]  furosemide (LASIX) 20 MG tablet Take 20 mg by mouth daily. 06/08/16  Yes [provider]  lamoTRIgine (LAMICTAL) 25 MG tablet Take 25 mg by mouth 2 (two) times daily.  06/08/16  Yes [provider]  Magnesium 400 MG TABS Take 1 tablet by mouth 2 (two) times daily.   Yes [provider]  pantoprazole (PROTONIX) 40 MG tablet Take 1 tablet (40 mg total) by mouth 2 (two) times daily. 12/30/15  Yes Auburn Bilberry, MD  rifaximin (XIFAXAN) 550 MG TABS tablet Take 1 tablet (550 mg total) by mouth 2 (two) times daily. 06/19/16  Yes Sudini, Wardell Heath, MD  spironolactone (ALDACTONE) 25 MG tablet Take 1 tablet (25 mg total) by mouth daily. 12/30/15  Yes Auburn Bilberry, MD  traMADol (ULTRAM) 50 MG tablet Take 1 tablet (50 mg total) by mouth every 6 (six) hours as needed for severe pain. 12/31/15 12/30/16 Yes Phineas Semen, MD  bismuth subsalicylate (PEPTO BISMOL) 262 MG/15ML suspension Take 30 mLs by mouth every 6 (six) hours as needed.    [provider]  lactulose (CHRONULAC) 10 GM/15ML solution Take 30 mLs (20 g total) by mouth 2 (two) times daily. Patient not taking: Reported on 11/05/2016 06/25/16   Enedina Finner, MD  ondansetron (ZOFRAN) 4 MG tablet Take 1 tablet (4 mg total) by mouth every 8 (eight) hours as needed for nausea or vomiting. 07/08/15   Teryl Lucy, MD      VITAL SIGNS:  Blood pressure 122/79, pulse 99, temperature 99.2 F (37.3 C), temperature source Oral, resp. rate  15, height  (1.778 m), weight 90.7 kg (200 lb), SpO2 97 %.  PHYSICAL EXAMINATION:  Physical Exam  GENERAL:  71 y.o.-year-old patient lying in the bed in no acute distress.  EYES: Pupils equal, round, reactive to light and accommodation. No scleral icterus. Extraocular muscles intact.  HEENT: Head atraumatic, normocephalic. Oropharynx and nasopharynx clear. No oropharyngeal erythema, moist oral mucosa  NECK:  Supple, no jugular venous distention. No thyroid enlargement, no tenderness.  LUNGS: Normal breath sounds bilaterally, no wheezing, rales, rhonchi. No use of accessory muscles of respiration.  CARDIOVASCULAR: S1, S2 RRR. No murmurs, rubs, gallops, clicks.  ABDOMEN: Soft, nontender, slightly distended. Bowel sounds present. No organomegaly or mass.  EXTREMITIES: No pedal edema, cyanosis, or clubbing. + 2 pedal & radial pulses b/l.   NEUROLOGIC: Cranial nerves II through XII are intact. No focal Motor or sensory deficits appreciated b/l. Globally weak.  PSYCHIATRIC: The patient is alert and oriented x 3.  SKIN: No obvious rash, lesion, or ulcer.  LABORATORY PANEL:   CBC  Recent Labs Lab 11/05/16 1442  WBC 8.1  HGB 14.0  HCT 40.9  PLT 84*   ------------------------------------------------------------------------------------------------------------------  Chemistries   Recent Labs Lab 11/05/16 1442  NA 133*  K 4.2  CL 99*  CO2 28  GLUCOSE 117*  BUN 11  CREATININE 0.98  CALCIUM 8.7*  AST 75*  ALT 26  ALKPHOS 95  BILITOT 5.3*   ------------------------------------------------------------------------------------------------------------------  Cardiac Enzymes  Recent Labs Lab 11/05/16 1442  TROPONINI <0.03   ------------------------------------------------------------------------------------------------------------------  RADIOLOGY:  Dg Chest 1 View  Result Date: 11/05/2016 CLINICAL DATA:  Shortness of breath EXAM: CHEST 1 VIEW COMPARISON:   06/18/2016, CT chest 05/30/2008, 12/27/2015, 12/31/2015 FINDINGS: No acute consolidation or effusion. Stable cardiomediastinal silhouette ; retrocardiac opacity similar to 06/18/2016, possibly due to hiatal hernia or tortuous aorta. No pneumothorax. IMPRESSION: No acute infiltrate or edema. Electronically Signed   By: Jasmine Pang M.D.   On: 11/05/2016 15:09   Ct Head Wo Contrast  Result Date: 11/05/2016 CLINICAL DATA:  Altered level of consciousness. EXAM: CT HEAD WITHOUT CONTRAST TECHNIQUE: Contiguous axial images were obtained from the base of the skull through the vertex without intravenous contrast. COMPARISON:  CT head 06/06/2016 FINDINGS: Brain: Moderate atrophy. Negative for hydrocephalus. Negative for acute infarct, hemorrhage, or mass lesion. No fluid collection or shift of the midline structures. Vascular: Negative for hyperdense vessel Skull: Negative Sinuses/Orbits: Negative Other: None IMPRESSION: Generalized atrophy.  No acute abnormality. Electronically Signed   By: Marlan Palau M.D.   On: 11/05/2016 15:48   US Paracentesis  Result Date: 11/05/2016 INDICATION: History of alcoholic cirrhosis with recurrent abdominal ascites. Request is made for therapeutic paracentesis. EXAM: ULTRASOUND GUIDED THERAPEUTIC PARACENTESIS MEDICATIONS: 1% xylocaine COMPLICATIONS: None immediate. PROCEDURE: Informed written consent was obtained from the patient after a discussion of the risks, benefits and alternatives to treatment. A timeout was performed prior to the initiation of the procedure. Initial ultrasound scanning demonstrates a large amount of ascites within the right lower abdominal quadrant. The right lower abdomen was prepped and draped in the usual sterile fashion. 1% xylocaine was used for local anesthesia. Following this, a Safe-T-Centesis catheter was introduced. An ultrasound image was saved for documentation purposes. The paracentesis was performed. The catheter was removed and a dressing was  applied. When the procedure was over the nurse and ultrasound technologist noticed that the patient was shaking. They also felt like he had an altered mentation from prior to his procedure. His blood pressure was normal and his glucose was found to be 130. The patient did state that he had been having a lot of diarrhea from his lactulose. The patient was evaluated and the recommendation was made for him to be evaluated in the emergency department. There were no immediate complications from the procedure itself. FINDINGS: A total of approximately 7.3 L of serous fluid was removed. IMPRESSION: Successful ultrasound-guided paracentesis yielding 7.3 L liters of peritoneal fluid. Please see above for further details regarding postprocedure disposition. Read by: Barnetta Chapel, PA-C Electronically Signed   By: Richarda Overlie M.D.   On: 11/05/2016 14:51     IMPRESSION AND PLAN:   71 year old male with past medical history of alcoholic liver cirrhosis, hypertension, depression, GERD, obstructive sleep apnea, previous history of hepatic encephalopathy presents to the hospital due to tremors, altered mental status after paracentesis.  1. Altered mental status/weakness/tremor-etiology unclear. Suspected to be hepatic encephalopathy, with mild Orthostasis, although the patient's ammonia level is close to his baseline. He does not  have a flapping tremor consistent with hepatic encephalopathy. -This is probably related to his underlying deconditioning and chronic liver disease.Will gently hydrate the patient with IV fluids as he had large volume paracentesis today.  He did get Albumin post procedure.  -I will continue his lactulose, Xifaxan. Follow ammonia. I will get a physical therapy consult to assess his mobility. Patient CT head on admission was negative for acute pathology. There is noted to be acute infectious source.  2. History of hepatic encephalopathy-ammonia level close to baseline. Continue lactulose,  Xifaxan.  3. Depression-continue Lexapro.  4. Hyperlipidemia-continue atorvastatin.  5. GERD-continue Protonix.  6. Hx of Alcoholic Liver Cirrhosis - cont. Aldactone, Lasix, Lactulose, Xifaxan.   All the records are reviewed and case discussed with ED provider. Management plans discussed with the patient, family and they are in agreement.  CODE STATUS: Full code  TOTAL TIME TAKING CARE OF THIS PATIENT: 45 minutes.    Houston Siren M.D on 11/05/2016 at 4:53 PM  Between 7am to 6pm - Pager - [phone]  After 6pm go to [email or web address] - password [email or web address] [facility]  [facility]  Office  [phone]  CC: Primary care physician; Kandyce Rud, MD

## 2016-11-05 NOTE — Procedures (Signed)
Ultrasound-guided therapeutic paracentesis performed yielding 7.3 liters of serous colored fluid. After his procedure was complete, the patient starting shaking.  His glucose was 130.  He has been having a lot of diarrhea secondary to his lactulose.  The RN and tech felt like he was having AMS.  Dr. Lowella Dandy was alerted and evaluated him.  He recommended he go to the ED to have labs to rule out electrolyte imbalance etc.    Mersadie Kavanaugh E 2:47 PM 11/05/2016

## 2016-11-05 NOTE — ED Triage Notes (Addendum)
Pt came to ED from special procedures. Pt gets weekly parensenthesis. Today pulled 7 liters off pt, pt became weak and started shaking, sent over to ED. Pt had albumin running upon arrival. Pt placed on 02 in special procedures due to oxygen level 88% r/a. Pt not normally on oxygen. Pt alert and oriented but per pt friend, not acting his normal self.

## 2016-11-05 NOTE — Progress Notes (Signed)
Called by nurse that patient had a fever of 102.  Source unclear. CXR earlier (-), UA (-).   Tylenol given and will follow fever curve.  Hold off on abx presently.   Blood cultures were drawn in the ER.

## 2016-11-05 NOTE — Progress Notes (Signed)
Spoke with Dr. Cherlynn Kaiser. Pt spiked fever of 102.9. MD acknowledged holding off on antibiotics for now. Blood cultrues taken in Emergency Room.

## 2016-11-05 NOTE — ED Provider Notes (Signed)
[facility] Emergency Department Provider Note  ____________________________________________  Time seen: Approximately 3:21 PM  I have reviewed the triage vital signs and the nursing notes.   HISTORY  Chief Complaint Weakness    HPI Lawrence West is a 71 y.o. male with a history of cirrhosis requiring weekly paracentesis presenting with lightheadedness and altered mental status. The patient was at his regular paracentesis appointment, and after having 7 L removed, which is more than his usual amount, he began to have acute lightheadedness. He was noted to have an O2 sat of 88%, which resolved with 2 L nasal cannula. At baseline, he does use exogenous oxygenwhen necessary. He was given albumin, and then began to feel better but does not feel back to baseline. Both he and his best friend who accompanies him, report that he is experiencing altered mental status. Prior to paracentesis, the patient did not have any symptoms or illness. He denies any associated chest pain, shortness of breath, headache, nausea or vomiting, diarrhea or constipation, abdominal pain, cough or cold symptoms. He has been taking his lactulose as prescribed. No fevers or chills. He feels less edematous than usual.   Past Medical History:  Diagnosis Date  . Arthritis    "left knee" (07/08/2015)  . Ascites   . Avascular necrosis of bone of left hip (HCC) 07/08/2015  . Brown recluse spider bite 1990s   "they thought I was gonna die then"  . Cirrhosis of liver (HCC)    Sees Dr. Sung Amabile  . Depression   . Edema of both legs    Takes Lasix  . Gout X 1  . High cholesterol   . Hypertension   . Narcolepsy   . Rheumatic fever   . Sleep apnea    pt stated "I have sleep apnea but I do not use the machine...Marland KitchenMarland KitchenMarland Kitchenit broke" (07/08/2015)    Patient Active Problem List   Diagnosis Date Noted  . Confusion 06/18/2016  . Acute hepatic encephalopathy 06/18/2016  . Fall at home, initial encounter  06/18/2016  . Hepatic encephalopathy (HCC) 06/18/2016  . GIB (gastrointestinal bleeding) 12/27/2015  . Hyperlipidemia 07/23/2015  . Depression 07/23/2015  . Avascular necrosis of bone of left hip (HCC) 07/08/2015  . S/P total hip arthroplasty 07/08/2015  . Ascites due to alcoholic cirrhosis (HCC) 05/19/2015    Past Surgical History:  Procedure Laterality Date  . COSMETIC SURGERY  ~ 1997   "nose done, lip drop, chin lift"  . ESOPHAGOGASTRODUODENOSCOPY N/A 12/27/2015   Procedure: ESOPHAGOGASTRODUODENOSCOPY (EGD);  Surgeon: Charlott Rakes, MD;  Location: [facility] ENDOSCOPY;  Service: Endoscopy;  Laterality: N/A;  . FOOT FRACTURE SURGERY Right   . FRACTURE SURGERY    . INGUINAL HERNIA REPAIR Bilateral 1990s  . TONSILLECTOMY  1954  . TOTAL HIP ARTHROPLASTY Left 07/08/2015  . TOTAL HIP ARTHROPLASTY Left 07/08/2015   Procedure: LEFT TOTAL HIP ARTHROPLASTY;  Surgeon: Teryl Lucy, MD;  Location: [facility] OR;  Service: Orthopedics;  Laterality: Left;  . TOTAL KNEE ARTHROPLASTY Left 03/2008  . US GUIDED PARACENTESIS [facility] HX)  April 2017    Current Outpatient Rx  . Order #: [id number] Class: Normal  . Order #: [id number] Class: Historical Med  . Order #: [id number] Class: Historical Med  . Order #: [id number] Class: Normal  . Order #: [id number] Class: Historical Med  . Order #: [id number] Class: Normal  . Order #: [id number] Class: No Print  . Order #: [id number] Class: Normal  . Order #: [id number] Class: No Print  . Order #: [id number] Class: Print  Allergies Patient has no known allergies.  Family History  Problem Relation Age of Onset  . Melanoma Father   . Lung cancer Father     Social History Social History  Substance Use Topics  . Smoking status: Never Smoker  . Smokeless tobacco: Never Used  . Alcohol use 0.0 oz/week     Comment: 07/08/2015 former Heavy ETOH abuse.; "haven't had a drink in 2017; AA told me I didn't need to come back"    Review of Systems Constitutional: No  fever/chills.positive lightheadedness without syncope. No room spinning sensation. Positive general malaise and altered mental status. Eyes: No visual changes.No blurred or double vision. No eye discharge. ENT: No sore throat. No congestion or rhinorrhea.no ear pain. Cardiovascular: Denies chest pain. Denies palpitations. Respiratory: Denies shortness of breath.  No cough. Gastrointestinal: No abdominal pain.  No nausea, no vomiting.  No diarrhea.  No constipation.positive edema of the lower abdomen. Genitourinary: Negative for dysuria.No urinary frequency. Musculoskeletal: Negative for back pain. Skin: Negative for rash. Neurological: Negative for headaches. No focal numbness, tingling or weakness. Positive altered mental status. No changes in vision or speech.    ____________________________________________   PHYSICAL EXAM:  VITAL SIGNS: ED Triage Vitals [11/05/16 1443]  Enc Vitals Group     BP 117/81     Pulse Rate 97     Resp (!) 22     Temp 99.2 F (37.3 C)     Temp Source Oral     SpO2 94 %     Weight 200 lb (90.7 kg)     Height  (1.778 m)     Head Circumference      Peak Flow      Pain Score      Pain Loc      Pain Edu?      Excl. in GC?     Constitutional: the patient is alert and answers most questions appropriately. His GCS is 15. He is somewhat somnolent but responds normally. Eyes: Conjunctivae are normal.  EOMI. No scleral icterus. Head: Atraumatic. Nose: No congestion/rhinnorhea. Mouth/Throat: Mucous membranes are mildly dry.  Neck: No stridor.  Supple.  No JVD. Cardiovascular: Normal rate, regular rhythm. No murmurs, rubs or gallops.  Respiratory: mild tachypnea without accessory muscle use or retractions. Lungs CTAB.  No wheezes, rales or ronchi. Gastrointestinal: overweight.Soft, nontender and mildly distended. He patient has palpable edema throughout the abdomen, most notable in the lower abdomen. He has a pinpoint paracentesis site in the right  lower quadrant without any surrounding erythema, drainage, or tenderness; no fluctuance. No guarding or rebound.  No peritoneal signs. Musculoskeletal: bilateral symmetric LE edema. No ttp in the calves or palpable cords.  Negative Homan's sign. Neurologic:  The patient is alert and initially states it is August 2013, but then is able to state that it is September 2018.  Speech is slow but clear.  Face and smile are symmetric.  EOMI.  Moves all extremities well. Skin:  Skin is warm, dry and intact except for paracentesis site. No rash noted. Psychiatric: Mood and affect are normal.  ____________________________________________   LABS (all labs ordered are listed, but only abnormal results are displayed)  Labs Reviewed  CBC - Abnormal; Notable for the following:       Result Value   RBC 4.08 (*)    MCV 100.3 (*)    MCH 34.4 (*)    RDW 16.8 (*)    Platelets 84 (*)    All other components within  normal limits  COMPREHENSIVE METABOLIC PANEL - Abnormal; Notable for the following:    Sodium 133 (*)    Chloride 99 (*)    Glucose, Bld 117 (*)    Calcium 8.7 (*)    AST 75 (*)    Total Bilirubin 5.3 (*)    All other components within normal limits  LACTIC ACID, PLASMA - Abnormal; Notable for the following:    Lactic Acid, Venous 3.0 (*)    All other components within normal limits  AMMONIA - Abnormal; Notable for the following:    Ammonia 58 (*)    All other components within normal limits  GLUCOSE, CAPILLARY - Abnormal; Notable for the following:    Glucose-Capillary 108 (*)    All other components within normal limits  CULTURE, BLOOD (ROUTINE X 2)  CULTURE, BLOOD (ROUTINE X 2)  TROPONIN I  BLOOD GAS, VENOUS  URINALYSIS, COMPLETE (UACMP) WITH MICROSCOPIC  LACTIC ACID, PLASMA  CBG MONITORING, ED   ____________________________________________  EKG  ED ECG REPORT I, Rockne Menghini, the attending physician, personally viewed and interpreted this ECG.   Date: 11/05/2016   EKG Time: 1440  Rate: 98  Rhythm: normal sinus rhythm  Axis: leftward  Intervals:first-degree A-V block   ST&T Change: No STEMI  ____________________________________________  RADIOLOGY  Dg Chest 1 View  Result Date: 11/05/2016 CLINICAL DATA:  Shortness of breath EXAM: CHEST 1 VIEW COMPARISON:  06/18/2016, CT chest 05/30/2008, 12/27/2015, 12/31/2015 FINDINGS: No acute consolidation or effusion. Stable cardiomediastinal silhouette ; retrocardiac opacity similar to 06/18/2016, possibly due to hiatal hernia or tortuous aorta. No pneumothorax. IMPRESSION: No acute infiltrate or edema. Electronically Signed   By: Jasmine Pang M.D.   On: 11/05/2016 15:09   Ct Head Wo Contrast  Result Date: 11/05/2016 CLINICAL DATA:  Altered level of consciousness. EXAM: CT HEAD WITHOUT CONTRAST TECHNIQUE: Contiguous axial images were obtained from the base of the skull through the vertex without intravenous contrast. COMPARISON:  CT head 06/06/2016 FINDINGS: Brain: Moderate atrophy. Negative for hydrocephalus. Negative for acute infarct, hemorrhage, or mass lesion. No fluid collection or shift of the midline structures. Vascular: Negative for hyperdense vessel Skull: Negative Sinuses/Orbits: Negative Other: None IMPRESSION: Generalized atrophy.  No acute abnormality. Electronically Signed   By: Marlan Palau M.D.   On: 11/05/2016 15:48   US Paracentesis  Result Date: 11/05/2016 INDICATION: History of alcoholic cirrhosis with recurrent abdominal ascites. Request is made for therapeutic paracentesis. EXAM: ULTRASOUND GUIDED THERAPEUTIC PARACENTESIS MEDICATIONS: 1% xylocaine COMPLICATIONS: None immediate. PROCEDURE: Informed written consent was obtained from the patient after a discussion of the risks, benefits and alternatives to treatment. A timeout was performed prior to the initiation of the procedure. Initial ultrasound scanning demonstrates a large amount of ascites within the right lower abdominal quadrant. The  right lower abdomen was prepped and draped in the usual sterile fashion. 1% xylocaine was used for local anesthesia. Following this, a Safe-T-Centesis catheter was introduced. An ultrasound image was saved for documentation purposes. The paracentesis was performed. The catheter was removed and a dressing was applied. When the procedure was over the nurse and ultrasound technologist noticed that the patient was shaking. They also felt like he had an altered mentation from prior to his procedure. His blood pressure was normal and his glucose was found to be 130. The patient did state that he had been having a lot of diarrhea from his lactulose. The patient was evaluated and the recommendation was made for him to be evaluated in the emergency department.  There were no immediate complications from the procedure itself. FINDINGS: A total of approximately 7.3 L of serous fluid was removed. IMPRESSION: Successful ultrasound-guided paracentesis yielding 7.3 L liters of peritoneal fluid. Please see above for further details regarding postprocedure disposition. Read by: Barnetta Chapel, PA-C Electronically Signed   By: Richarda Overlie M.D.   On: 11/05/2016 14:51    ____________________________________________   PROCEDURES  Procedure(s) performed: None  Procedures  Critical Care performed: No ____________________________________________   INITIAL IMPRESSION / ASSESSMENT AND PLAN / ED COURSE  Pertinent labs & imaging results that were available during my care of the patient were reviewed by me and considered in my medical decision making (see chart for details).  71 y.o. Male with a history of cirrhosis and weekly paracentesis presenting with an episode of lightheadedness after 7 L removal from paracentesis, but continued altered mental status. There are multiple possible etiologies for the patient's symptoms. On arrival to the emergency department he is afebrile and has stable vital signs. Hyperammonemia as at  the top of the differential.I would consider intravascular depletion after large paracentesis. We will also check for hypercarbia or hypoxia given his O2 sats at the paracentesis. He has not recently had any infectious symptoms, but he has a high risk, so would consider UTI, pneumonia, bacteremia, or SBP. On my exam however, the patient has a soft nontender abdomen, is not febrile and has a normal white blood cell count so infection is much less likely. We will also get a CT of the head, rule out ACS or MI. Plan admission.  ----------------------------------------- 3:53 PM on 11/05/2016 -----------------------------------------  When compared with his prior CBC, the patient's CBC today does appear to be hemoconcentrated as all of his levels are elevated across the board. He does have hyperammonemia, which is not too far from his baseline, but we will treat him with lactulose. His CT scan does not show any acute intracranial process. At this time, the patient has not regained baseline mental status a plan to admit him to the hospital for further evaluation and treatment. ____________________________________________  FINAL CLINICAL IMPRESSION(S) / ED DIAGNOSES  Final diagnoses:  Hyperammonemia (HCC)  Altered mental status, unspecified altered mental status type  Lightheadedness         NEW MEDICATIONS STARTED DURING THIS VISIT:  New Prescriptions   No medications on file      Rockne Menghini, MD 11/05/16 1554

## 2016-11-05 NOTE — ED Notes (Signed)
Patient unable to stand under his own power for Orthostatics. MD notified

## 2016-11-06 ENCOUNTER — Inpatient Hospital Stay: Payer: Medicare Other

## 2016-11-06 ENCOUNTER — Observation Stay: Payer: Medicare Other

## 2016-11-06 DIAGNOSIS — Z96652 Presence of left artificial knee joint: Secondary | ICD-10-CM | POA: Diagnosis present

## 2016-11-06 DIAGNOSIS — A4159 Other Gram-negative sepsis: Secondary | ICD-10-CM | POA: Diagnosis present

## 2016-11-06 DIAGNOSIS — R6520 Severe sepsis without septic shock: Secondary | ICD-10-CM | POA: Diagnosis present

## 2016-11-06 DIAGNOSIS — Z23 Encounter for immunization: Secondary | ICD-10-CM | POA: Diagnosis present

## 2016-11-06 DIAGNOSIS — R7881 Bacteremia: Secondary | ICD-10-CM | POA: Diagnosis not present

## 2016-11-06 DIAGNOSIS — R918 Other nonspecific abnormal finding of lung field: Secondary | ICD-10-CM | POA: Diagnosis not present

## 2016-11-06 DIAGNOSIS — G4733 Obstructive sleep apnea (adult) (pediatric): Secondary | ICD-10-CM | POA: Diagnosis present

## 2016-11-06 DIAGNOSIS — K7290 Hepatic failure, unspecified without coma: Secondary | ICD-10-CM | POA: Diagnosis not present

## 2016-11-06 DIAGNOSIS — R0603 Acute respiratory distress: Secondary | ICD-10-CM | POA: Diagnosis not present

## 2016-11-06 DIAGNOSIS — K7031 Alcoholic cirrhosis of liver with ascites: Secondary | ICD-10-CM | POA: Diagnosis present

## 2016-11-06 DIAGNOSIS — A419 Sepsis, unspecified organism: Secondary | ICD-10-CM

## 2016-11-06 DIAGNOSIS — G47419 Narcolepsy without cataplexy: Secondary | ICD-10-CM | POA: Diagnosis present

## 2016-11-06 DIAGNOSIS — Z96642 Presence of left artificial hip joint: Secondary | ICD-10-CM | POA: Diagnosis present

## 2016-11-06 DIAGNOSIS — D684 Acquired coagulation factor deficiency: Secondary | ICD-10-CM | POA: Diagnosis present

## 2016-11-06 DIAGNOSIS — R410 Disorientation, unspecified: Secondary | ICD-10-CM

## 2016-11-06 DIAGNOSIS — F329 Major depressive disorder, single episode, unspecified: Secondary | ICD-10-CM | POA: Diagnosis present

## 2016-11-06 DIAGNOSIS — K7210 Chronic hepatic failure without coma: Secondary | ICD-10-CM | POA: Diagnosis present

## 2016-11-06 DIAGNOSIS — M109 Gout, unspecified: Secondary | ICD-10-CM | POA: Diagnosis present

## 2016-11-06 DIAGNOSIS — E785 Hyperlipidemia, unspecified: Secondary | ICD-10-CM | POA: Diagnosis present

## 2016-11-06 DIAGNOSIS — E7800 Pure hypercholesterolemia, unspecified: Secondary | ICD-10-CM | POA: Diagnosis present

## 2016-11-06 DIAGNOSIS — R0902 Hypoxemia: Secondary | ICD-10-CM | POA: Diagnosis not present

## 2016-11-06 DIAGNOSIS — Z808 Family history of malignant neoplasm of other organs or systems: Secondary | ICD-10-CM | POA: Diagnosis not present

## 2016-11-06 DIAGNOSIS — R739 Hyperglycemia, unspecified: Secondary | ICD-10-CM | POA: Diagnosis not present

## 2016-11-06 DIAGNOSIS — R42 Dizziness and giddiness: Secondary | ICD-10-CM | POA: Diagnosis present

## 2016-11-06 DIAGNOSIS — D6959 Other secondary thrombocytopenia: Secondary | ICD-10-CM | POA: Diagnosis present

## 2016-11-06 DIAGNOSIS — I10 Essential (primary) hypertension: Secondary | ICD-10-CM | POA: Diagnosis present

## 2016-11-06 DIAGNOSIS — K219 Gastro-esophageal reflux disease without esophagitis: Secondary | ICD-10-CM | POA: Diagnosis present

## 2016-11-06 DIAGNOSIS — Z801 Family history of malignant neoplasm of trachea, bronchus and lung: Secondary | ICD-10-CM | POA: Diagnosis not present

## 2016-11-06 LAB — BLOOD CULTURE ID PANEL (REFLEXED)
ACINETOBACTER BAUMANNII: NOT DETECTED
CANDIDA ALBICANS: NOT DETECTED
CANDIDA GLABRATA: NOT DETECTED
CANDIDA PARAPSILOSIS: NOT DETECTED
CANDIDA TROPICALIS: NOT DETECTED
Candida krusei: NOT DETECTED
Carbapenem resistance: NOT DETECTED
ENTEROBACTER CLOACAE COMPLEX: NOT DETECTED
Enterobacteriaceae species: DETECTED — AB
Enterococcus species: NOT DETECTED
Escherichia coli: NOT DETECTED
HAEMOPHILUS INFLUENZAE: NOT DETECTED
KLEBSIELLA PNEUMONIAE: DETECTED — AB
Klebsiella oxytoca: NOT DETECTED
Listeria monocytogenes: NOT DETECTED
Neisseria meningitidis: NOT DETECTED
PROTEUS SPECIES: NOT DETECTED
Pseudomonas aeruginosa: NOT DETECTED
SERRATIA MARCESCENS: NOT DETECTED
STAPHYLOCOCCUS SPECIES: NOT DETECTED
STREPTOCOCCUS AGALACTIAE: NOT DETECTED
STREPTOCOCCUS PNEUMONIAE: NOT DETECTED
STREPTOCOCCUS PYOGENES: NOT DETECTED
STREPTOCOCCUS SPECIES: NOT DETECTED
Staphylococcus aureus (BCID): NOT DETECTED

## 2016-11-06 LAB — BASIC METABOLIC PANEL
Anion gap: 8 (ref 5–15)
BUN: 14 mg/dL (ref 6–20)
CHLORIDE: 103 mmol/L (ref 101–111)
CO2: 24 mmol/L (ref 22–32)
CREATININE: 1.03 mg/dL (ref 0.61–1.24)
Calcium: 8.2 mg/dL — ABNORMAL LOW (ref 8.9–10.3)
GFR calc Af Amer: >60 mL/min (ref >60)
GFR calc non Af Amer: >60 mL/min (ref >60)
GLUCOSE: 141 mg/dL — AB (ref 65–99)
POTASSIUM: 4 mmol/L (ref 3.5–5.1)
SODIUM: 135 mmol/L (ref 135–145)

## 2016-11-06 LAB — AMMONIA: Ammonia: 39 umol/L — ABNORMAL HIGH (ref 9–35)

## 2016-11-06 LAB — ALBUMIN, PLEURAL OR PERITONEAL FLUID: Albumin, Fluid: <1 g/dL

## 2016-11-06 LAB — CBC
HEMATOCRIT: 37.4 % — AB (ref 40.0–52.0)
Hemoglobin: 12.9 g/dL — ABNORMAL LOW (ref 13.0–18.0)
MCH: 34.7 pg — AB (ref 26.0–34.0)
MCHC: 34.6 g/dL (ref 32.0–36.0)
MCV: 100.3 fL — AB (ref 80.0–100.0)
Platelets: 66 10*3/uL — ABNORMAL LOW (ref 150–440)
RBC: 3.73 MIL/uL — ABNORMAL LOW (ref 4.40–5.90)
RDW: 16.5 % — AB (ref 11.5–14.5)
WBC: 12.1 10*3/uL — AB (ref 3.8–10.6)

## 2016-11-06 LAB — BODY FLUID CELL COUNT WITH DIFFERENTIAL
EOS FL: 0 %
LYMPHS FL: 18 %
MONOCYTE-MACROPHAGE-SEROUS FLUID: 60 %
Neutrophil Count, Fluid: 22 %
Other Cells, Fluid: 0 %
WBC FLUID: 93 uL

## 2016-11-06 MED ORDER — ALBUMIN HUMAN 25 % IV SOLN
25.0000 g | INTRAVENOUS | Status: AC
  Administered 2016-11-06: 25 g via INTRAVENOUS
  Filled 2016-11-06: qty 100

## 2016-11-06 MED ORDER — LORAZEPAM 2 MG/ML IJ SOLN: 0.5000 mg | Freq: Once | INTRAMUSCULAR | Status: DC

## 2016-11-06 MED ORDER — IPRATROPIUM-ALBUTEROL 0.5-2.5 (3) MG/3ML IN SOLN: 3.0000 mL | RESPIRATORY_TRACT | Status: DC

## 2016-11-06 MED ORDER — LORAZEPAM 2 MG/ML IJ SOLN: 0.5000 mg | INTRAMUSCULAR | Status: DC | PRN

## 2016-11-06 MED ORDER — DEXMEDETOMIDINE HCL IN NACL 400 MCG/100ML IV SOLN
0.0000 ug/kg/h | INTRAVENOUS | Status: DC
  Administered 2016-11-06: 0.4 ug/kg/h via INTRAVENOUS
  Filled 2016-11-06: qty 100

## 2016-11-06 MED ORDER — METHYLPREDNISOLONE SODIUM SUCC 125 MG IJ SOLR: 60.0000 mg | INTRAMUSCULAR | Status: DC

## 2016-11-06 MED ORDER — ONDANSETRON HCL 4 MG/2ML IJ SOLN: 4.0000 mg | Freq: Four times a day (QID) | INTRAMUSCULAR | Status: DC | PRN

## 2016-11-06 MED ORDER — LORAZEPAM 2 MG/ML IJ SOLN: 0.5000 mg | INTRAMUSCULAR | Status: DC

## 2016-11-06 MED ORDER — LORAZEPAM 2 MG/ML IJ SOLN
INTRAMUSCULAR | Status: AC
  Filled 2016-11-06: qty 1

## 2016-11-06 MED ORDER — DEXTROSE 5 % IV SOLN
2.0000 g | Freq: Three times a day (TID) | INTRAVENOUS | Status: DC
  Administered 2016-11-06: 2 g via INTRAVENOUS
  Filled 2016-11-06 (×3): qty 2

## 2016-11-06 MED ORDER — SODIUM CHLORIDE 0.9 % IV SOLN
1.0000 g | Freq: Three times a day (TID) | INTRAVENOUS | Status: DC
  Administered 2016-11-06 – 2016-11-08 (×6): 1 g via INTRAVENOUS
  Filled 2016-11-06 (×10): qty 1

## 2016-11-06 MED ORDER — IPRATROPIUM-ALBUTEROL 0.5-2.5 (3) MG/3ML IN SOLN
3.0000 mL | Freq: Four times a day (QID) | RESPIRATORY_TRACT | Status: DC
  Administered 2016-11-07: 3 mL via RESPIRATORY_TRACT
  Filled 2016-11-06: qty 3

## 2016-11-06 MED ORDER — SODIUM CHLORIDE 0.9 % IV SOLN
1.0000 g | Freq: Three times a day (TID) | INTRAVENOUS | Status: DC
  Filled 2016-11-06 (×2): qty 1

## 2016-11-06 NOTE — Progress Notes (Signed)
Received RR page for pt in [id number]. Novel[place] met pt and medical team attending to pt. Pt alert and responding to commands was being stabilized. Pt transferred to [place]. [place] offered silent prayer and pastoral presence.   11/06/16 1200  Clinical Encounter Type  Visited With Patient;Health care provider  Visit Type Initial;Code;Other (Comment)  Referral From Nurse  Consult/Referral To Chaplain  Spiritual Encounters  Spiritual Needs Prayer;Emotional;Other (Comment)

## 2016-11-06 NOTE — Procedures (Signed)
Ultrasound-guided diagnostic and therapeutic paracentesis performed yielding 2 liters of serous colored fluid. No immediate complications. Limit was 2L  Deiondre Harrower E 2:56 PM 11/06/2016

## 2016-11-06 NOTE — Progress Notes (Signed)
[facility] [place] [facility]   PATIENT NAME: Mizraim Harmening    MR#:  [id number]  DATE OF BIRTH:  October 31, 1945  SUBJECTIVE:  CHIEF COMPLAINT:   Chief Complaint  Patient presents with  . Weakness     Liver cirrhosis and recurrent ascites and paracentesis every week. Had done yesterday with 7.3 ltr fluid removed. Now came with chills , shaking.  Noted to be septic and have bacteremia.   Seen twice- Around 10 am, was stable- no distress.   Soon around 11 am- rapid response called in after doing PT, about severe respi disress, tachypnea and hypoxia.  REVIEW OF SYSTEMS:  CONSTITUTIONAL: No fever, fatigue or weakness.  EYES: No blurred or double vision.  EARS, NOSE, AND THROAT: No tinnitus or ear pain.  RESPIRATORY: No cough, shortness of breath, wheezing or hemoptysis.  CARDIOVASCULAR: No chest pain, orthopnea, edema.  GASTROINTESTINAL: No nausea, vomiting, diarrhea, some abdominal pain.  GENITOURINARY: No dysuria, hematuria.  ENDOCRINE: No polyuria, nocturia,  HEMATOLOGY: No anemia, easy bruising or bleeding SKIN: No rash or lesion. MUSCULOSKELETAL: No joint pain or arthritis.   NEUROLOGIC: No tingling, numbness, weakness.  PSYCHIATRY: No anxiety or depression.   ROS  DRUG ALLERGIES:  No Known Allergies  VITALS:  Blood pressure 121/79, pulse (!) 116, temperature (!) 103.2 F (39.6 C), temperature source Axillary, resp. rate 15, height  (1.778 m), weight 90.7 kg (199 lb 15.3 oz), SpO2 93 %.  PHYSICAL EXAMINATION:  GENERAL:  71 y.o.-year-old patient lying in the bed with no acute distress earlier, but severe distress during rapid response.  EYES: Pupils equal, round, reactive to light and accommodation. No scleral icterus. Extraocular muscles intact.  HEENT: Head atraumatic, normocephalic. Oropharynx and nasopharynx clear.  NECK:  Supple, no jugular venous distention. No thyroid enlargement, no tenderness.  LUNGS: Normal breath sounds bilaterally, no  wheezing, some crepitation. No use of accessory muscles of respiration earlier, but later had tachypnea, with use of accessory muscle and on NRBM.Marland Kitchen  CARDIOVASCULAR: S1, S2 normal. No murmurs, rubs, or gallops.  ABDOMEN: Soft, nontender, distended. Bowel sounds present. No organomegaly or mass.  EXTREMITIES: No pedal edema, cyanosis, or clubbing.  NEUROLOGIC: Cranial nerves II through XII are intact. Muscle strength 3-5/5 in all extremities. Sensation intact. Gait not checked. Mild shaking. PSYCHIATRIC: The patient is alert and oriented x 3. Appeared agitated second time on NRBM> SKIN: No obvious rash, lesion, or ulcer.   Physical Exam LABORATORY PANEL:   CBC  Recent Labs Lab 11/06/16 0355  WBC 12.1*  HGB 12.9*  HCT 37.4*  PLT 66*   ------------------------------------------------------------------------------------------------------------------  Chemistries   Recent Labs Lab 11/05/16 1442 11/06/16 0355  NA 133* 135  K 4.2 4.0  CL 99* 103  CO2 28 24  GLUCOSE 117* 141*  BUN 11 14  CREATININE 0.98 1.03  CALCIUM 8.7* 8.2*  AST 75*  --   ALT 26  --   ALKPHOS 95  --   BILITOT 5.3*  --    ------------------------------------------------------------------------------------------------------------------  Cardiac Enzymes  Recent Labs Lab 11/05/16 1442  TROPONINI <0.03   ------------------------------------------------------------------------------------------------------------------  RADIOLOGY:  Dg Chest 1 View  Result Date: 11/05/2016 CLINICAL DATA:  Shortness of breath EXAM: CHEST 1 VIEW COMPARISON:  06/18/2016, CT chest 05/30/2008, 12/27/2015, 12/31/2015 FINDINGS: No acute consolidation or effusion. Stable cardiomediastinal silhouette ; retrocardiac opacity similar to 06/18/2016, possibly due to hiatal hernia or tortuous aorta. No pneumothorax. IMPRESSION: No acute infiltrate or edema. Electronically Signed   By: Jasmine Pang  M.D.   On: 11/05/2016 15:09   Ct Head  Wo Contrast  Result Date: 11/05/2016 CLINICAL DATA:  Altered level of consciousness. EXAM: CT HEAD WITHOUT CONTRAST TECHNIQUE: Contiguous axial images were obtained from the base of the skull through the vertex without intravenous contrast. COMPARISON:  CT head 06/06/2016 FINDINGS: Brain: Moderate atrophy. Negative for hydrocephalus. Negative for acute infarct, hemorrhage, or mass lesion. No fluid collection or shift of the midline structures. Vascular: Negative for hyperdense vessel Skull: Negative Sinuses/Orbits: Negative Other: None IMPRESSION: Generalized atrophy.  No acute abnormality. Electronically Signed   By: Marlan Palau M.D.   On: 11/05/2016 15:48   US Paracentesis  Result Date: 11/05/2016 INDICATION: History of alcoholic cirrhosis with recurrent abdominal ascites. Request is made for therapeutic paracentesis. EXAM: ULTRASOUND GUIDED THERAPEUTIC PARACENTESIS MEDICATIONS: 1% xylocaine COMPLICATIONS: None immediate. PROCEDURE: Informed written consent was obtained from the patient after a discussion of the risks, benefits and alternatives to treatment. A timeout was performed prior to the initiation of the procedure. Initial ultrasound scanning demonstrates a large amount of ascites within the right lower abdominal quadrant. The right lower abdomen was prepped and draped in the usual sterile fashion. 1% xylocaine was used for local anesthesia. Following this, a Safe-T-Centesis catheter was introduced. An ultrasound image was saved for documentation purposes. The paracentesis was performed. The catheter was removed and a dressing was applied. When the procedure was over the nurse and ultrasound technologist noticed that the patient was shaking. They also felt like he had an altered mentation from prior to his procedure. His blood pressure was normal and his glucose was found to be 130. The patient did state that he had been having a lot of diarrhea from his lactulose. The patient was evaluated and  the recommendation was made for him to be evaluated in the emergency department. There were no immediate complications from the procedure itself. FINDINGS: A total of approximately 7.3 L of serous fluid was removed. IMPRESSION: Successful ultrasound-guided paracentesis yielding 7.3 L liters of peritoneal fluid. Please see above for further details regarding postprocedure disposition. Read by: Barnetta Chapel, PA-C Electronically Signed   By: Richarda Overlie M.D.   On: 11/05/2016 14:51    ASSESSMENT AND PLAN:   Active Problems:   Altered mental status  71 year old male with past medical history of alcoholic liver cirrhosis, hypertension, depression, GERD, obstructive sleep apnea, previous history of hepatic encephalopathy presents to the hospital due to tremors, altered mental status after paracentesis.  1. Sepsis, due to SBP, bacteremia   Ac hypoxic respi failure.  Altered mental status/weakness/tremor-  UA and Xray chest negative.  Initially did not receive Abx, but later After fever and sepsis- Started cefepime and changed to meropenem.  Spoke to Intensivist and IR oncall, may need to get ascites tap.  2. History of hepatic encephalopathy-ammonia level close to baseline. Continue lactulose, Xifaxan.  3. Depression-continue Lexapro.  4. Hyperlipidemia-continue atorvastatin.  5. GERD-continue Protonix.  6. Hx of Alcoholic Liver Cirrhosis - cont. Aldactone, Lasix, Lactulose, Xifaxan.     All the records are reviewed and case discussed with Care Management/Social Workerr. Management plans discussed with the patient, family and they are in agreement.  CODE STATUS: Full.  TOTAL TIME TAKING CARE OF THIS PATIENT: 45 critical care minutes.    POSSIBLE D/C IN 2-3 DAYS, DEPENDING ON CLINICAL CONDITION.   Altamese Dilling M.D on 11/06/2016   Between 7am to 6pm - Pager - [phone]  After 6pm go to [email or web address] - password [email or web address]  [facility]  Office   [phone]  CC: Primary care physician; Kandyce Rud, MD  Note: This dictation was prepared with Dragon dictation along with smaller phrase technology. Any transcriptional errors that result from this process are unintentional.

## 2016-11-06 NOTE — Clinical Social Work Note (Signed)
CSW received consult for possible placement. CSW will follow pending PT recommendation.   Ilyas Lipsitz Martha Zeus Marquis, MSW, LCSWA [phone] 

## 2016-11-06 NOTE — Progress Notes (Signed)
Physical Therapy Evaluation Patient Details Name: Lawrence West MRN: [id number] DOB: 08-12-1945 Today's Date: 11/06/2016   History of Present Illness  Patient is a 71 y.o. male admitted on 28 SEP with worsening shaking/confusion/weakness after having scheduled large volume paracentesis. PMH includes alcoholism, liver cirrhosis, hepatic encephalitis, OA, avascular necrosis of L hip, obstructive sleep apnea, gout, HTN, and HLD.  Clinical Impression  Patient admitted for above listed reasons. At time of evaluation, patient alert and oriented, reporting household independence with [facility]. Patient was complaining of back/stomach pain at time of evaluation. When PT uncovered patient in bed, patient began shaking, complaining of feeling cold. PT assessed sensation/strength WNL. Negative clonus and UE/LE RAM, including finger to nose and heel to shin. When moving from supine to sit, patient complained of being cold and increased shaking. Deferred sit to stand and quickly returned to supine. Assessed HR at 105 bpm and O2 at 87%. Replaced 2L O2 and informed RN who further assessed temperature/vitals. PT concluded evaluation, and RNs determined to call rapid response. Patient transferred to ICU.    Follow Up Recommendations SNF    Equipment Recommendations  None recommended by PT    Recommendations for Other Services       Precautions / Restrictions Precautions Precautions: Fall Restrictions Weight Bearing Restrictions: No      Mobility  Bed Mobility Overal bed mobility: Needs Assistance Bed Mobility: Supine to Sit;Sit to Supine     Supine to sit: Min assist;HOB elevated Sit to supine: Min guard   General bed mobility comments: Upon removal of bedding, patient began shaking. Able to move from supine to sit with minimal assistance with HOB elevated and use of bed rails. Stated he felt dizzy but had no LOB sitting at EOB. Returned quickly to supine with CGA. Replaced bedding. HR 105 bpm with O2  87%. Placed on 2L O2 and notified RN.  Transfers                 General transfer comment: Not performed  Ambulation/Gait             General Gait Details: Not performed  Stairs            Wheelchair Mobility    Modified Rankin (Stroke Patients Only)       Balance Overall balance assessment: Needs assistance Sitting-balance support: Feet supported Sitting balance-Leahy Scale: Fair                                       Pertinent Vitals/Pain Pain Assessment: Faces Faces Pain Scale: Hurts a little bit Pain Location: Back/stomach Pain Descriptors / Indicators: Aching Pain Intervention(s): Limited activity within patient's tolerance;Monitored during session;Other (comment) (RN notified)    Home Living Family/patient expects to be discharged to:: Private residence Living Arrangements: Alone Available Help at Discharge: Friend(s);Available PRN/intermittently Type of Home: House Home Access: Ramped entrance     Home Layout: Multi-level;Able to live on main level with bedroom/bathroom Home Equipment: Dan Humphreys - 2 wheels;Walker - 4 wheels;Grab bars - tub/shower;Hand held shower head;Shower seat;Cane - single point      Prior Function Level of Independence: Needs assistance   Gait / Transfers Assistance Needed: Patient modified independent with SPC in household. Also has rollator, 3 wheel walker, and RW.  ADL's / Homemaking Assistance Needed: Patient has friend from [place] who drives him to appointments. Does not obtain groceries himself.  Hand Dominance        Extremity/Trunk Assessment   Upper Extremity Assessment Upper Extremity Assessment: Generalized weakness    Lower Extremity Assessment Lower Extremity Assessment: Generalized weakness       Communication   Communication: No difficulties  Cognition Arousal/Alertness: Awake/alert Behavior During Therapy: WFL for tasks assessed/performed Overall Cognitive Status:  Within Functional Limits for tasks assessed                                        General Comments      Exercises     Assessment/Plan    PT Assessment Patient needs continued PT services  PT Problem List Decreased strength;Decreased activity tolerance;Decreased balance;Decreased mobility;Pain;Cardiopulmonary status limiting activity       PT Treatment Interventions DME instruction;Gait training;Functional mobility training;Therapeutic activities;Therapeutic exercise;Balance training;Patient/family education    PT Goals (Current goals can be found in the Care Plan section)  Acute Rehab PT Goals Patient Stated Goal: "To get stronger" PT Goal Formulation: With patient Time For Goal Achievement: 11/20/16 Potential to Achieve Goals: Fair    Frequency Min 2X/week   Barriers to discharge Inaccessible home environment;Decreased caregiver support      Co-evaluation               AM-PAC PT "6 Clicks" Daily Activity  Outcome Measure Difficulty turning over in bed (including adjusting bedclothes, sheets and blankets)?: A Lot Difficulty moving from lying on back to sitting on the side of the bed? : A Lot Difficulty sitting down on and standing up from a chair with arms (e.g., wheelchair, bedside commode, etc,.)?: Unable Help needed moving to and from a bed to chair (including a wheelchair)?: Total Help needed walking in hospital room?: Total Help needed climbing 3-5 steps with a railing? : Total 6 Click Score: 8    End of Session Equipment Utilized During Treatment: Oxygen Activity Tolerance: Treatment limited secondary to medical complications (Comment);Other (comment) (Rapid response called) Patient left: in bed;with call bell/phone within reach;with bed alarm set;with nursing/sitter in room   PT Visit Diagnosis: Unsteadiness on feet (R26.81);Pain;Difficulty in walking, not elsewhere classified (R26.2);Muscle weakness (generalized) (M62.81) Pain - part of  body:  (Back/stomach)    Time: 4098-1191 PT Time Calculation (min) (ACUTE ONLY): 23 min   Charges:   PT Evaluation $PT Eval Low Complexity: 1 Low     PT G Codes:   PT G-Codes **NOT FOR INPATIENT CLASS** Functional Assessment Tool Used: AM-PAC 6 Clicks Basic Mobility;Clinical judgement Functional Limitation: Mobility: Walking and moving around Mobility: Walking and Moving Around Current Status (Y7829): At least 80 percent but less than 100 percent impaired, limited or restricted Mobility: Walking and Moving Around Goal Status [id number]): At least 20 percent but less than 40 percent impaired, limited or restricted      Neita Carp, PT, DPT 11/06/2016, 11:59 AM

## 2016-11-06 NOTE — Plan of Care (Signed)
Problem: Safety: Goal: Ability to remain free from injury will improve Outcome: Progressing Pt still with some altered mental status, but is able to make needs known. Bed alarm armed.  Problem: Pain Managment: Goal: General experience of comfort will improve Outcome: Progressing Medicated for back pain with good results.  Problem: Activity: Goal: Risk for activity intolerance will decrease Outcome: Not Progressing Pt still very weak and not able to stand very well.  Problem: Fluid Volume: Goal: Ability to maintain a balanced intake and output will improve Outcome: Progressing Eating and drinking without difficulty, remaining on iv fluids at this time.

## 2016-11-06 NOTE — Progress Notes (Addendum)
Was transferred to ICU as Rapid Response at 1200.Tremors and shaking, anxiety,dyspnea and c/o acute abdominal pain. Temp 103.1. Seen by Dr Sung Amabile. Started on precedex gtt. Blood cx + for klebsiella. On meropenem. Prior to paracentesis today, precedex gtt was stopped due to hypotension and has not been restarted. Paracentesies by interventional radiology was limited to 2L . Peritoneal fluid was clear yellow. Albumin 25% was given for hypotension. By 4pm, pt appeared to be much improved. Temp 99.0.  He has been conversant with several visitors this afternoon. States abdominal pain a" 2 to 3".

## 2016-11-06 NOTE — Clinical Social Work Note (Signed)
Clinical Social Work Assessment  Patient Details  Name: Lawrence West MRN: [id number] Date of Birth: [birth year]  Date of referral:  11/06/16               Reason for consult:  Facility Placement                Permission sought to share information with:  Oceanographer granted to share information::  Yes, Verbal Permission Granted  Name::        Agency::  [facility]  Relationship::     Contact Information:     Housing/Transportation Living arrangements for the past 2 months:  Single Family Home Source of Information:  Medical Team, Power of [place], Engineer, mining, Other (Comment Required) (Sister) Patient Interpreter Needed:  None Criminal Activity/Legal Involvement Pertinent to Current Situation/Hospitalization:  No - Comment as needed Significant Relationships:  Merchandiser, retail, Siblings, Friend Lives with:  Self Do you feel safe going back to the place where you live?  Yes Need for family participation in patient care:  Yes (Comment) (At this time, AMS)  Care giving concerns:  Pt recommendation pending; Possible need for STR  Social Worker assessment / plan:  CSW spoke with the patient's secondary HCPOA, Lawrence West, at his request. Lawrence West updated the CSW of the patient's primary HCPOA information and reported that he and his primary HCPOA are attempting to prepare for possible LTC needs. The CSW explained the difference between STR and LTC. Lawrence West asked that the CSW also contact the primary HCPOA with further information.  The CSW contacted Lawrence West, the patient's sister and primary HCPOA to discuss discharge planning and update her on the patient's care. At baseline, the patient lives alone and receives hospice at home through [facility] with RN twice a week and CNA twice a week. Lawrence West, who lives in [place], assists with transporting the patient to procedures outside of the home. Lawrence West lives in [place] and handles the  insurance and financial aspects of the patient's care. Lawrence West gave verbal permission to conduct a STR referral and was receptive to the CSW's suggestion of referring to [place] area SNFs due to the location of his support.   PT is pending at this time. The CSW will update Lawrence West of PT recommendations as they are available. CSW has updated the the demographics for the patient to reflect the appropriate contact information for Lawrence West (primary) and Lawrence West (secondary).  Employment status:  Retired Health and safety inspector:  Medicare PT Recommendations:  Not assessed at this time Information / Referral to community resources:  Skilled Nursing Facility  Patient/Family's Response to care:  The patient was lethargic. The patient's family thanked the CSW.  Patient/Family's Understanding of and Emotional Response to Diagnosis, Current Treatment, and Prognosis:  The patient's family understands that the disease is progressive in nature and terminal. The family is preparing for possible LTC and are agreeable to STR should PT recommend such.  Emotional Assessment Appearance:  Appears stated age Attitude/Demeanor/Rapport:  Lethargic Affect (typically observed):  Pleasant Orientation:  Oriented to Self Alcohol / Substance use:  Never Used Psych involvement (Current and /or in the community):  No (Comment)  Discharge Needs  Concerns to be addressed:  Care Coordination, Discharge Planning Concerns Readmission within the last 30 days:  Yes Current discharge risk:  Chronically ill Barriers to Discharge:  Continued Medical Work up   [employer], [name] 11/06/2016, 10:58 AM

## 2016-11-06 NOTE — Care Management Obs Status (Signed)
MEDICARE OBSERVATION STATUS NOTIFICATION   Patient Details  Name: CHEROKEE CLOWERS MRN: [id number] Date of Birth: 05-Dec-1945   Medicare Observation Status Notification Given:  Yes    Aldyn Toon A, RN 11/06/2016, 1:45 PM

## 2016-11-06 NOTE — Progress Notes (Signed)
PHARMACY - PHYSICIAN COMMUNICATION CRITICAL VALUE ALERT - BLOOD CULTURE IDENTIFICATION (BCID)  Results for orders placed or performed during the hospital encounter of 11/05/16  Blood Culture ID Panel (Reflexed) (Collected: 11/05/2016  2:30 PM)  Result Value Ref Range   Enterococcus species NOT DETECTED NOT DETECTED   Listeria monocytogenes NOT DETECTED NOT DETECTED   Staphylococcus species NOT DETECTED NOT DETECTED   Staphylococcus aureus NOT DETECTED NOT DETECTED   Streptococcus species NOT DETECTED NOT DETECTED   Streptococcus agalactiae NOT DETECTED NOT DETECTED   Streptococcus pneumoniae NOT DETECTED NOT DETECTED   Streptococcus pyogenes NOT DETECTED NOT DETECTED   Acinetobacter baumannii NOT DETECTED NOT DETECTED   Enterobacteriaceae species DETECTED (A) NOT DETECTED   Enterobacter cloacae complex NOT DETECTED NOT DETECTED   Escherichia coli NOT DETECTED NOT DETECTED   Klebsiella oxytoca NOT DETECTED NOT DETECTED   Klebsiella pneumoniae DETECTED (A) NOT DETECTED   Proteus species NOT DETECTED NOT DETECTED   Serratia marcescens NOT DETECTED NOT DETECTED   Carbapenem resistance NOT DETECTED NOT DETECTED   Haemophilus influenzae NOT DETECTED NOT DETECTED   Neisseria meningitidis NOT DETECTED NOT DETECTED   Pseudomonas aeruginosa NOT DETECTED NOT DETECTED   Candida albicans NOT DETECTED NOT DETECTED   Candida glabrata NOT DETECTED NOT DETECTED   Candida krusei NOT DETECTED NOT DETECTED   Candida parapsilosis NOT DETECTED NOT DETECTED   Candida tropicalis NOT DETECTED NOT DETECTED    Name of physician (or Provider) Contacted: Pavan Pyreddy  Changes to prescribed antibiotics required: Starting cefepime 2g IV q8h x Kleb pneumo bacteremia--4/4 bottles GS: GNR.  Thomasene Ripple, PharmD, BCPS Clinical Pharmacist 11/06/2016

## 2016-11-06 NOTE — Care Management Note (Signed)
Case Management Note  Patient Details  Name: NICOLUS OSE MRN: [id number] Date of Birth: 1945/09/03  Subjective/Objective:      Per Mr Yoshimura's HCPOA [place], he is followed at home by [facility].               Action/Plan:   Expected Discharge Date:  11/07/16               Expected Discharge Plan:     In-House Referral:     Discharge planning Services     Post Acute Care Choice:    Choice offered to:     DME Arranged:    DME Agency:     HH Arranged:    HH Agency:     Status of Service:     If discussed at [employer], dates discussed:    Additional Comments:  Adelma Bowdoin A, RN 11/06/2016, 10:49 AM

## 2016-11-06 NOTE — Significant Event (Signed)
Rapid Response Event Note  Overview: called for rapid response due to resp distress/ tremors/ dt's?      Initial Focused Assessment: pt laying in bed, slightly confused, hx ETOH with paracentesis previous day, in state of obvious DT's.   Interventions: Dr Elisabeth Pigeon in room, transfer to stepdown for precedex drip. Dr Sung Amabile aware.  Plan of Care (if not transferred):  Event Summary:   at      at          [facility] A

## 2016-11-06 NOTE — Consult Note (Signed)
PULMONARY / CRITICAL CARE MEDICINE   Name: Lawrence West MRN: [id number] DOB: [birth year]    ADMISSION DATE:  11/05/2016  PT PROFILE: 60 M severe alcoholic cirrhosis and chronic, recurrent ascites. He underwent therapeutic paracentesis 09/28 with 7 L of ascites removed. He [facility] ED 09/28 with AMS, increased tremors, abdominal pain. Blood cultures were obtained on admission and grew klebsiella. Working diagnosis is SBP. Transferred to ICU/SDU 09/29 with worsening agitation, labile BP and increased abdominal pain.  MAJOR EVENTS/TEST RESULTS: 09/28 Admitted as above 09/28 CT head: No acute findings 09/29 transferred to ICU/SDU with diagnosis of severe sepsis, SBP, klebsiella bacteremia, agitation 09/29 paracentesis:  INDWELLING DEVICES::   MICRO DATA: Blood 09/28 >> klebsiella pneumonia  ANTIMICROBIALS:  Meropenem 09/29 >>      HISTORY OF PRESENT ILLNESS:   As above. Upon transfer to ICU/SDU he is confused and agitated and unable to provide significant history other than complaint of abdominal pain.  PAST MEDICAL HISTORY :  He  has a past medical history of Arthritis; Ascites; Avascular necrosis of bone of left hip (HCC) (07/08/2015); Brown recluse spider bite (1990s); Cirrhosis of liver (HCC); Depression; Edema of both legs; Gout (X 1); High cholesterol; Hypertension; Narcolepsy; Rheumatic fever; and Sleep apnea.  PAST SURGICAL HISTORY: He  has a past surgical history that includes Cosmetic surgery (~ 1997); Total knee arthroplasty (Left, 03/2008); US GUIDED PARACENTESIS [facility] HX) (April 2017); Total hip arthroplasty (Left, 07/08/2015); Tonsillectomy (1954); Inguinal hernia repair (Bilateral, 1990s); Foot fracture surgery (Right); Fracture surgery; Total hip arthroplasty (Left, 07/08/2015); and Esophagogastroduodenoscopy (N/A, 12/27/2015).  No Known Allergies  No current facility-administered medications on file prior to encounter.    Current Outpatient Prescriptions  on File Prior to Encounter  Medication Sig  . atorvastatin (LIPITOR) 10 MG tablet Take 1 tablet (10 mg total) by mouth daily.  . furosemide (LASIX) 20 MG tablet Take 20 mg by mouth daily.  Marland Kitchen lamoTRIgine (LAMICTAL) 25 MG tablet Take 25 mg by mouth 2 (two) times daily.   . pantoprazole (PROTONIX) 40 MG tablet Take 1 tablet (40 mg total) by mouth 2 (two) times daily.  . rifaximin (XIFAXAN) 550 MG TABS tablet Take 1 tablet (550 mg total) by mouth 2 (two) times daily.  Marland Kitchen spironolactone (ALDACTONE) 25 MG tablet Take 1 tablet (25 mg total) by mouth daily.  . traMADol (ULTRAM) 50 MG tablet Take 1 tablet (50 mg total) by mouth every 6 (six) hours as needed for severe pain.  Marland Kitchen bismuth subsalicylate (PEPTO BISMOL) 262 MG/15ML suspension Take 30 mLs by mouth every 6 (six) hours as needed.  . lactulose (CHRONULAC) 10 GM/15ML solution Take 30 mLs (20 g total) by mouth 2 (two) times daily. (Patient not taking: Reported on 11/05/2016)  . ondansetron (ZOFRAN) 4 MG tablet Take 1 tablet (4 mg total) by mouth every 8 (eight) hours as needed for nausea or vomiting.    FAMILY HISTORY:  His indicated that his mother is alive. He indicated that his father is deceased.    SOCIAL HISTORY: He  reports that he has never smoked. He has never used smokeless tobacco. He reports that he drinks alcohol. He reports that he does not use drugs.  REVIEW OF SYSTEMS:   Level V caveat  SUBJECTIVE:    VITAL SIGNS: BP (!) 82/55   Pulse 73   Temp 99.7 F (37.6 C) (Oral)   Resp (!) 24   Ht  (1.778 m)   Wt 199 lb 15.3 oz (90.7  kg)   SpO2 91%   BMI 28.69 kg/m   HEMODYNAMICS:    VENTILATOR SETTINGS:    INTAKE / OUTPUT: No intake/output data recorded.  PHYSICAL EXAMINATION: General: Intermittently agitated, poorly oriented, no overt respiratory distress Neuro: CNs intact, MAEs, DTRs symmetric HEENT: NCAT, + sclericterus Cardiovascular: Regular, no M Lungs: Clear anteriorly without adventitious  sounds Abdomen: Moderately distended, firm but not rigid, diffusely and mildly-moderately tender Ext: Warm, no edema Skin: Jaundice  LABS:  BMET  Recent Labs Lab 11/05/16 1442 11/06/16 0355  NA 133* 135  K 4.2 4.0  CL 99* 103  CO2 28 24  BUN 11 14  CREATININE 0.98 1.03  GLUCOSE 117* 141*    Electrolytes  Recent Labs Lab 11/05/16 1442 11/06/16 0355  CALCIUM 8.7* 8.2*    CBC  Recent Labs Lab 11/05/16 1442 11/06/16 0355  WBC 8.1 12.1*  HGB 14.0 12.9*  HCT 40.9 37.4*  PLT 84* 66*    Coag's No results for input(s): APTT, INR in the last 168 hours.  Sepsis Markers  Recent Labs Lab 11/05/16 1442 11/05/16 1846  LATICACIDVEN 3.0* 2.0*    ABG No results for input(s): PHART, PCO2ART, PO2ART in the last 168 hours.  Liver Enzymes  Recent Labs Lab 11/05/16 1442  AST 75*  ALT 26  ALKPHOS 95  BILITOT 5.3*  ALBUMIN 3.5    Cardiac Enzymes  Recent Labs Lab 11/05/16 1442  TROPONINI <0.03    Glucose  Recent Labs Lab 11/05/16 1403 11/05/16 1444  GLUCAP 138* 108*    CXR: No acute cardiac or pulmonary findings   ASSESSMENT / PLAN:  INFECTIOUS A:   Severe sepsis SBP Klebsiella bacteremia P:   Monitor temp, WBC count Micro and abx as above   NEUROLOGIC A:   Acute on chronic hepatic encephalopathy Agitated delirium H/O alcohol abuse - unclear if still active (he denies) P:   RASS goal: 0 Dexmedetomidine infusion Low dose PRN lorazepam  GASTROINTESTINAL A:   End-stage, alcoholic cirrhosis Abdominal distention and pain P:   SUP: Enteral pantoprazole Advance diet as able Avoid hepatotoxins (statin and acetaminophen discontinued) Paracentesis ordered for diagnostic and therapeutic purposes  PULMONARY A: Borderline hypoxemia which does not improve with oxygen therapy.   Suspect hepatopulmonary syndrome P:   Supplemental oxygen as needed to keep SPO2 >90%  CARDIOVASCULAR A:  Labile BP - possible mild septic shock P:   MAP goal > 65 mmHg Will use norepinephrine as needed to maintain MAP goal  RENAL A:   At risk for AKI due to sepsis and liver disease P:   Monitor BMET intermittently Monitor I/Os Correct electrolytes as indicated   HEMATOLOGIC A:   Chronic thrombocytopenia Mild hepatic coagulopathy P:  DVT px: SCDs Monitor CBC intermittently Transfuse per usual guidelines   ENDOCRINE A:   Mild hyperglycemia At risk for hypoglycemia due to liver disease P:   Monitor glucose on chemistry panels Consider SSI for glucose >180   Billy Fischer, MD PCCM service Mobile [phone] Pager [phone] 11/06/2016 3:05 PM  11/06/2016, 2:45 PM

## 2016-11-06 NOTE — Progress Notes (Signed)
Pharmacy Antibiotic Note  Lawrence West is a 71 y.o. male admitted on 11/05/2016 with bacteremia due to Klebsiella Pneumoniae.  Pharmacy has been consulted for meropenem dosing.  Plan: Will initiate meropenem 1g IV Q8hr.   Height:  (177.8 cm) Weight: 200 lb (90.7 kg) IBW/kg (Calculated) : 73  Temp (24hrs), Avg:99.8 F (37.7 C), Min:98.2 F (36.8 C), Max:102.9 F (39.4 C)   Recent Labs Lab 11/05/16 1442 11/05/16 1846 11/06/16 0355  WBC 8.1  --  12.1*  CREATININE 0.98  --  1.03  LATICACIDVEN 3.0* 2.0*  --     Estimated Creatinine Clearance: 75.6 mL/min (by C-G formula based on SCr of 1.03 mg/dL).    No Known Allergies  Antimicrobials this admission: cefepime 9/29 x 1  meropenem 9/29 >>   Dose adjustments this admission: N/A  Microbiology results: 9/28 BCx: Gram negative rods; Klebsiella Pneumoniae on BCID  Thank you for allowing pharmacy to be a part of this patient's care.  Simpson,Michael L 11/06/2016 11:34 AM

## 2016-11-06 NOTE — Progress Notes (Signed)
Pt suddenly began shivering all over and complaining of severe pain to abdomen and back. Vs checked, O2 at 87% on 2L via N/C. O2 increased to 5L N/C with no result. Paged MD and RT. Non-rebreather mask placed per RT, O2 increased to 91%. RRT called, MD paged. CCU nurse at bedside. Pt will be transferred to ICU RM 1.

## 2016-11-06 NOTE — Progress Notes (Signed)
Attempted to call HC-POA listed on chart to notify of pt transfer to ICU, listed # is disconnected. LMOM to secondary person listed (Darren) to please call back.

## 2016-11-06 NOTE — Care Management Note (Addendum)
Case Management Note  Patient Details  Name: KAYLON HITZ MRN: [id number] Date of Birth: May 07, 1945  Subjective/Objective:    Transferred from 1A to ICU per possible sepsis. Decreased 02 SAT, fever, tremors. Changed from OBS to Inpatient.                Action/Plan:   Expected Discharge Date:  11/07/16               Expected Discharge Plan:     In-House Referral:     Discharge planning Services     Post Acute Care Choice:    Choice offered to:     DME Arranged:    DME Agency:     HH Arranged:    HH Agency:     Status of Service:     If discussed at [employer], dates discussed:    Additional Comments:  Aarti Mankowski A, RN 11/06/2016, 12:27 PM

## 2016-11-07 ENCOUNTER — Inpatient Hospital Stay: Payer: Medicare Other

## 2016-11-07 DIAGNOSIS — R0603 Acute respiratory distress: Secondary | ICD-10-CM

## 2016-11-07 DIAGNOSIS — K7031 Alcoholic cirrhosis of liver with ascites: Secondary | ICD-10-CM

## 2016-11-07 DIAGNOSIS — R7881 Bacteremia: Secondary | ICD-10-CM

## 2016-11-07 LAB — COMPREHENSIVE METABOLIC PANEL
ALT: 20 U/L (ref 17–63)
AST: 66 U/L — AB (ref 15–41)
Albumin: 2.5 g/dL — ABNORMAL LOW (ref 3.5–5.0)
Alkaline Phosphatase: 54 U/L (ref 38–126)
Anion gap: 6 (ref 5–15)
BUN: 21 mg/dL — AB (ref 6–20)
CHLORIDE: 103 mmol/L (ref 101–111)
CO2: 22 mmol/L (ref 22–32)
Calcium: 7.9 mg/dL — ABNORMAL LOW (ref 8.9–10.3)
Creatinine, Ser: 1.13 mg/dL (ref 0.61–1.24)
GFR calc Af Amer: >60 mL/min (ref >60)
Glucose, Bld: 120 mg/dL — ABNORMAL HIGH (ref 65–99)
POTASSIUM: 3.8 mmol/L (ref 3.5–5.1)
SODIUM: 131 mmol/L — AB (ref 135–145)
Total Bilirubin: 3.7 mg/dL — ABNORMAL HIGH (ref 0.3–1.2)
Total Protein: 5.4 g/dL — ABNORMAL LOW (ref 6.5–8.1)

## 2016-11-07 LAB — CBC
HEMATOCRIT: 34.3 % — AB (ref 40.0–52.0)
HEMOGLOBIN: 11.7 g/dL — AB (ref 13.0–18.0)
MCH: 33.5 pg (ref 26.0–34.0)
MCHC: 34.1 g/dL (ref 32.0–36.0)
MCV: 98.3 fL (ref 80.0–100.0)
PLATELETS: 60 10*3/uL — AB (ref 150–440)
RBC: 3.49 MIL/uL — ABNORMAL LOW (ref 4.40–5.90)
RDW: 16.7 % — ABNORMAL HIGH (ref 11.5–14.5)
WBC: 11.3 10*3/uL — AB (ref 3.8–10.6)

## 2016-11-07 LAB — PROTIME-INR
INR: 1.75
PROTHROMBIN TIME: 20.3 s — AB (ref 11.4–15.2)

## 2016-11-07 LAB — APTT: aPTT: 44 seconds — ABNORMAL HIGH (ref 24–36)

## 2016-11-07 MED ORDER — IPRATROPIUM-ALBUTEROL 0.5-2.5 (3) MG/3ML IN SOLN
3.0000 mL | Freq: Four times a day (QID) | RESPIRATORY_TRACT | Status: DC | PRN
  Administered 2016-11-07: 3 mL via RESPIRATORY_TRACT
  Filled 2016-11-07: qty 3

## 2016-11-07 MED ORDER — OXYCODONE HCL 5 MG PO TABS: 5.0000 mg | ORAL_TABLET | Freq: Four times a day (QID) | ORAL | Status: DC | PRN

## 2016-11-07 MED ORDER — HYDROCODONE-ACETAMINOPHEN 5-325 MG PO TABS
1.0000 | ORAL_TABLET | Freq: Four times a day (QID) | ORAL | Status: DC | PRN
  Administered 2016-11-07 – 2016-11-10 (×6): 1 via ORAL
  Filled 2016-11-07 (×6): qty 1

## 2016-11-07 MED ORDER — INFLUENZA VAC SPLIT HIGH-DOSE 0.5 ML IM SUSY
0.5000 mL | PREFILLED_SYRINGE | INTRAMUSCULAR | Status: AC
  Administered 2016-11-08: 0.5 mL via INTRAMUSCULAR
  Filled 2016-11-07: qty 0.5

## 2016-11-07 NOTE — Progress Notes (Signed)
Patient was afebrile during shift.  PRN Tramadol given for back pain.  Able to make needs known. No acute distress noted.  Very talkative.  No c/o abdominal pain.

## 2016-11-07 NOTE — Progress Notes (Signed)
Pharmacy Antibiotic Note  Lawrence West is a 71 y.o. male admitted on 11/05/2016 with bacteremia due to Klebsiella Pneumoniae.  Pharmacy has been consulted for meropenem dosing.  Plan: Will continue meropenem 1g IV Q8hr.   Will follow susceptibilities for opportunities to narrow antibiotic therapy.   Height:  (177.8 cm) Weight: 199 lb 15.3 oz (90.7 kg) IBW/kg (Calculated) : 73  Temp (24hrs), Avg:99.3 F (37.4 C), Min:97.9 F (36.6 C), Max:103.2 F (39.6 C)   Recent Labs Lab 11/05/16 1442 11/05/16 1846 11/06/16 0355 11/07/16 0543  WBC 8.1  --  12.1* 11.3*  CREATININE 0.98  --  1.03 1.13  LATICACIDVEN 3.0* 2.0*  --   --     Estimated Creatinine Clearance: 68.9 mL/min (by C-G formula based on SCr of 1.13 mg/dL).    No Known Allergies  Antimicrobials this admission: cefepime 9/29 x 1  meropenem 9/29 >>   Dose adjustments this admission: N/A  Microbiology results: 9/28 BCx: Gram negative rods; Klebsiella Pneumoniae on BCID  Thank you for allowing pharmacy to be a part of this patient's care.  Simpson,Michael L 11/07/2016 10:29 AM

## 2016-11-07 NOTE — Progress Notes (Signed)
[facility] - [place] [facility]   PATIENT NAME: Cort Dragoo    MR#:  [id number]  DATE OF BIRTH:  09-Jul-1945  SUBJECTIVE:  CHIEF COMPLAINT:   Chief Complaint  Patient presents with  . Weakness     Liver cirrhosis and recurrent ascites and paracentesis every week. Had done yesterday with 7.3 ltr fluid removed. Now came with chills , shaking.  Noted to be septic and have bacteremia.   Improved after 2 more ltr fluid removed by IR.  REVIEW OF SYSTEMS:  CONSTITUTIONAL: No fever, fatigue or weakness.  EYES: No blurred or double vision.  EARS, NOSE, AND THROAT: No tinnitus or ear pain.  RESPIRATORY: No cough, shortness of breath, wheezing or hemoptysis.  CARDIOVASCULAR: No chest pain, orthopnea, edema.  GASTROINTESTINAL: No nausea, vomiting, diarrhea, some abdominal pain.  GENITOURINARY: No dysuria, hematuria.  ENDOCRINE: No polyuria, nocturia,  HEMATOLOGY: No anemia, easy bruising or bleeding SKIN: No rash or lesion. MUSCULOSKELETAL: No joint pain or arthritis.   NEUROLOGIC: No tingling, numbness, weakness.  PSYCHIATRY: No anxiety or depression.   ROS  DRUG ALLERGIES:  No Known Allergies  VITALS:  Blood pressure 101/68, pulse 77, temperature 98.5 F (36.9 C), temperature source Oral, resp. rate 18, height  (1.778 m), weight 90.7 kg (199 lb 15.3 oz), SpO2 94 %.  PHYSICAL EXAMINATION:  GENERAL:  71 y.o.-year-old patient lying in the bed with no acute distress earlier,  EYES: Pupils equal, round, reactive to light and accommodation. No scleral icterus. Extraocular muscles intact.  HEENT: Head atraumatic, normocephalic. Oropharynx and nasopharynx clear.  NECK:  Supple, no jugular venous distention. No thyroid enlargement, no tenderness.  LUNGS: Normal breath sounds bilaterally, no wheezing, some crepitation. No use of accessory muscles of respiration .  CARDIOVASCULAR: S1, S2 normal. No murmurs, rubs, or gallops.  ABDOMEN: Soft, nontender, distended.  Bowel sounds present. No organomegaly or mass.  EXTREMITIES: No pedal edema, cyanosis, or clubbing.  NEUROLOGIC: Cranial nerves II through XII are intact. Muscle strength 3-5/5 in all extremities. Sensation intact. Gait not checked. Mild shaking. PSYCHIATRIC: The patient is alert and oriented x 3.  SKIN: No obvious rash, lesion, or ulcer.   Physical Exam LABORATORY PANEL:   CBC  Recent Labs Lab 11/07/16 0543  WBC 11.3*  HGB 11.7*  HCT 34.3*  PLT 60*   ------------------------------------------------------------------------------------------------------------------  Chemistries   Recent Labs Lab 11/07/16 0543  NA 131*  K 3.8  CL 103  CO2 22  GLUCOSE 120*  BUN 21*  CREATININE 1.13  CALCIUM 7.9*  AST 66*  ALT 20  ALKPHOS 54  BILITOT 3.7*   ------------------------------------------------------------------------------------------------------------------  Cardiac Enzymes  Recent Labs Lab 11/05/16 1442  TROPONINI <0.03   ------------------------------------------------------------------------------------------------------------------  RADIOLOGY:  Dg Chest 1 View  Result Date: 11/05/2016 CLINICAL DATA:  Shortness of breath EXAM: CHEST 1 VIEW COMPARISON:  06/18/2016, CT chest 05/30/2008, 12/27/2015, 12/31/2015 FINDINGS: No acute consolidation or effusion. Stable cardiomediastinal silhouette ; retrocardiac opacity similar to 06/18/2016, possibly due to hiatal hernia or tortuous aorta. No pneumothorax. IMPRESSION: No acute infiltrate or edema. Electronically Signed   By: Jasmine Pang M.D.   On: 11/05/2016 15:09   Ct Head Wo Contrast  Result Date: 11/05/2016 CLINICAL DATA:  Altered level of consciousness. EXAM: CT HEAD WITHOUT CONTRAST TECHNIQUE: Contiguous axial images were obtained from the base of the skull through the vertex without intravenous contrast. COMPARISON:  CT head 06/06/2016 FINDINGS: Brain: Moderate atrophy. Negative for hydrocephalus. Negative for acute  infarct, hemorrhage, or mass lesion.  No fluid collection or shift of the midline structures. Vascular: Negative for hyperdense vessel Skull: Negative Sinuses/Orbits: Negative Other: None IMPRESSION: Generalized atrophy.  No acute abnormality. Electronically Signed   By: Marlan Palau M.D.   On: 11/05/2016 15:48   US Paracentesis  Result Date: 11/06/2016 INDICATION: Patient admitted with tremors yesterday after outpatient paracentesis. Patient's history is for to the ICU secondary to sepsis. Request is made for diagnostic and therapeutic paracentesis with a maximum of 2 L to rule out spontaneous bacterial peritonitis. 25 g of albumin was requested by the attending ICU physician secondary to hypotension. EXAM: ULTRASOUND GUIDED DIAGNOSTIC AND THERAPEUTIC PARACENTESIS MEDICATIONS: 1% xylocaine COMPLICATIONS: None immediate. PROCEDURE: Informed written consent was obtained from the patient after a discussion of the risks, benefits and alternatives to treatment. A timeout was performed prior to the initiation of the procedure. Initial ultrasound scanning demonstrates a small amount of ascites within the right upper abdominal quadrant. The right upper abdomen was prepped and draped in the usual sterile fashion. 1% xylocaine was used for local anesthesia. Following this, a 19 gauge, 7-cm, Yueh catheter was introduced. An ultrasound image was saved for documentation purposes. The paracentesis was performed. The catheter was removed and a dressing was applied. The patient tolerated the procedure well without immediate post procedural complication. His blood pressure remains around 80/50 the entire procedure. FINDINGS: A total of approximately 2 L of clear, serous fluid was removed. Samples were sent to the laboratory as requested by the clinical team. IMPRESSION: Successful ultrasound-guided paracentesis yielding 2 liters of peritoneal fluid. Samples were sent to the laboratory as requested by the providing clinical  team. Read by: Barnetta Chapel, PA-C Electronically Signed   By: Simonne Come M.D.   On: 11/06/2016 15:01   US Paracentesis  Result Date: 11/05/2016 INDICATION: History of alcoholic cirrhosis with recurrent abdominal ascites. Request is made for therapeutic paracentesis. EXAM: ULTRASOUND GUIDED THERAPEUTIC PARACENTESIS MEDICATIONS: 1% xylocaine COMPLICATIONS: None immediate. PROCEDURE: Informed written consent was obtained from the patient after a discussion of the risks, benefits and alternatives to treatment. A timeout was performed prior to the initiation of the procedure. Initial ultrasound scanning demonstrates a large amount of ascites within the right lower abdominal quadrant. The right lower abdomen was prepped and draped in the usual sterile fashion. 1% xylocaine was used for local anesthesia. Following this, a Safe-T-Centesis catheter was introduced. An ultrasound image was saved for documentation purposes. The paracentesis was performed. The catheter was removed and a dressing was applied. When the procedure was over the nurse and ultrasound technologist noticed that the patient was shaking. They also felt like he had an altered mentation from prior to his procedure. His blood pressure was normal and his glucose was found to be 130. The patient did state that he had been having a lot of diarrhea from his lactulose. The patient was evaluated and the recommendation was made for him to be evaluated in the emergency department. There were no immediate complications from the procedure itself. FINDINGS: A total of approximately 7.3 L of serous fluid was removed. IMPRESSION: Successful ultrasound-guided paracentesis yielding 7.3 L liters of peritoneal fluid. Please see above for further details regarding postprocedure disposition. Read by: Barnetta Chapel, PA-C Electronically Signed   By: Richarda Overlie M.D.   On: 11/05/2016 14:51   Dg Chest Port 1 View  Result Date: 11/07/2016 CLINICAL DATA:  Hypoxia EXAM:  PORTABLE CHEST 1 VIEW COMPARISON:  11/06/2016 and prior radiographs FINDINGS: Cardiomegaly, interstitial prominence and oval retrocardiac opacity  are unchanged. There is no evidence of pleural effusion, pneumothorax or airspace disease. No significant change from prior study. IMPRESSION: Unchanged appearance of the chest with cardiomegaly and stable left retrocardiac opacity. Electronically Signed   By: Harmon Pier M.D.   On: 11/07/2016 08:13   Dg Chest Port 1 View  Result Date: 11/06/2016 CLINICAL DATA:  Hypoxia. EXAM: PORTABLE CHEST 1 VIEW COMPARISON:  11/05/2016.  Chest CT dated 05/30/2008. FINDINGS: Stable enlarged cardiac silhouette with a prominent left ventricular contour. Stable left retrocardiac oval density. Clear lungs with prominent interstitial markings without significant change when a decreased depth of inspiration is taken into account. Thoracic spine and bilateral shoulder degenerative changes. IMPRESSION: 1. No acute abnormality. 2. Stable cardiomegaly with probable left ventricular hypertrophy. 3. Stable left retrocardiac opacity, possibly representing a hiatal hernia not present on the previous CT. Electronically Signed   By: Beckie Salts M.D.   On: 11/06/2016 12:50    ASSESSMENT AND PLAN:   Active Problems:   Altered mental status   Respiratory distress  71 year old male with past medical history of alcoholic liver cirrhosis, hypertension, depression, GERD, obstructive sleep apnea, previous history of hepatic encephalopathy presents to the hospital due to tremors, altered mental status after paracentesis.  1. Sepsis, due to SBP, bacteremia   Ac hypoxic respi failure.  Altered mental status/weakness/tremor-  UA and Xray chest negative.  meropenem.  Spoke to Intensivist and [employer], 2 ltr Fluid removed 11/06/16- no high WBCs.  2. History of hepatic encephalopathy-ammonia level close to baseline. Continue lactulose, Xifaxan.  3. Depression-continue Lexapro.  4.  Hyperlipidemia-continue atorvastatin.  5. GERD-continue Protonix.  6. Hx of Alcoholic Liver Cirrhosis - cont. Aldactone, Lasix, Lactulose, Xifaxan.      He drinks a lot of liquids and ice at home, councelled about fluid restrictions.    All the records are reviewed and case discussed with Care Management/Social Workerr. Management plans discussed with the patient, family and they are in agreement.  CODE STATUS: Full.  TOTAL TIME TAKING CARE OF THIS PATIENT: 35 minutes.   POSSIBLE D/C IN 2-3 DAYS, DEPENDING ON CLINICAL CONDITION.   Altamese Dilling M.D on 11/07/2016   Between 7am to 6pm - Pager - [phone]  After 6pm go to [email or web address] - password [email or web address]  [facility]  Office  [phone]  CC: Primary care physician; Kandyce Rud, MD  Note: This dictation was prepared with Dragon dictation along with smaller phrase technology. Any transcriptional errors that result from this process are unintentional.

## 2016-11-07 NOTE — Clinical Social Work Note (Signed)
CSW spoke at length with the patient, his sister, his brother-in-law, and his friend Arlys John about discharge planning. Currently, the patient is agreeable to SNF placement with a preference for [facility]. The patient seems to be progressing. CSW will continue with STR referral and is following.  Argentina Ponder, MSW, Theresia Majors [phone]

## 2016-11-07 NOTE — Progress Notes (Signed)
PULMONARY / CRITICAL CARE MEDICINE   Name: Lawrence West MRN: [id number] DOB: 1945-05-04    ADMISSION DATE:  11/05/2016  PT PROFILE: 55 M severe alcoholic cirrhosis and chronic, recurrent ascites. He underwent therapeutic paracentesis 09/28 with 7 L of ascites removed. He presented to the [facility] ED 09/28 with AMS, increased tremors, abdominal pain. Blood cultures were obtained on admission and grew klebsiella. Working diagnosis is SBP. Transferred to ICU/SDU 09/29 with worsening agitation, labile BP and increased abdominal pain.  MAJOR EVENTS/TEST RESULTS: 09/28 Admitted as above 09/28 CT head: No acute findings 09/29 transferred to ICU/SDU with diagnosis of severe sepsis, SBP, klebsiella bacteremia, agitation 09/29 paracentesis: 2 liters of serous fluid removed, WBC 93/cu mm, 22% neutrophils  INDWELLING DEVICES::   MICRO DATA: Blood 09/28 >> klebsiella pneumonia  ANTIMICROBIALS:  Meropenem 09/29 >>     SUBJECTIVE:  Much improved. Cognition intact. No distress. He is still concerned about the sensation of abdominal distention. Denies abdominal pain.  VITAL SIGNS: BP 101/68 (BP Location: Right Arm)   Pulse 77   Temp 98.5 F (36.9 C) (Oral)   Resp 18   Ht  (1.778 m)   Wt 199 lb 15.3 oz (90.7 kg)   SpO2 94%   BMI 28.69 kg/m   HEMODYNAMICS:    VENTILATOR SETTINGS:    INTAKE / OUTPUT: I/O last 3 completed shifts: In: 1925.7 [P.O.:1800; I.V.:25.7; IV Piggyback:100] Out: 350 [Urine:350]  PHYSICAL EXAMINATION: General: NAD Neuro: No focal deficits HEENT: NCAT, mild sclericterus Cardiovascular: Regular, no M Lungs: Clear anteriorly without adventitious sounds Abdomen: Moderately distended, firm, not rigid, nontender Ext: Warm, no edema Skin: Jaundice  LABS:  BMET  Recent Labs Lab 11/05/16 1442 11/06/16 0355 11/07/16 0543  NA 133* 135 131*  K 4.2 4.0 3.8  CL 99* 103 103  CO2 BUN 11 14 21*  CREATININE 0.98 1.03 1.13  GLUCOSE 117* 141*  120*    Electrolytes  Recent Labs Lab 11/05/16 1442 11/06/16 0355 11/07/16 0543  CALCIUM 8.7* 8.2* 7.9*    CBC  Recent Labs Lab 11/05/16 1442 11/06/16 0355 11/07/16 0543  WBC 8.1 12.1* 11.3*  HGB 14.0 12.9* 11.7*  HCT 40.9 37.4* 34.3*  PLT 84* 66* 60*    Coag's  Recent Labs Lab 11/07/16 0543  APTT 44*  INR 1.75    Sepsis Markers  Recent Labs Lab 11/05/16 1442 11/05/16 1846  LATICACIDVEN 3.0* 2.0*    ABG No results for input(s): PHART, PCO2ART, PO2ART in the last 168 hours.  Liver Enzymes  Recent Labs Lab 11/05/16 1442 11/07/16 0543  AST 75* 66*  ALT 26 20  ALKPHOS 95 54  BILITOT 5.3* 3.7*  ALBUMIN 3.5 2.5*    Cardiac Enzymes  Recent Labs Lab 11/05/16 1442  TROPONINI <0.03    Glucose  Recent Labs Lab 11/05/16 1403 11/05/16 1444  GLUCAP 138* 108*    CXR: NNF   ASSESSMENT / PLAN: Agitated delirium - resolved Chronic hepatic encephalopathy H/O alcohol abuse - unclear if still active (he denies) Klebsiella bacteremia Severe sepsis Doubt SBP - fluid characteristics are not c/w SBP Alcoholic cirrhosis Abdominal distention - minimal ascites now after 9 liters removed in past 2 days Borderline hypoxemia  Hypotension, resolved Chronic thrombocytopenia Mild hepatic coagulopathy  Continue meropenem - will likely be able to narrow coverage once sensitivities are available Continue the rest of his medical regimen as previously. This is been thoroughly reviewed by me this morning Advance activity Transfer to MedSurg  After  transfer, PCCM will sign off. Please call if we can be of further assistance   Billy Fischer, MD PCCM service Mobile [phone] Pager [phone] 11/07/2016 12:24 PM

## 2016-11-07 NOTE — Progress Notes (Addendum)
Patient requesting hydrocodone instead of oxycodone. MD Elisabeth Pigeon paged. Verbal order to discontinue oxycodone and put order for PRN hydrocodone. No complaints at this time. Will continue to monitor.

## 2016-11-07 NOTE — Progress Notes (Signed)
Pt being transferred to room 151. Report called to Shanda Bumps, Charity fundraiser. Pt and belongings transferred to room 151 without incident.

## 2016-11-07 NOTE — NC FL2 (Signed)
[place] MEDICAID FL2 LEVEL OF CARE SCREENING TOOL     IDENTIFICATION  Patient Name: Lawrence West Birthdate: Nov 09, 1945 Sex: male Admission Date (Current Location): 11/05/2016  [place] and [place] Number:  Chiropodist and Address:  [facility], [address] [place], [place] [address]      Provider Number: [id number]  Attending Physician Name and Address:  Altamese Dilling, *  Relative Name and Phone Number:  Osvaldo Angst [phone])    Current Level of Care: Hospital Recommended Level of Care: Skilled Nursing Facility Prior Approval Number:    Date Approved/Denied:   PASRR Number: [id number]  Discharge Plan: SNF    Current Diagnoses: Patient Active Problem List   Diagnosis Date Noted  . Respiratory distress 11/06/2016  . Altered mental status 11/05/2016  . Confusion 06/18/2016  . Acute hepatic encephalopathy 06/18/2016  . Fall at home, initial encounter 06/18/2016  . Hepatic encephalopathy (HCC) 06/18/2016  . GIB (gastrointestinal bleeding) 12/27/2015  . Hyperlipidemia 07/23/2015  . Depression 07/23/2015  . Avascular necrosis of bone of left hip (HCC) 07/08/2015  . S/P total hip arthroplasty 07/08/2015  . Ascites due to alcoholic cirrhosis (HCC) 05/19/2015    Orientation RESPIRATION BLADDER Height & Weight     Self, Time, Situation, Place  O2 (2L at night) Continent Weight: 199 lb 15.3 oz (90.7 kg) Height:   (177.8 cm)  BEHAVIORAL SYMPTOMS/MOOD NEUROLOGICAL BOWEL NUTRITION STATUS      Continent    AMBULATORY STATUS COMMUNICATION OF NEEDS Skin   Extensive Assist Verbally Normal                       Personal Care Assistance Level of Assistance  Bathing, Dressing, Feeding Bathing Assistance: Limited assistance Feeding assistance: Independent Dressing Assistance: Limited assistance     Functional Limitations Info             SPECIAL CARE FACTORS FREQUENCY  PT (By licensed PT)      PT Frequency: Up to 5X per day, 5 days per week              Contractures Contractures Info: Not present    Additional Factors Info  Code Status, Allergies Code Status Info: Full Allergies Info: NKA           Current Medications (11/07/2016):  This is the current hospital active medication list Current Facility-Administered Medications  Medication Dose Route Frequency Provider Last Rate Last Dose  . furosemide (LASIX) tablet 20 mg  20 mg Oral Daily Houston Siren, MD   20 mg at 11/07/16 1008  . HYDROcodone-acetaminophen (NORCO/VICODIN) 5-325 MG per tablet 1 tablet  1 tablet Oral Q6H PRN Altamese Dilling, MD      . Melene Muller ON 11/08/2016] Influenza vac split quadrivalent PF (FLUZONE HIGH-DOSE) injection 0.5 mL  0.5 mL Intramuscular Tomorrow-1000 Merwyn Katos, MD      . ipratropium-albuterol (DUONEB) 0.5-2.5 (3) MG/3ML nebulizer solution 3 mL  3 mL Nebulization Q6H PRN Merwyn Katos, MD   3 mL at 11/07/16 1556  . lactulose (CHRONULAC) 10 GM/15ML solution 20 g  20 g Oral BID Houston Siren, MD   20 g at 11/07/16 1008  . lamoTRIgine (LAMICTAL) tablet 25 mg  25 mg Oral BID Houston Siren, MD   25 mg at 11/07/16 1008  . LORazepam (ATIVAN) injection 0.5-1 mg  0.5-1 mg Intravenous Q4H PRN Merwyn Katos, MD      . magnesium oxide (MAG-OX)  tablet 400 mg  400 mg Oral BID Houston Siren, MD   400 mg at 11/07/16 1008  . meropenem (MERREM) 1 g in sodium chloride 0.9 % 100 mL IVPB  1 g Intravenous Q8H Altamese Dilling, MD   Stopped at 11/07/16 1656  . ondansetron (ZOFRAN) injection 4 mg  4 mg Intravenous Q6H PRN Merwyn Katos, MD      . pantoprazole (PROTONIX) EC tablet 40 mg  40 mg Oral BID Houston Siren, MD   40 mg at 11/07/16 1008  . rifaximin (XIFAXAN) tablet 550 mg  550 mg Oral BID Houston Siren, MD   550 mg at 11/07/16 1008  . spironolactone (ALDACTONE) tablet 25 mg  25 mg Oral Daily Houston Siren, MD   25 mg at 11/07/16 1008  . traMADol (ULTRAM)  tablet 50 mg  50 mg Oral Q6H PRN Houston Siren, MD   50 mg at 11/07/16 1011     Discharge Medications: Please see discharge summary for a list of discharge medications.  Relevant Imaging Results:  Relevant Lab Results:   Additional Information SS# [id number]  Judi Cong, [name]

## 2016-11-07 NOTE — Progress Notes (Signed)
Patient alert and oriented. SCDs on. IV antibiotics finished and saline locked. Soft blood pressures. Pt on 2 liters of O2. Ascites present. Gauze dressing covering incision from paracentesis on the right side of abdomen. Has no complaints at this time. Will continue to monitor

## 2016-11-08 LAB — CULTURE, BLOOD (ROUTINE X 2)
SPECIAL REQUESTS: ADEQUATE
Special Requests: ADEQUATE

## 2016-11-08 LAB — GLUCOSE, CAPILLARY: GLUCOSE-CAPILLARY: 89 mg/dL (ref 65–99)

## 2016-11-08 MED ORDER — DEXTROSE 5 % IV SOLN
Freq: Three times a day (TID) | INTRAVENOUS | Status: DC
  Administered 2016-11-08 – 2016-11-10 (×6): via INTRAVENOUS
  Filled 2016-11-08 (×8): qty 2000

## 2016-11-08 MED ORDER — CEFAZOLIN SODIUM-DEXTROSE 2-4 GM/100ML-% IV SOLN
2.0000 g | Freq: Three times a day (TID) | INTRAVENOUS | Status: DC
  Filled 2016-11-08 (×2): qty 100

## 2016-11-08 NOTE — Consult Note (Signed)
[facility] Infectious Disease     Reason for Consult: Klebsiella PNA bacteremia, Cirrhosis   Referring Physician: Vanetta Shawl, v Date of Admission:  11/05/2016   Active Problems:   Altered mental status   Respiratory distress   HPI: Lawrence West is a 71 y.o. male with cirrhosis admitted with AMS and chills while getting a large volme paracentesis and found to have  Klebsiella PNA bacteremia.   He had high fevers to 103. CXR was negative.   UA neg. Paracentesis showed 93 wbc 22 % PMN. Cx not done on this fluid. Clinically improving, no longer febrile.   Past Medical History:  Diagnosis Date  . Arthritis    "left knee" (07/08/2015)  . Ascites   . Avascular necrosis of bone of left hip ([place]) 07/08/2015  . Brown recluse spider bite 1990s   "they thought I was gonna die then"  . Cirrhosis of liver (HCC)    Sees Dr. Claudie Leach  . Depression   . Edema of both legs    Takes Lasix  . Gout X 1  . High cholesterol   . Hypertension   . Narcolepsy   . Rheumatic fever   . Sleep apnea    pt stated "I have sleep apnea but I do not use the machine...Marland KitchenMarland KitchenMarland Kitchenit broke" (07/08/2015)   Past Surgical History:  Procedure Laterality Date  . COSMETIC SURGERY  ~ 1997   "nose done, lip drop, chin lift"  . ESOPHAGOGASTRODUODENOSCOPY N/A 12/27/2015   Procedure: ESOPHAGOGASTRODUODENOSCOPY (EGD);  Surgeon: Wilford Corner, MD;  Location[facility] ENDOSCOPY;  Service: Endoscopy;  Laterality: N/A;  . FOOT FRACTURE SURGERY Right   . FRACTURE SURGERY    . INGUINAL HERNIA REPAIR Bilateral 1990s  . TONSILLECTOMY  1954  . TOTAL HIP ARTHROPLASTY Left 07/08/2015  . TOTAL HIP ARTHROPLASTY Left 07/08/2015   Procedure: LEFT TOTAL HIP ARTHROPLASTY;  Surgeon: Marchia Bond, MD;  Location: [place];  Service: Orthopedics;  Laterality: Left;  . TOTAL KNEE ARTHROPLASTY Left 03/2008  . [place] [facility] HX)  April 2017   Social History  Substance Use Topics  . Smoking status: Never Smoker  . Smokeless  tobacco: Never Used  . Alcohol use 0.0 oz/week     Comment: 07/08/2015 former Heavy ETOH abuse.; "haven't had a drink in 2017; AA told me I didn't need to come back"   Family History  Problem Relation Age of Onset  . Melanoma Father   . Lung cancer Father     Allergies: No Known Allergies  Current antibiotics: Antibiotics Given (last 72 hours)    Date/Time Action Medication Dose Rate   11/05/16 2110 Given   rifaximin (XIFAXAN) tablet 550 mg 550 mg    11/06/16 0902 Given   rifaximin (XIFAXAN) tablet 550 mg 550 mg    11/06/16 0902 New Bag/Given   ceFEPIme (MAXIPIME) 2 g in dextrose 5 % 50 mL IVPB 2 g 100 mL/hr   11/06/16 1326 New Bag/Given   meropenem (MERREM) 1 g in sodium chloride 0.9 % 100 mL IVPB 1 g 200 mL/hr   11/06/16 2200 Given   rifaximin (XIFAXAN) tablet 550 mg 550 mg    11/06/16 2205 New Bag/Given   meropenem (MERREM) 1 g in sodium chloride 0.9 % 100 mL IVPB 1 g 200 mL/hr   11/07/16 0609 New Bag/Given   meropenem (MERREM) 1 g in sodium chloride 0.9 % 100 mL IVPB 1 g 200 mL/hr   11/07/16 1008 Given   rifaximin (XIFAXAN) tablet 550 mg  550 mg    11/07/16 1528 New Bag/Given   meropenem (MERREM) 1 g in sodium chloride 0.9 % 100 mL IVPB 1 g 200 mL/hr   11/07/16 1934 Given   rifaximin (XIFAXAN) tablet 550 mg 550 mg    11/07/16 2143 New Bag/Given   meropenem (MERREM) 1 g in sodium chloride 0.9 % 100 mL IVPB 1 g 200 mL/hr   11/08/16 0507 New Bag/Given   meropenem (MERREM) 1 g in sodium chloride 0.9 % 100 mL IVPB 1 g 200 mL/hr   11/08/16 0914 Given   rifaximin (XIFAXAN) tablet 550 mg 550 mg    11/08/16 1342 New Bag/Given   ceFAZolin (ANCEF) 2 g in dextrose 5 % 100 mL injection  200 mL/hr      MEDICATIONS: . furosemide  20 mg Oral Daily  . lactulose  20 g Oral BID  . lamoTRIgine  25 mg Oral BID  . magnesium oxide  400 mg Oral BID  . pantoprazole  40 mg Oral BID  . rifaximin  550 mg Oral BID  . spironolactone  25 mg Oral Daily    Review of Systems - 11 systems  reviewed and negative per HPI   OBJECTIVE: Temp:  [97.7 F (36.5 C)-98.5 F (36.9 C)] 98.5 F (36.9 C) (10/01 0725) Pulse Rate:  [68-80] 70 (10/01 0725) Resp:  [16-19] 16 (10/01 0504) BP: (102-123)/(58-72) 114/71 (10/01 0725) SpO2:  [95 %-97 %] 95 % (10/01 0725) Physical Exam  Constitutional: He is oriented to person, place, and time. He appears well-developed and well-nourished. No distress.  HENT: mild icterus Mouth/Throat: Oropharynx is clear and dry . No oropharyngeal exudate.  Cardiovascular: Normal rate, regular rhythm and normal heart sounds.  Pulmonary/Chest: Effort normal and breath sounds normal. No respiratory distress. He has no wheezes.  Abdominal: Soft. Bowel sounds are normal. He exhibits mod distention, non tender Lymphadenopathy: He has no cervical adenopathy.  Neurological: He is alert and oriented to person, place, and time.  Skin: Skin is warm and dry. No rash noted. Some ecchymosis  Psychiatric: He has a normal mood and affect. His behavior is normal.   LABS: Results for orders placed or performed during the hospital encounter of 11/05/16 (from the past 48 hour(s))  Body fluid cell count with differential     Status: None   Collection Time: 11/06/16  2:30 PM  Result Value Ref Range   Fluid Type-FCT CYTOPERI    Color, Fluid YELLOW YELLOW   Appearance, Fluid CLEAR CLEAR   WBC, Fluid 93 cu mm   Neutrophil Count, Fluid 22 %   Lymphs, Fluid 18 %   Monocyte-Macrophage-Serous Fluid 60 %   Eos, Fluid 0 %   Other Cells, Fluid 0 %  Albumin, pleural or peritoneal fluid     Status: None   Collection Time: 11/06/16  2:30 PM  Result Value Ref Range   Albumin, Fluid <1.0 g/dL   Fluid Type-FALB PERITONEAL     Comment: CORRECTED ON 09/29 AT 1458: PREVIOUSLY REPORTED AS CYTOPERI  APTT     Status: Abnormal   Collection Time: 11/07/16  5:43 AM  Result Value Ref Range   aPTT 44 (H) 24 - 36 seconds    Comment:        IF BASELINE aPTT IS ELEVATED, SUGGEST PATIENT RISK  ASSESSMENT BE USED TO DETERMINE APPROPRIATE ANTICOAGULANT THERAPY.   Protime-INR     Status: Abnormal   Collection Time: 11/07/16  5:43 AM  Result Value Ref Range   Prothrombin Time  20.3 (H) 11.4 - 15.2 seconds   INR 1.75   CBC     Status: Abnormal   Collection Time: 11/07/16  5:43 AM  Result Value Ref Range   WBC 11.3 (H) 3.8 - 10.6 K/uL   RBC 3.49 (L) 4.40 - 5.90 MIL/uL   Hemoglobin 11.7 (L) 13.0 - 18.0 g/dL   HCT 34.3 (L) 40.0 - 52.0 %   MCV 98.3 80.0 - 100.0 fL   MCH 33.5 26.0 - 34.0 pg   MCHC 34.1 32.0 - 36.0 g/dL   RDW 16.7 (H) 11.5 - 14.5 %   Platelets 60 (L) 150 - 440 K/uL  Culture, blood (single) w Reflex to ID Panel     Status: None (Preliminary result)   Collection Time: 11/07/16  5:43 AM  Result Value Ref Range   Specimen Description BLOOD BLOOD RIGHT HAND    Special Requests      BOTTLES DRAWN AEROBIC AND ANAEROBIC Blood Culture adequate volume   Culture NO GROWTH 1 DAY    Report Status PENDING   Comprehensive metabolic panel     Status: Abnormal   Collection Time: 11/07/16  5:43 AM  Result Value Ref Range   Sodium 131 (L) 135 - 145 mmol/L   Potassium 3.8 3.5 - 5.1 mmol/L   Chloride 103 101 - 111 mmol/L   CO2 22 22 - 32 mmol/L   Glucose, Bld 120 (H) 65 - 99 mg/dL   BUN 21 (H) 6 - 20 mg/dL   Creatinine, Ser 1.13 0.61 - 1.24 mg/dL   Calcium 7.9 (L) 8.9 - 10.3 mg/dL   Total Protein 5.4 (L) 6.5 - 8.1 g/dL   Albumin 2.5 (L) 3.5 - 5.0 g/dL   AST 66 (H) 15 - 41 U/L   ALT 20 17 - 63 U/L   Alkaline Phosphatase 54 38 - 126 U/L   Total Bilirubin 3.7 (H) 0.3 - 1.2 mg/dL   GFR calc non Af Amer >60 >60 mL/min   GFR calc Af Amer >60 >60 mL/min    Comment: (NOTE) The eGFR has been calculated using the CKD EPI equation. This calculation has not been validated in all clinical situations. eGFR's persistently <60 mL/min signify possible Chronic Kidney Disease.    Anion gap 6 5 - 15   No components found for: ESR, C REACTIVE PROTEIN MICRO: Recent Results (from the  past 720 hour(s))  Blood culture (routine x 2)     Status: Abnormal   Collection Time: 11/05/16  2:30 PM  Result Value Ref Range Status   Specimen Description BLOOD BLOOD LEFT HAND  Final   Special Requests   Final    BOTTLES DRAWN AEROBIC AND ANAEROBIC Blood Culture adequate volume   Culture  Setup Time   Final    GRAM NEGATIVE RODS IN BOTH AEROBIC AND ANAEROBIC BOTTLES CRITICAL RESULT CALLED TO, READ BACK BY AND VERIFIED WITH: Ashutosh BESANTI AT 4431 11/06/16.PMH    Culture KLEBSIELLA PNEUMONIAE (A)  Final   Report Status 11/08/2016 FINAL  Final   Organism ID, Bacteria KLEBSIELLA PNEUMONIAE  Final      Susceptibility   Klebsiella pneumoniae - MIC*    AMPICILLIN >=32 RESISTANT Resistant     CEFAZOLIN <=4 SENSITIVE Sensitive     CEFEPIME <=1 SENSITIVE Sensitive     CEFTAZIDIME <=1 SENSITIVE Sensitive     CEFTRIAXONE <=1 SENSITIVE Sensitive     CIPROFLOXACIN <=0.25 SENSITIVE Sensitive     GENTAMICIN <=1 SENSITIVE Sensitive     IMIPENEM <=0.25  SENSITIVE Sensitive     TRIMETH/SULFA <=20 SENSITIVE Sensitive     AMPICILLIN/SULBACTAM 4 SENSITIVE Sensitive     PIP/TAZO <=4 SENSITIVE Sensitive     Extended ESBL NEGATIVE Sensitive     * KLEBSIELLA PNEUMONIAE  Blood Culture ID Panel (Reflexed)     Status: Abnormal   Collection Time: 11/05/16  2:30 PM  Result Value Ref Range Status   Enterococcus species NOT DETECTED NOT DETECTED Final   Listeria monocytogenes NOT DETECTED NOT DETECTED Final   Staphylococcus species NOT DETECTED NOT DETECTED Final   Staphylococcus aureus NOT DETECTED NOT DETECTED Final   Streptococcus species NOT DETECTED NOT DETECTED Final   Streptococcus agalactiae NOT DETECTED NOT DETECTED Final   Streptococcus pneumoniae NOT DETECTED NOT DETECTED Final   Streptococcus pyogenes NOT DETECTED NOT DETECTED Final   Acinetobacter baumannii NOT DETECTED NOT DETECTED Final   Enterobacteriaceae species DETECTED (A) NOT DETECTED Final    Comment: Enterobacteriaceae represent a  large family of gram-negative bacteria, not a single organism. CRITICAL RESULT CALLED TO, READ BACK BY AND VERIFIED WITH: Jacobey BESANTI AT 4196 11/06/16.PMH    Enterobacter cloacae complex NOT DETECTED NOT DETECTED Final   Escherichia coli NOT DETECTED NOT DETECTED Final   Klebsiella oxytoca NOT DETECTED NOT DETECTED Final   Klebsiella pneumoniae DETECTED (A) NOT DETECTED Final    Comment: CRITICAL RESULT CALLED TO, READ BACK BY AND VERIFIED WITH: Kaylee BESANTI AT 2229 11/06/16.PMH    Proteus species NOT DETECTED NOT DETECTED Final   Serratia marcescens NOT DETECTED NOT DETECTED Final   Carbapenem resistance NOT DETECTED NOT DETECTED Final   Haemophilus influenzae NOT DETECTED NOT DETECTED Final   Neisseria meningitidis NOT DETECTED NOT DETECTED Final   Pseudomonas aeruginosa NOT DETECTED NOT DETECTED Final   Candida albicans NOT DETECTED NOT DETECTED Final   Candida glabrata NOT DETECTED NOT DETECTED Final   Candida krusei NOT DETECTED NOT DETECTED Final   Candida parapsilosis NOT DETECTED NOT DETECTED Final   Candida tropicalis NOT DETECTED NOT DETECTED Final  Blood culture (routine x 2)     Status: Abnormal   Collection Time: 11/05/16  2:42 PM  Result Value Ref Range Status   Specimen Description BLOOD BLOOD LEFT ARM  Final   Special Requests   Final    BOTTLES DRAWN AEROBIC AND ANAEROBIC Blood Culture adequate volume   Culture  Setup Time   Final    GRAM NEGATIVE RODS IN BOTH AEROBIC AND ANAEROBIC BOTTLES CRITICAL RESULT CALLED TO, READ BACK BY AND VERIFIED WITH: Dhyan BESANTI AT 7989 11/06/16.PMH    Culture (A)  Final    KLEBSIELLA PNEUMONIAE SUSCEPTIBILITIES PERFORMED ON PREVIOUS CULTURE WITHIN THE LAST 5 DAYS. Performed at [facility], [place] [address], [place], [place] [address]    Report Status 11/08/2016 FINAL  Final  Culture, blood (single) w Reflex to ID Panel     Status: None (Preliminary result)   Collection Time: 11/07/16  5:43 AM  Result Value Ref Range  Status   Specimen Description BLOOD BLOOD RIGHT HAND  Final   Special Requests   Final    BOTTLES DRAWN AEROBIC AND ANAEROBIC Blood Culture adequate volume   Culture NO GROWTH 1 DAY  Final   Report Status PENDING  Incomplete    IMAGING: Dg Chest 1 View  Result Date: 11/05/2016 CLINICAL DATA:  Shortness of breath EXAM: CHEST 1 VIEW COMPARISON:  06/18/2016, CT chest 05/30/2008, 12/27/2015, 12/31/2015 FINDINGS: No acute consolidation or effusion. Stable cardiomediastinal silhouette ; retrocardiac opacity  similar to 06/18/2016, possibly due to hiatal hernia or tortuous aorta. No pneumothorax. IMPRESSION: No acute infiltrate or edema. Electronically Signed   By: Donavan Foil M.D.   On: 11/05/2016 15:09   Ct Head Wo Contrast  Result Date: 11/05/2016 CLINICAL DATA:  Altered level of consciousness. EXAM: CT HEAD WITHOUT CONTRAST TECHNIQUE: Contiguous axial images were obtained from the base of the skull through the vertex without intravenous contrast. COMPARISON:  CT head 06/06/2016 FINDINGS: Brain: Moderate atrophy. Negative for hydrocephalus. Negative for acute infarct, hemorrhage, or mass lesion. No fluid collection or shift of the midline structures. Vascular: Negative for hyperdense vessel Skull: Negative Sinuses/Orbits: Negative Other: None IMPRESSION: Generalized atrophy.  No acute abnormality. Electronically Signed   By: Franchot Gallo M.D.   On: 11/05/2016 15:48   US Paracentesis  Result Date: 11/06/2016 INDICATION: Patient admitted with tremors yesterday after outpatient paracentesis. Patient's history is for to the ICU secondary to sepsis. Request is made for diagnostic and therapeutic paracentesis with a maximum of 2 L to rule out spontaneous bacterial peritonitis. 25 g of albumin was requested by the attending ICU physician secondary to hypotension. EXAM: ULTRASOUND GUIDED DIAGNOSTIC AND THERAPEUTIC PARACENTESIS MEDICATIONS: 1% xylocaine COMPLICATIONS: None immediate. PROCEDURE: Informed  written consent was obtained from the patient after a discussion of the risks, benefits and alternatives to treatment. A timeout was performed prior to the initiation of the procedure. Initial ultrasound scanning demonstrates a small amount of ascites within the right upper abdominal quadrant. The right upper abdomen was prepped and draped in the usual sterile fashion. 1% xylocaine was used for local anesthesia. Following this, a 19 gauge, 7-cm, Yueh catheter was introduced. An ultrasound image was saved for documentation purposes. The paracentesis was performed. The catheter was removed and a dressing was applied. The patient tolerated the procedure well without immediate post procedural complication. His blood pressure remains around 80/50 the entire procedure. FINDINGS: A total of approximately 2 L of clear, serous fluid was removed. Samples were sent to the laboratory as requested by the clinical team. IMPRESSION: Successful ultrasound-guided paracentesis yielding 2 liters of peritoneal fluid. Samples were sent to the laboratory as requested by the providing clinical team. Read by: Saverio Danker, PA-C Electronically Signed   By: Sandi Mariscal M.D.   On: 11/06/2016 15:01   US Paracentesis  Result Date: 11/05/2016 INDICATION: History of alcoholic cirrhosis with recurrent abdominal ascites. Request is made for therapeutic paracentesis. EXAM: ULTRASOUND GUIDED THERAPEUTIC PARACENTESIS MEDICATIONS: 1% xylocaine COMPLICATIONS: None immediate. PROCEDURE: Informed written consent was obtained from the patient after a discussion of the risks, benefits and alternatives to treatment. A timeout was performed prior to the initiation of the procedure. Initial ultrasound scanning demonstrates a large amount of ascites within the right lower abdominal quadrant. The right lower abdomen was prepped and draped in the usual sterile fashion. 1% xylocaine was used for local anesthesia. Following this, a Safe-T-Centesis catheter was  introduced. An ultrasound image was saved for documentation purposes. The paracentesis was performed. The catheter was removed and a dressing was applied. When the procedure was over the nurse and ultrasound technologist noticed that the patient was shaking. They also felt like he had an altered mentation from prior to his procedure. His blood pressure was normal and his glucose was found to be 130. The patient did state that he had been having a lot of diarrhea from his lactulose. The patient was evaluated and the recommendation was made for him to be evaluated in the emergency department. There  were no immediate complications from the procedure itself. FINDINGS: A total of approximately 7.3 L of serous fluid was removed. IMPRESSION: Successful ultrasound-guided paracentesis yielding 7.3 L liters of peritoneal fluid. Please see above for further details regarding postprocedure disposition. Read by: Saverio Danker, PA-C Electronically Signed   By: Markus Daft M.D.   On: 11/05/2016 14:51   US Paracentesis  Result Date: 10/29/2016 INDICATION: Alcoholic cirrhosis, recurrent ascites. Request made for therapeutic paracentesis. EXAM: ULTRASOUND GUIDED THERAPEUTIC PARACENTESIS MEDICATIONS: None. COMPLICATIONS: None immediate. PROCEDURE: Informed written consent was obtained from the patient after a discussion of the risks, benefits and alternatives to treatment. A timeout was performed prior to the initiation of the procedure. Initial ultrasound scanning demonstrates a large amount of ascites within the right lower abdominal quadrant. The right lower abdomen was prepped and draped in the usual sterile fashion. 1% lidocaine was used for local anesthesia. Following this, a Yueh catheter was introduced. An ultrasound image was saved for documentation purposes. The paracentesis was performed. The catheter was removed and a dressing was applied. The patient tolerated the procedure well without immediate post procedural  complication. FINDINGS: A total of approximately 7.7 liters of yellow fluid was removed. IMPRESSION: Successful ultrasound-guided therapeutic paracentesis yielding 7.7 liters of peritoneal fluid. The patient will receive IV albumin infusion postprocedure. Read by: Rowe Robert, PA-C Electronically Signed   By: Markus Daft M.D.   On: 10/29/2016 14:40   US Paracentesis  Result Date: 10/21/2016 INDICATION: Recurrent symptomatic ascites. EXAM: ULTRASOUND-GUIDED PARACENTESIS COMPARISON:  Multiple previous ultrasound-guided paracenteses, most recently on 10/15/2016 MEDICATIONS: None. COMPLICATIONS: None immediate. TECHNIQUE: Informed written consent was obtained from the patient after a discussion of the risks, benefits and alternatives to treatment. A timeout was performed prior to the initiation of the procedure. Initial ultrasound scanning demonstrates a large amount of ascites within the right lower abdominal quadrant. The right lower abdomen was prepped and draped in the usual sterile fashion. 1% lidocaine with epinephrine was used for local anesthesia. Under direct ultrasound guidance, a 19 gauge, 7-cm, Yueh catheter was introduced. An ultrasound image was saved for documentation purposed. An 8 Fr Safe-T-Centesis catheter was introduced. The paracentesis was performed. The catheter was removed and a dressing was applied. The patient tolerated the procedure well without immediate post procedural complication. FINDINGS: A total of approximately 6.5 liters of serous fluid was removed. IMPRESSION: Successful ultrasound-guided paracentesis yielding 6.5 liters of peritoneal fluid. Electronically Signed   By: Sandi Mariscal M.D.   On: 10/21/2016 13:42   US Paracentesis  Result Date: 10/15/2016 INDICATION: History of cirrhosis, now with recurrent symptomatic ascites. EXAM: ULTRASOUND-GUIDED PARACENTESIS COMPARISON:  Multiple previous ultrasound-guided paracenteses, most recently on 10/08/2016 MEDICATIONS: None.  COMPLICATIONS: None immediate. TECHNIQUE: Informed written consent was obtained from the patient after a discussion of the risks, benefits and alternatives to treatment. A timeout was performed prior to the initiation of the procedure. Initial ultrasound scanning demonstrates a large amount of ascites within the right lower abdominal quadrant. The right lower abdomen was prepped and draped in the usual sterile fashion. 1% lidocaine with epinephrine was used for local anesthesia. Under direct ultrasound guidance, a 19 gauge, 7-cm, Yueh catheter was introduced. An ultrasound image was saved for documentation purposed. The paracentesis was performed. The catheter was removed and a dressing was applied. The patient tolerated the procedure well without immediate post procedural complication. FINDINGS: A total of approximately 7.7 liters of serous fluid was removed. IMPRESSION: Successful ultrasound-guided paracentesis yielding 7.7 liters of peritoneal fluid. Electronically Signed  By: Sandi Mariscal M.D.   On: 10/15/2016 14:36   Dg Chest Port 1 View  Result Date: 11/07/2016 CLINICAL DATA:  Hypoxia EXAM: PORTABLE CHEST 1 VIEW COMPARISON:  11/06/2016 and prior radiographs FINDINGS: Cardiomegaly, interstitial prominence and oval retrocardiac opacity are unchanged. There is no evidence of pleural effusion, pneumothorax or airspace disease. No significant change from prior study. IMPRESSION: Unchanged appearance of the chest with cardiomegaly and stable left retrocardiac opacity. Electronically Signed   By: Margarette Canada M.D.   On: 11/07/2016 08:13   Dg Chest Port 1 View  Result Date: 11/06/2016 CLINICAL DATA:  Hypoxia. EXAM: PORTABLE CHEST 1 VIEW COMPARISON:  11/05/2016.  Chest CT dated 05/30/2008. FINDINGS: Stable enlarged cardiac silhouette with a prominent left ventricular contour. Stable left retrocardiac oval density. Clear lungs with prominent interstitial markings without significant change when a decreased depth  of inspiration is taken into account. Thoracic spine and bilateral shoulder degenerative changes. IMPRESSION: 1. No acute abnormality. 2. Stable cardiomegaly with probable left ventricular hypertrophy. 3. Stable left retrocardiac opacity, possibly representing a hiatal hernia not present on the previous CT. Electronically Signed   By: Claudie Revering M.D.   On: 11/06/2016 12:50    Assessment:   KYZER BLOWE is a 71 y.o. male with cirrhosis who gets weekly paracentesis admitted after a large volume paracentesis with AMS and chills and found to have Klebsiella bacteremia.  Unclear source but likely GI source since UA neg and CXR neg.  Ascitic fluid neg for SBP.  Recommendations Would continue IV ancef while inpatient Then transition to oral keflex 500 mg TID for a 10 day total abx course from admission. No need for ID followup.   Thank you very much for allowing me to participate in the care of this patient. Please call with questions.   Cheral Marker. Ola Spurr, MD

## 2016-11-08 NOTE — Care Management (Signed)
Patient is currently open to [facility].  CM Spoke with agency.  Patient is being followed for end stage cirrhosis.  Informed agency that skilled nursing placement has been recommended.  If pursues skilled nursing, will need to revoke hospice benefit. Updated CSW

## 2016-11-08 NOTE — Evaluation (Signed)
Occupational Therapy Evaluation Patient Details Name: Lawrence West MRN: [id number] DOB: [birth year] Today's Date: 11/08/2016    History of Present Illness Patient is a 71 y.o. male admitted on 28 SEP with worsening shaking/confusion/weakness after having scheduled large volume paracentesis. PMH includes alcoholism, liver cirrhosis, hepatic encephalitis, OA, avascular necrosis of L hip, obstructive sleep apnea, gout, HTN, and HLD.   Clinical Impression   Pt seen for OT evaluation this date. Prior to hospital admission, pt was requiring assist from hospice staff for bathing, med mgt, cleaning, and help from friends for transportation and yard work/cleaining.  Pt lives alone with a dog with friends who can come check on him a couple times per week. Pt endorses 4+ falls in past 12 months (due to tripping, legs "giving out") and pt also endorses not always using cane or walker despite encouragement from friends.  Currently pt demonstrates impaired strength, balance, activity tolerance, safety awareness, and decreased knowledge of AE/DME. Pt noted mild dizziness upon sitting and upon standing. Not orthostatic per BP check with nurse tech during mobility. No overt LOB but unsteadiness noted, dizziness resolving quickly. Pt would benefit from skilled OT to address noted impairments and functional limitations (see below for any additional details) including education/training in AE/DME, self care skills, home/routines modifications, falls prevention, and energy conservation strategies in order to maximize return to PLOF and minimize risk of future falls/injury/rehospitalization/increased caregiver burden.  Upon hospital discharge, recommend pt discharge to STR prior to safe return home.    Follow Up Recommendations  SNF    Equipment Recommendations  3 in 1 bedside commode    Recommendations for Other Services       Precautions / Restrictions Precautions Precautions: Fall Restrictions Weight Bearing  Restrictions: No      Mobility Bed Mobility Overal bed mobility: Needs Assistance Bed Mobility: Supine to Sit;Sit to Supine     Supine to sit: Min guard;HOB elevated Sit to supine: Supervision   General bed mobility comments: min guard with additional effort and use of bed rails to support supine>sit EOB, slight dizziness noted upon sitting BP improved once sitting and dizziness resolved within 1 minute. No shaking noted. No LOB. HR 90, O2 sats 96% on room air throughout session.  Transfers Overall transfer level: Needs assistance Equipment used: None Transfers: Sit to/from Stand Sit to Stand: Min assist;Min guard         General transfer comment: no LOB, slight dizziness noted, resolving quickly, HR and O2 sats WFL    Balance Overall balance assessment: Needs assistance Sitting-balance support: Feet supported Sitting balance-Leahy Scale: Fair     Standing balance support: Single extremity supported Standing balance-Leahy Scale: Poor Standing balance comment: pt holding onto bed rail, OT provided min guard to min assist for static standing balance, verbal cue to weight shift slightly to improve balance with slight forward lean                           ADL either performed or assessed with clinical judgement   ADL Overall ADL's : Needs assistance/impaired Eating/Feeding: Sitting;Set up   Grooming: Sitting;Set up   Upper Body Bathing: Sitting;Set up;Min guard   Lower Body Bathing: Sitting/lateral leans;Minimal assistance;Moderate assistance   Upper Body Dressing : Sitting;Set up;Min guard   Lower Body Dressing: Sitting/lateral leans;Sit to/from stand;Moderate assistance;Minimal assistance   Toilet Transfer: BSC;Min Manufacturing systems engineer Details (indicate cue type and reason): min guard to [facility] just completed with nurse tech before  OT entered room, nurse tech confirmed assist level required         Functional mobility during ADLs: Min  guard;Rolling walker (short distances)       Vision Baseline Vision/History: No visual deficits Patient Visual Report: No change from baseline Vision Assessment?: No apparent visual deficits     Perception     Praxis      Pertinent Vitals/Pain Pain Assessment: 0-10 Pain Score: 2  Pain Location: lower back, neck Pain Descriptors / Indicators: Aching Pain Intervention(s): Limited activity within patient's tolerance;Monitored during session;Premedicated before session;Repositioned     Hand Dominance Right   Extremity/Trunk Assessment Upper Extremity Assessment Upper Extremity Assessment: Generalized weakness (grossly 4/5, weaker distally with fair+ grip strength)   Lower Extremity Assessment Lower Extremity Assessment: Generalized weakness   Cervical / Trunk Assessment Cervical / Trunk Assessment: Normal   Communication Communication Communication: No difficulties   Cognition Arousal/Alertness: Awake/alert Behavior During Therapy: WFL for tasks assessed/performed Overall Cognitive Status: Within Functional Limits for tasks assessed                                 General Comments: follows commands, alert and oriented. occasional verbal cues to redirect conversation. 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   General Comments       Exercises Other Exercises Other Exercises: pt educated in falls prevention strategies to maximize safety given falls history and high risk. Pt/friend in room both verbalized understanding.   Shoulder Instructions      Home Living Family/patient expects to be discharged to:: Private residence Living Arrangements: Alone Available Help at Discharge: Friend(s);Available PRN/intermittently Type of Home: House Home Access: Ramped entrance     Home Layout: Multi-level;Able to live on main level with bedroom/bathroom     Bathroom  Shower/Tub: Tub/shower unit   Bathroom Toilet: Handicapped height     Home Equipment: Environmental consultant - 2 wheels;Walker - 4 wheels;Grab bars - tub/shower;Hand held shower head;Shower seat;Cane - single point   Additional Comments: vertical grab bar outside shower, suction grab bar in shower; recommended pt swap out for installed grab bar to maximize safety      Prior Functioning/Environment Level of Independence: Needs assistance  Gait / Transfers Assistance Needed: Patient modified independent with SPC in household. Also has rollator, 3 wheel walker, and RW. Pt endorses that he sometimes doesn't use AD despite encouragement from friends, which has led to falls before ADL's / Homemaking Assistance Needed: Patient has friend from [place] who drives him to appointments. Does not obtain groceries himself. Pt reports hospice aide has been coming 2x/wk to help with vitals and bathing, hospice nurse comes to assess and set up medications. Friend helps with cleaning and yardwork.   Comments: pt/friend in room endorse at least 4 falls in past 12 months - tripped over rug, legs gave out on him walking across parking lot        OT Problem List: Decreased strength;Cardiopulmonary status limiting activity;Decreased activity tolerance;Decreased safety awareness;Impaired balance (sitting and/or standing)      OT Treatment/Interventions: Self-care/ADL training;Therapeutic exercise;Therapeutic activities;Energy conservation;DME and/or AE instruction;Patient/family education;Balance training    OT Goals(Current goals can be found in the care plan section) Acute Rehab OT Goals Patient Stated Goal: get stronger OT Goal Formulation: With patient Time For Goal Achievement: 11/22/16 Potential to Achieve Goals: Good  OT Frequency: Min  1X/week   Barriers to D/C: Inaccessible home environment;Decreased caregiver support          Co-evaluation              AM-PAC PT "6 Clicks" Daily Activity     Outcome  Measure Help from another person eating meals?: None Help from another person taking care of personal grooming?: None Help from another person toileting, which includes using toliet, bedpan, or urinal?: A Little Help from another person bathing (including washing, rinsing, drying)?: A Lot Help from another person to put on and taking off regular upper body clothing?: A Little Help from another person to put on and taking off regular lower body clothing?: A Lot 6 Click Score: 18   End of Session    Activity Tolerance: Patient tolerated treatment well Patient left: in bed;with call bell/phone within reach;with bed alarm set;with family/visitor present  OT Visit Diagnosis: Other abnormalities of gait and mobility (R26.89);Muscle weakness (generalized) (M62.81);Repeated falls (R29.6)                Time: 1344-1420 OT Time Calculation (min): 36 min Charges:  OT General Charges $OT Visit: 1 Visit OT Evaluation $OT Eval Low Complexity: 1 Low OT Treatments $Self Care/Home Management : 8-22 mins G-Codes: OT G-codes **NOT FOR INPATIENT CLASS** Functional Assessment Tool Used: AM-PAC 6 Clicks Daily Activity;Clinical judgement Functional Limitation: Self care Self Care Current Status (Z6109): At least 40 percent but less than 60 percent impaired, limited or restricted Self Care Goal Status (U0454): At least 20 percent but less than 40 percent impaired, limited or restricted   Richrd Prime, MPH, MS, OTR/L ascom [phone] 11/08/16, 2:42 PM

## 2016-11-08 NOTE — Progress Notes (Signed)
Clinical Child psychotherapist (CSW) contacted [employer] and spoke to [place] and made her aware that patient needs to revoke hospice so he can go to short term rehab at [facility]. Per Berniece the [facility] director Amy stated that patient was discharged from hospice on Friday 11/05/16 and is free to go to rehab. CSW contacted Tracie admissions coordinator at [facility] and made her aware of above. Per Lambert Mody she has started [place] authorization.   [employer], [name] [phone]

## 2016-11-08 NOTE — Progress Notes (Signed)
Physical Therapy Treatment Patient Details Name: Lawrence West MRN: [id number] DOB: 1945-02-28 Today's Date: 11/08/2016    History of Present Illness Patient is a 71 y.o. male admitted on 28 SEP with worsening shaking/confusion/weakness after having scheduled large volume paracentesis. PMH includes alcoholism, liver cirrhosis, hepatic encephalitis, OA, avascular necrosis of L hip, obstructive sleep apnea, gout, HTN, and HLD.    PT Comments    Pt agreeable to PT; reports 2/10 back pain currently. Pt demonstrating Mod I with bed mobility. STS transfers with some effort and several trials before attaining stand. Mildly unsteady on feet initially in static stand with dizziness, which improves. Pt ambulates with mild ataxic/staggering gait and initially very stiff legged until given cues. Pt is able to demonstrate improved swing phase bilaterally, but continues with slight staggering and mild unsteadiness. Encouraged pt to continue working with rolling walker in therapy versus cane that pt was using. Pt participates in seated and long sit exercises well. Continue PT to progress strength, endurance and balance to improve safety with functional mobility.   Follow Up Recommendations  SNF     Equipment Recommendations  None recommended by PT    Recommendations for Other Services       Precautions / Restrictions Precautions Precautions: Fall Restrictions Weight Bearing Restrictions: No    Mobility  Bed Mobility Overal bed mobility: Modified Independent Bed Mobility: Supine to Sit     Supine to sit: Modified independent (Device/Increase time) Sit to supine: Supervision   General bed mobility comments: slower pace  Transfers Overall transfer level: Needs assistance Equipment used: Rolling walker (2 wheeled) Transfers: Sit to/from Stand Sit to Stand: Min guard         General transfer comment: Using rocking motion and several attempts to stand. Cues for safe hand  placement  Ambulation/Gait Ambulation/Gait assistance: Min guard Ambulation Distance (Feet): 120 Feet Assistive device: Rolling walker (2 wheeled) Gait Pattern/deviations: Step-through pattern;Staggering left;Staggering right Gait velocity: reduced Gait velocity interpretation: Below normal speed for age/gender General Gait Details: Initially with B stiff legged gait; improved with cues. Mildly unsteady with unequal step lengths inconsistently. Poor biomechanics of B feet   Stairs            Wheelchair Mobility    Modified Rankin (Stroke Patients Only)       Balance Overall balance assessment: Needs assistance Sitting-balance support: Feet supported Sitting balance-Leahy Scale: Good     Standing balance support: Bilateral upper extremity supported Standing balance-Leahy Scale: Fair Standing balance comment: pt holding onto bed rail, OT provided min guard to min assist for static standing balance, verbal cue to weight shift slightly to improve balance with slight forward lean                            Cognition Arousal/Alertness: Awake/alert Behavior During Therapy: WFL for tasks assessed/performed Overall Cognitive Status: Within Functional Limits for tasks assessed                                 General Comments: follows commands, alert and oriented. occasional verbal cues to redirect conversation. 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      Exercises General Exercises - Lower Extremity Ankle Circles/Pumps: AROM;Both;10 reps Quad Sets: Strengthening;Both;10 reps Gluteal Sets: Strengthening;Both;10 reps Long Arc Quad: AROM;Both;10 reps;Seated Heel  Slides: AROM;Both;10 reps Hip ABduction/ADduction: AROM;Both;10 reps;Seated Hip Flexion/Marching: AROM;Both;10 reps;Seated Other Exercises Other Exercises: pt educated in falls prevention  strategies to maximize safety given falls history and high risk. Pt/friend in room both verbalized understanding.    General Comments        Pertinent Vitals/Pain Pain Assessment: 0-10 Pain Score: 2  Pain Location: low back Pain Descriptors / Indicators: Discomfort Pain Intervention(s): Monitored during session;Repositioned    Home Living Family/patient expects to be discharged to:: Private residence Living Arrangements: Alone Available Help at Discharge: Friend(s);Available PRN/intermittently Type of Home: House Home Access: Ramped entrance   Home Layout: Multi-level;Able to live on main level with bedroom/bathroom Home Equipment: Dan Humphreys - 2 wheels;Walker - 4 wheels;Grab bars - tub/shower;Hand held shower head;Shower seat;Cane - single point Additional Comments: vertical grab bar outside shower, suction grab bar in shower; recommended pt swap out for installed grab bar to maximize safety    Prior Function Level of Independence: Needs assistance  Gait / Transfers Assistance Needed: Patient modified independent with SPC in household. Also has rollator, 3 wheel walker, and RW. Pt endorses that he sometimes doesn't use AD despite encouragement from friends, which has led to falls before ADL's / Homemaking Assistance Needed: Patient has friend from [place] who drives him to appointments. Does not obtain groceries himself. Pt reports hospice aide has been coming 2x/wk to help with vitals and bathing, hospice nurse comes to assess and set up medications. Friend helps with cleaning and yardwork. Comments: pt/friend in room endorse at least 4 falls in past 12 months - tripped over rug, legs gave out on him walking across parking lot   PT Goals (current goals can now be found in the care plan section) Acute Rehab PT Goals Patient Stated Goal: get stronger Progress towards PT goals: Progressing toward goals    Frequency    Min 2X/week      PT Plan Current plan remains appropriate     Co-evaluation              AM-PAC PT "6 Clicks" Daily Activity  Outcome Measure  Difficulty turning over in bed (including adjusting bedclothes, sheets and blankets)?: A Little Difficulty moving from lying on back to sitting on the side of the bed? : A Little Difficulty sitting down on and standing up from a chair with arms (e.g., wheelchair, bedside commode, etc,.)?: A Little Help needed moving to and from a bed to chair (including a wheelchair)?: A Little Help needed walking in hospital room?: A Little Help needed climbing 3-5 steps with a railing? : A Lot 6 Click Score: 17    End of Session Equipment Utilized During Treatment: Gait belt Activity Tolerance: Patient tolerated treatment well Patient left: in chair;with call bell/phone within reach;with chair alarm set   PT Visit Diagnosis: Unsteadiness on feet (R26.81);Pain;Difficulty in walking, not elsewhere classified (R26.2);Muscle weakness (generalized) (M62.81)     Time: 1610-9604 PT Time Calculation (min) (ACUTE ONLY): 30 min  Charges:  $Gait Training: 8-22 mins $Therapeutic Exercise: 8-22 mins                    G Codes:        Scot Dock, PTA 11/08/2016, 4:18 PM

## 2016-11-08 NOTE — Clinical Social Work Placement (Signed)
   CLINICAL SOCIAL WORK PLACEMENT  NOTE  Date:  11/08/2016  Patient Details  Name: Lawrence West MRN: [id number] Date of Birth: 1945/08/22  Clinical Social Work is seeking post-discharge placement for this patient at the Skilled  Nursing Facility level of care (*CSW will initial, date and re-position this form in  chart as items are completed):  Yes   Patient/family provided with [facility] Clinical Social Work Department's list of facilities offering this level of care within the geographic area requested by the patient (or if unable, by the patient's family).  Yes   Patient/family informed of their freedom to choose among providers that offer the needed level of care, that participate in Medicare, Medicaid or managed care program needed by the patient, have an available bed and are willing to accept the patient.  Yes   Patient/family informed of [facility]'s ownership interest in [facility] and [facility], as well as of the fact that they are under no obligation to receive care at these facilities.  PASRR submitted to EDS on       PASRR number received on       Existing PASRR number confirmed on 11/07/16     FL2 transmitted to all facilities in geographic area requested by pt/family on 11/07/16     FL2 transmitted to all facilities within larger geographic area on       Patient informed that his/her managed care company has contracts with or will negotiate with certain facilities, including the following:        Yes   Patient/family informed of bed offers received.  Patient chooses bed at  [facility] )     Physician recommends and patient chooses bed at      Patient to be transferred to   on  .  Patient to be transferred to facility by       Patient family notified on   of transfer.  Name of family member notified:        PHYSICIAN       Additional Comment:    _______________________________________________ Fara Worthy, Darleen Crocker, [name] 11/08/2016, 10:02  AM

## 2016-11-08 NOTE — Progress Notes (Signed)
Clinical Education officer, museum (CSW) met with patient and presented bed offers. Patient chose [facility]. CSW explained to patient that Mcarthur Rossetti will have to approve SNF. Patient verbalized his understanding and stated that he has been to [facility] before. Per Tracie admissions coordinator at [facility] she will start [facility] authorization.   [employer], [name] [phone]

## 2016-11-08 NOTE — Progress Notes (Signed)
[facility] [place] at [facility]   PATIENT NAME: Lawrence West    MR#:  [id number]  DATE OF BIRTH:  05-19-1945  SUBJECTIVE:  CHIEF COMPLAINT:   Chief Complaint  Patient presents with  . Weakness     Liver cirrhosis and recurrent ascites and paracentesis every week. Had done yesterday with 7.3 ltr fluid removed. Now came with chills , shaking.  Noted to be septic and have bacteremia.   Improved after 2 more ltr fluid removed by IR.   No complains, except- abdomen still distended.  REVIEW OF SYSTEMS:   CONSTITUTIONAL: No fever, fatigue or weakness.  EYES: No blurred or double vision.  EARS, NOSE, AND THROAT: No tinnitus or ear pain.  RESPIRATORY: No cough, shortness of breath, wheezing or hemoptysis.  CARDIOVASCULAR: No chest pain, orthopnea, edema.  GASTROINTESTINAL: No nausea, vomiting, diarrhea, some abdominal pain.  GENITOURINARY: No dysuria, hematuria.  ENDOCRINE: No polyuria, nocturia,  HEMATOLOGY: No anemia, easy bruising or bleeding SKIN: No rash or lesion. MUSCULOSKELETAL: No joint pain or arthritis.   NEUROLOGIC: No tingling, numbness, weakness.  PSYCHIATRY: No anxiety or depression.   ROS  DRUG ALLERGIES:  No Known Allergies  VITALS:  Blood pressure (!) 101/54, pulse 85, temperature 98.2 F (36.8 C), temperature source Oral, resp. rate 18, height  (1.778 m), weight 90.7 kg (199 lb 15.3 oz), SpO2 98 %.  PHYSICAL EXAMINATION:  GENERAL:  71 y.o.-year-old patient lying in the bed with no acute distress earlier,  EYES: Pupils equal, round, reactive to light and accommodation. No scleral icterus. Extraocular muscles intact.  HEENT: Head atraumatic, normocephalic. Oropharynx and nasopharynx clear.  NECK:  Supple, no jugular venous distention. No thyroid enlargement, no tenderness.  LUNGS: Normal breath sounds bilaterally, no wheezing, some crepitation. No use of accessory muscles of respiration .  CARDIOVASCULAR: S1, S2 normal. No murmurs,  rubs, or gallops.  ABDOMEN: Soft, nontender, distended. Bowel sounds present. No organomegaly or mass.  EXTREMITIES: No pedal edema, cyanosis, or clubbing.  NEUROLOGIC: Cranial nerves II through XII are intact. Muscle strength 3-5/5 in all extremities. Sensation intact. Gait not checked. Mild shaking. PSYCHIATRIC: The patient is alert and oriented x 3.  SKIN: No obvious rash, lesion, or ulcer.   Physical Exam LABORATORY PANEL:   CBC  Recent Labs Lab 11/07/16 0543  WBC 11.3*  HGB 11.7*  HCT 34.3*  PLT 60*   ------------------------------------------------------------------------------------------------------------------  Chemistries   Recent Labs Lab 11/07/16 0543  NA 131*  K 3.8  CL 103  CO2 22  GLUCOSE 120*  BUN 21*  CREATININE 1.13  CALCIUM 7.9*  AST 66*  ALT 20  ALKPHOS 54  BILITOT 3.7*   ------------------------------------------------------------------------------------------------------------------  Cardiac Enzymes  Recent Labs Lab 11/05/16 1442  TROPONINI <0.03   ------------------------------------------------------------------------------------------------------------------  RADIOLOGY:  US Paracentesis  Result Date: 11/06/2016 INDICATION: Patient admitted with tremors yesterday after outpatient paracentesis. Patient's history is for to the ICU secondary to sepsis. Request is made for diagnostic and therapeutic paracentesis with a maximum of 2 L to rule out spontaneous bacterial peritonitis. 25 g of albumin was requested by the attending ICU physician secondary to hypotension. EXAM: ULTRASOUND GUIDED DIAGNOSTIC AND THERAPEUTIC PARACENTESIS MEDICATIONS: 1% xylocaine COMPLICATIONS: None immediate. PROCEDURE: Informed written consent was obtained from the patient after a discussion of the risks, benefits and alternatives to treatment. A timeout was performed prior to the initiation of the procedure. Initial ultrasound scanning demonstrates a small amount of  ascites within the right upper abdominal quadrant. The right upper  abdomen was prepped and draped in the usual sterile fashion. 1% xylocaine was used for local anesthesia. Following this, a 19 gauge, 7-cm, Yueh catheter was introduced. An ultrasound image was saved for documentation purposes. The paracentesis was performed. The catheter was removed and a dressing was applied. The patient tolerated the procedure well without immediate post procedural complication. His blood pressure remains around 80/50 the entire procedure. FINDINGS: A total of approximately 2 L of clear, serous fluid was removed. Samples were sent to the laboratory as requested by the clinical team. IMPRESSION: Successful ultrasound-guided paracentesis yielding 2 liters of peritoneal fluid. Samples were sent to the laboratory as requested by the providing clinical team. Read by: Barnetta Chapel, PA-C Electronically Signed   By: Simonne Come M.D.   On: 11/06/2016 15:01   Dg Chest Port 1 View  Result Date: 11/07/2016 CLINICAL DATA:  Hypoxia EXAM: PORTABLE CHEST 1 VIEW COMPARISON:  11/06/2016 and prior radiographs FINDINGS: Cardiomegaly, interstitial prominence and oval retrocardiac opacity are unchanged. There is no evidence of pleural effusion, pneumothorax or airspace disease. No significant change from prior study. IMPRESSION: Unchanged appearance of the chest with cardiomegaly and stable left retrocardiac opacity. Electronically Signed   By: Harmon Pier M.D.   On: 11/07/2016 08:13    ASSESSMENT AND PLAN:   Active Problems:   Altered mental status   Respiratory distress  71 year old male with past medical history of alcoholic liver cirrhosis, hypertension, depression, GERD, obstructive sleep apnea, previous history of hepatic encephalopathy presents to the hospital due to tremors, altered mental status after paracentesis.  1. Sepsis, due to SBP, bacteremia   Ac hypoxic respi failure.  Altered mental status/weakness/tremor-  UA and  Xray chest negative.  meropenem.  Spoke to Intensivist and [employer], 2 ltr Fluid removed 11/06/16- no high WBCs.  have final result on bl cx- klebsiella- change to Ancef, ID consult.  2. History of hepatic encephalopathy-ammonia level close to baseline. Continue lactulose, Xifaxan.  3. Depression-continue Lexapro.  4. Hyperlipidemia-continue atorvastatin.  5. GERD-continue Protonix.  6. Hx of Alcoholic Liver Cirrhosis - cont. Aldactone, Lasix, Lactulose, Xifaxan.      He drinks a lot of liquids and ice at home, councelled about fluid restrictions.  7. Thrombocytopenia    Due to liver disease.  8. Hypoalbuminemia   Due to liver disease, monitor.  Pt wants to go to rehab.  All the records are reviewed and case discussed with Care Management/Social Workerr. Management plans discussed with the patient, family and they are in agreement.  CODE STATUS: Full.  TOTAL TIME TAKING CARE OF THIS PATIENT: 35 minutes.   POSSIBLE D/C IN 1-2 DAYS, DEPENDING ON CLINICAL CONDITION.   Altamese Dilling M.D on 11/08/2016   Between 7am to 6pm - Pager - [phone]  After 6pm go to [email or web address] - password [email or web address]  [facility] [facility]  Office  [phone]  CC: Primary care physician; Kandyce Rud, MD  Note: This dictation was prepared with Dragon dictation along with smaller phrase technology. Any transcriptional errors that result from this process are unintentional.

## 2016-11-09 ENCOUNTER — Inpatient Hospital Stay: Payer: Medicare Other

## 2016-11-09 LAB — COMPREHENSIVE METABOLIC PANEL
ALT: 22 U/L (ref 17–63)
AST: 61 U/L — ABNORMAL HIGH (ref 15–41)
Albumin: 2.3 g/dL — ABNORMAL LOW (ref 3.5–5.0)
Alkaline Phosphatase: 77 U/L (ref 38–126)
Anion gap: 3 — ABNORMAL LOW (ref 5–15)
BUN: 20 mg/dL (ref 6–20)
CHLORIDE: 107 mmol/L (ref 101–111)
CO2: 26 mmol/L (ref 22–32)
CREATININE: 1.06 mg/dL (ref 0.61–1.24)
Calcium: 8.3 mg/dL — ABNORMAL LOW (ref 8.9–10.3)
Glucose, Bld: 122 mg/dL — ABNORMAL HIGH (ref 65–99)
POTASSIUM: 4.1 mmol/L (ref 3.5–5.1)
Sodium: 136 mmol/L (ref 135–145)
Total Bilirubin: 2.2 mg/dL — ABNORMAL HIGH (ref 0.3–1.2)
Total Protein: 5.1 g/dL — ABNORMAL LOW (ref 6.5–8.1)

## 2016-11-09 LAB — CBC
HEMATOCRIT: 34.5 % — AB (ref 40.0–52.0)
Hemoglobin: 12 g/dL — ABNORMAL LOW (ref 13.0–18.0)
MCH: 35.1 pg — ABNORMAL HIGH (ref 26.0–34.0)
MCHC: 34.7 g/dL (ref 32.0–36.0)
MCV: 101.2 fL — AB (ref 80.0–100.0)
PLATELETS: 71 10*3/uL — AB (ref 150–440)
RBC: 3.41 MIL/uL — AB (ref 4.40–5.90)
RDW: 16.7 % — AB (ref 11.5–14.5)
WBC: 4.9 10*3/uL (ref 3.8–10.6)

## 2016-11-09 LAB — CYTOLOGY - NON PAP

## 2016-11-09 MED ORDER — PHENOL 1.4 % MT LIQD
1.0000 | OROMUCOSAL | Status: DC | PRN
Start: 2016-11-09 — End: 2016-11-10
  Administered 2016-11-09: 1 via OROMUCOSAL
  Filled 2016-11-09: qty 177

## 2016-11-09 MED ORDER — ESCITALOPRAM OXALATE 10 MG PO TABS
20.0000 mg | ORAL_TABLET | Freq: Every day | ORAL | Status: DC
  Administered 2016-11-09 – 2016-11-10 (×2): 20 mg via ORAL
  Filled 2016-11-09: qty 2
  Filled 2016-11-09: qty 1

## 2016-11-09 MED ORDER — ALBUMIN HUMAN 5 % IV SOLN
25.0000 g | Freq: Once | INTRAVENOUS | Status: AC
  Administered 2016-11-09: 25 g via INTRAVENOUS
  Filled 2016-11-09 (×3): qty 500

## 2016-11-09 NOTE — Progress Notes (Signed)
Occupational Therapy Treatment Patient Details Name: Lawrence West MRN: [id number] DOB: 02/11/1945 Today's Date: 11/09/2016    History of present illness Patient is a 71 y.o. male admitted on 28 SEP with worsening shaking/confusion/weakness after having scheduled large volume paracentesis. PMH includes alcoholism, liver cirrhosis, hepatic encephalitis, OA, avascular necrosis of L hip, obstructive sleep apnea, gout, HTN, and HLD.   OT comments  Pt seen for OT treatment this date, limited somewhat by fatigue after paracentesis this am and PT as well. Pt agreeable to bed level session focused on education/training in energy conservation strategies and AE/DME for LB ADL to maximize safety and functional independence. Pt would benefit from additional OT services to support recall and carryover of learned techniques and trial use of sock aid and reacher for LB dressing next date.   Follow Up Recommendations  SNF    Equipment Recommendations  3 in 1 bedside commode;Other (comment) (grab bar in shower, sock aid)    Recommendations for Other Services      Precautions / Restrictions Precautions Precautions: Fall Restrictions Weight Bearing Restrictions: No       Mobility Bed Mobility               General bed mobility comments: deferred, bed level session held due to pt's fatigue from procedure this am and PT.  Transfers                 General transfer comment: deferred, bed level session held due to pt's fatigue from procedure this am and PT.    Balance                                           ADL either performed or assessed with clinical judgement   ADL Overall ADL's : Needs assistance/impaired               Lower Body Bathing Details (indicate cue type and reason): pt educated in seated showering to maximize safety using existing shower chair with adjusting height, using his HH showerhead, LH sponge.        Lower Body Dressing Details  (indicate cue type and reason): pt educated in use of reacher for LB dressing and strategies to don/doff socks/shoes with pt's reacher, LH shoe horn, and sock aid. Would benefit from training to trial use of items, esp sock aid. Pt did verbalize understanding, too fatigued from paracentesis earlier this date to trial himself.                     Vision Patient Visual Report: No change from baseline     Perception     Praxis      Cognition Arousal/Alertness: Awake/alert Behavior During Therapy: WFL for tasks assessed/performed Overall Cognitive Status: Within Functional Limits for tasks assessed                                 General Comments: verbal cues to redirect, as pt is very talkative, but easily redirected         Exercises Other Exercises Other Exercises: pt educated in energy conservation strategies with handout provided to support functional independence and safety, pt verbalized understanding, would benefit from additional education/training to maximize recall and carry over.    Shoulder Instructions       General Comments  Pertinent Vitals/ Pain       Pain Assessment: No/denies pain  Home Living                                          Prior Functioning/Environment              Frequency  Min 1X/week        Progress Toward Goals  OT Goals(current goals can now be found in the care plan section)  Progress towards OT goals: Progressing toward goals  Acute Rehab OT Goals Patient Stated Goal: get stronger  Plan Discharge plan remains appropriate;Frequency remains appropriate    Co-evaluation                 AM-PAC PT "6 Clicks" Daily Activity     Outcome Measure   Help from another person eating meals?: None Help from another person taking care of personal grooming?: None Help from another person toileting, which includes using toliet, bedpan, or urinal?: A Little Help from another person  bathing (including washing, rinsing, drying)?: A Lot Help from another person to put on and taking off regular upper body clothing?: A Little Help from another person to put on and taking off regular lower body clothing?: A Lot 6 Click Score: 18    End of Session    OT Visit Diagnosis: Other abnormalities of gait and mobility (R26.89);Muscle weakness (generalized) (M62.81);Repeated falls (R29.6)   Activity Tolerance Patient tolerated treatment well;Patient limited by fatigue   Patient Left in bed;with call bell/phone within reach;with bed alarm set;Other (comment) (with MD in room at end of session)   Nurse Communication          Time: [id number] OT Time Calculation (min): 28 min  Charges: OT Treatments $Self Care/Home Management : 23-37 mins  Richrd Prime, MPH, MS, OTR/L ascom [phone] 11/09/16, 2:54 PM

## 2016-11-09 NOTE — Progress Notes (Signed)
[facility] CLINIC INFECTIOUS DISEASE PROGRESS NOTE Date of Admission:  11/05/2016     ID: Lawrence West is a 71 y.o. male with Cirrhosis and Kleb bacteremia Active Problems:   Altered mental status   Respiratory distress   Subjective: No fevers, had repeat para today   ROS  Eleven systems are reviewed and negative except per hpi  Medications:  Antibiotics Given (last 72 hours)    Date/Time Action Medication Dose Rate   11/06/16 2200 Given   rifaximin (XIFAXAN) tablet 550 mg 550 mg    11/06/16 2205 New Bag/Given   meropenem (MERREM) 1 g in sodium chloride 0.9 % 100 mL IVPB 1 g 200 mL/hr   11/07/16 0609 New Bag/Given   meropenem (MERREM) 1 g in sodium chloride 0.9 % 100 mL IVPB 1 g 200 mL/hr   11/07/16 1008 Given   rifaximin (XIFAXAN) tablet 550 mg 550 mg    11/07/16 1528 New Bag/Given   meropenem (MERREM) 1 g in sodium chloride 0.9 % 100 mL IVPB 1 g 200 mL/hr   11/07/16 1934 Given   rifaximin (XIFAXAN) tablet 550 mg 550 mg    11/07/16 2143 New Bag/Given   meropenem (MERREM) 1 g in sodium chloride 0.9 % 100 mL IVPB 1 g 200 mL/hr   11/08/16 0507 New Bag/Given   meropenem (MERREM) 1 g in sodium chloride 0.9 % 100 mL IVPB 1 g 200 mL/hr   11/08/16 0914 Given   rifaximin (XIFAXAN) tablet 550 mg 550 mg    11/08/16 1342 New Bag/Given   ceFAZolin (ANCEF) 2 g in dextrose 5 % 100 mL injection  200 mL/hr   11/08/16 2104 Given   rifaximin (XIFAXAN) tablet 550 mg 550 mg    11/08/16 2104 New Bag/Given   ceFAZolin (ANCEF) 2 g in dextrose 5 % 100 mL injection  200 mL/hr   11/09/16 0528 New Bag/Given   ceFAZolin (ANCEF) 2 g in dextrose 5 % 100 mL injection  200 mL/hr   11/09/16 0855 Given   rifaximin (XIFAXAN) tablet 550 mg 550 mg    11/09/16 1332 New Bag/Given   ceFAZolin (ANCEF) 2 g in dextrose 5 % 100 mL injection  200 mL/hr     . escitalopram  20 mg Oral Daily  . furosemide  20 mg Oral Daily  . lactulose  20 g Oral BID  . lamoTRIgine  25 mg Oral BID  . magnesium oxide  400 mg  Oral BID  . pantoprazole  40 mg Oral BID  . rifaximin  550 mg Oral BID  . spironolactone  25 mg Oral Daily    Objective: Vital signs in last 24 hours: Temp:  [98 F (36.7 C)-98.1 F (36.7 C)] 98 F (36.7 C) (10/02 0711) Pulse Rate:  [74-86] 78 (10/02 1105) Resp:  [18] 18 (10/01 1935) BP: (102-134)/(64-82) 134/82 (10/02 1105) SpO2:  [94 %-98 %] 96 % (10/02 1105) Constitutional: He is oriented to person, place, and time. He appears well-developed and well-nourished. No distress.  HENT: mild icterus Mouth/Throat: Oropharynx is clear and dry . No oropharyngeal exudate.  Cardiovascular: Normal rate, regular rhythm and normal heart sounds.  Pulmonary/Chest: Effort normal and breath sounds normal. No respiratory distress. He has no wheezes.  Abdominal: Soft. Bowel sounds are normal. He exhibits mod distention, non tender Lymphadenopathy: He has no cervical adenopathy.  Neurological: He is alert and oriented to person, place, and time.  Skin: Skin is warm and dry. No rash noted. Some ecchymosis  Psychiatric: He has a  normal mood and affect. His behavior is normal.   Lab Results  Recent Labs  11/07/16 0543 11/09/16 0419  WBC 11.3* 4.9  HGB 11.7* 12.0*  HCT 34.3* 34.5*  NA 131* 136  K 3.8 4.1  CL 103 107  CO2 22 26  BUN 21* 20  CREATININE 1.13 1.06    Microbiology: Results for orders placed or performed during the hospital encounter of 11/05/16  Blood culture (routine x 2)     Status: Abnormal   Collection Time: 11/05/16  2:30 PM  Result Value Ref Range Status   Specimen Description BLOOD BLOOD LEFT HAND  Final   Special Requests   Final    BOTTLES DRAWN AEROBIC AND ANAEROBIC Blood Culture adequate volume   Culture  Setup Time   Final    GRAM NEGATIVE RODS IN BOTH AEROBIC AND ANAEROBIC BOTTLES CRITICAL RESULT CALLED TO, READ BACK BY AND VERIFIED WITH: Apollo BESANTI AT 6045 11/06/16.PMH    Culture KLEBSIELLA PNEUMONIAE (A)  Final   Report Status 11/08/2016 FINAL  Final    Organism ID, Bacteria KLEBSIELLA PNEUMONIAE  Final      Susceptibility   Klebsiella pneumoniae - MIC*    AMPICILLIN >=32 RESISTANT Resistant     CEFAZOLIN <=4 SENSITIVE Sensitive     CEFEPIME <=1 SENSITIVE Sensitive     CEFTAZIDIME <=1 SENSITIVE Sensitive     CEFTRIAXONE <=1 SENSITIVE Sensitive     CIPROFLOXACIN <=0.25 SENSITIVE Sensitive     GENTAMICIN <=1 SENSITIVE Sensitive     IMIPENEM <=0.25 SENSITIVE Sensitive     TRIMETH/SULFA <=20 SENSITIVE Sensitive     AMPICILLIN/SULBACTAM 4 SENSITIVE Sensitive     PIP/TAZO <=4 SENSITIVE Sensitive     Extended ESBL NEGATIVE Sensitive     * KLEBSIELLA PNEUMONIAE  Blood Culture ID Panel (Reflexed)     Status: Abnormal   Collection Time: 11/05/16  2:30 PM  Result Value Ref Range Status   Enterococcus species NOT DETECTED NOT DETECTED Final   Listeria monocytogenes NOT DETECTED NOT DETECTED Final   Staphylococcus species NOT DETECTED NOT DETECTED Final   Staphylococcus aureus NOT DETECTED NOT DETECTED Final   Streptococcus species NOT DETECTED NOT DETECTED Final   Streptococcus agalactiae NOT DETECTED NOT DETECTED Final   Streptococcus pneumoniae NOT DETECTED NOT DETECTED Final   Streptococcus pyogenes NOT DETECTED NOT DETECTED Final   Acinetobacter baumannii NOT DETECTED NOT DETECTED Final   Enterobacteriaceae species DETECTED (A) NOT DETECTED Final    Comment: Enterobacteriaceae represent a large family of gram-negative bacteria, not a single organism. CRITICAL RESULT CALLED TO, READ BACK BY AND VERIFIED WITH: Taygen BESANTI AT 4098 11/06/16.PMH    Enterobacter cloacae complex NOT DETECTED NOT DETECTED Final   Escherichia coli NOT DETECTED NOT DETECTED Final   Klebsiella oxytoca NOT DETECTED NOT DETECTED Final   Klebsiella pneumoniae DETECTED (A) NOT DETECTED Final    Comment: CRITICAL RESULT CALLED TO, READ BACK BY AND VERIFIED WITH: Keilyn BESANTI AT 1191 11/06/16.PMH    Proteus species NOT DETECTED NOT DETECTED Final   Serratia  marcescens NOT DETECTED NOT DETECTED Final   Carbapenem resistance NOT DETECTED NOT DETECTED Final   Haemophilus influenzae NOT DETECTED NOT DETECTED Final   Neisseria meningitidis NOT DETECTED NOT DETECTED Final   Pseudomonas aeruginosa NOT DETECTED NOT DETECTED Final   Candida albicans NOT DETECTED NOT DETECTED Final   Candida glabrata NOT DETECTED NOT DETECTED Final   Candida krusei NOT DETECTED NOT DETECTED Final   Candida parapsilosis NOT DETECTED NOT DETECTED Final  Candida tropicalis NOT DETECTED NOT DETECTED Final  Blood culture (routine x 2)     Status: Abnormal   Collection Time: 11/05/16  2:42 PM  Result Value Ref Range Status   Specimen Description BLOOD BLOOD LEFT ARM  Final   Special Requests   Final    BOTTLES DRAWN AEROBIC AND ANAEROBIC Blood Culture adequate volume   Culture  Setup Time   Final    GRAM NEGATIVE RODS IN BOTH AEROBIC AND ANAEROBIC BOTTLES CRITICAL RESULT CALLED TO, READ BACK BY AND VERIFIED WITH: Waylin BESANTI AT 1610 11/06/16.PMH    Culture (A)  Final    KLEBSIELLA PNEUMONIAE SUSCEPTIBILITIES PERFORMED ON PREVIOUS CULTURE WITHIN THE LAST 5 DAYS. Performed at [facility], [address], [place], [place] [address]    Report Status 11/08/2016 FINAL  Final  Culture, blood (single) w Reflex to ID Panel     Status: None (Preliminary result)   Collection Time: 11/07/16  5:43 AM  Result Value Ref Range Status   Specimen Description BLOOD BLOOD RIGHT HAND  Final   Special Requests   Final    BOTTLES DRAWN AEROBIC AND ANAEROBIC Blood Culture adequate volume   Culture NO GROWTH 2 DAYS  Final   Report Status PENDING  Incomplete    Studies/Results: US Paracentesis  Result Date: 11/09/2016 INDICATION: History of alcoholic cirrhosis with recurrent abdominal ascites. Request is made for therapeutic paracentesis. EXAM: ULTRASOUND GUIDED THERAPEUTIC PARACENTESIS MEDICATIONS: 1% lidocaine COMPLICATIONS: None immediate. PROCEDURE: Informed written consent  was obtained from the patient after a discussion of the risks, benefits and alternatives to treatment. A timeout was performed prior to the initiation of the procedure. Initial ultrasound scanning demonstrates a large amount of ascites within the right upper abdominal quadrant. The right upper abdomen was prepped and draped in the usual sterile fashion. 1% lidocaine was used for local anesthesia. Following this, a 19 gauge, 7-cm, Yueh catheter was introduced. An ultrasound image was saved for documentation purposes. The paracentesis was performed. The catheter was removed and a dressing was applied. The patient tolerated the procedure well without immediate post procedural complication. FINDINGS: A total of approximately 6.2 L of serous fluid was removed. IMPRESSION: Successful ultrasound-guided paracentesis yielding 6.2 liters of peritoneal fluid. Read by: Barnetta Chapel, PA-C Electronically Signed   By: Jolaine Click M.D.   On: 11/09/2016 11:08    Assessment/Plan: Lawrence West is a 71 y.o. male with cirrhosis who gets weekly paracentesis admitted after a large volume paracentesis with AMS and chills and found to have Klebsiella bacteremia.  Unclear source but likely GI source since UA neg and CXR neg.  Ascitic fluid neg for SBP.  Recommendations Would continue IV ancef while inpatient Then transition to oral keflex 500 mg TID for a 10 day total abx course from admission. No need for ID followup.   Thank you very much for the consult. Will follow with you.  Lawrence West   11/09/2016, 2:35 PM

## 2016-11-09 NOTE — Progress Notes (Addendum)
Physical Therapy Treatment Patient Details Name: Lawrence West MRN: [id number] DOB: 08-Sep-1945 Today's Date: 11/09/2016    History of Present Illness Patient is a 71 y.o. male admitted on 28 SEP with worsening shaking/confusion/weakness after having scheduled large volume paracentesis. PMH includes alcoholism, liver cirrhosis, hepatic encephalitis, OA, avascular necrosis of L hip, obstructive sleep apnea, gout, HTN, and HLD.    PT Comments    Pt agreeable to PT; reports neck and back pain 7/10. Pt notes difficulty with diarrhea today, but agreeable to exercises in the room. Pt participates in supine exercises and notes doing them throughout the day independently; pt requires some cues to correct for proper technique. Pt requires heavy support through upper extremities with stand exercises. Fatigues quickly. Continue PT to progress strength, endurance and balance to improve functional mobility and decrease fall risk.    Follow Up Recommendations  SNF     Equipment Recommendations  Rolling walker with 5" wheels (has one at home. also has rollator, but needs to use RW)    Recommendations for Other Services       Precautions / Restrictions Precautions Precautions: Fall Restrictions Weight Bearing Restrictions: No    Mobility  Bed Mobility Overal bed mobility: Modified Independent       Supine to sit: Modified independent (Device/Increase time) Sit to supine: Modified independent (Device/Increase time)   General bed mobility comments: Increased time today compared to last session. Use of rails  Transfers Overall transfer level: Needs assistance Equipment used: Rolling walker (2 wheeled) Transfers: Sit to/from Stand Sit to Stand: Min guard         General transfer comment: Wide BOS, slow to rise, unsteady  Ambulation/Gait                 Stairs            Wheelchair Mobility    Modified Rankin (Stroke Patients Only)       Balance                                             Cognition Arousal/Alertness: Awake/alert Behavior During Therapy: WFL for tasks assessed/performed Overall Cognitive Status: Within Functional Limits for tasks assessed                                 General Comments: verbal cues to redirect, as pt is very talkative, but easily redirected       Exercises General Exercises - Lower Extremity Ankle Circles/Pumps: AROM;Both;20 reps;Supine Quad Sets: Strengthening;Both;20 reps;Supine Gluteal Sets: Strengthening;Both;20 reps Long Arc Quad: AROM;Strengthening Heel Slides: AROM;Both;20 reps;Supine Hip ABduction/ADduction: Strengthening;Both;10 reps;Standing Straight Leg Raises: Strengthening;Both;10 reps;Supine Hip Flexion/Marching: Strengthening;Both;20 reps;Seated (core stabilization) Toe Raises: AROM;Both;10 reps;Seated Heel Raises: AROM;Both;10 reps;Seated Other Exercises Other Exercises: pt educated in energy conservation strategies with handout provided to support functional independence and safety, pt verbalized understanding, would benefit from additional education/training to maximize recall and carry over.     General Comments        Pertinent Vitals/Pain Pain Assessment: 0-10 Pain Score: 7  Pain Location: cervical spine and back  Pain Descriptors / Indicators: Aching;Constant Pain Intervention(s): Monitored during session    Home Living                      Prior Function  PT Goals (current goals can now be found in the care plan section) Acute Rehab PT Goals Patient Stated Goal: get stronger Progress towards PT goals: Progressing toward goals    Frequency    Min 2X/week      PT Plan Current plan remains appropriate    Co-evaluation              AM-PAC PT "6 Clicks" Daily Activity  Outcome Measure  Difficulty turning over in bed (including adjusting bedclothes, sheets and blankets)?: A Little Difficulty moving from  lying on back to sitting on the side of the bed? : A Little Difficulty sitting down on and standing up from a chair with arms (e.g., wheelchair, bedside commode, etc,.)?: Unable Help needed moving to and from a bed to chair (including a wheelchair)?: A Little Help needed walking in hospital room?: A Little Help needed climbing 3-5 steps with a railing? : A Lot 6 Click Score: 15    End of Session   Activity Tolerance: Patient tolerated treatment well Patient left: in bed;with call bell/phone within reach;with bed alarm set   PT Visit Diagnosis: Unsteadiness on feet (R26.81);Pain;Difficulty in walking, not elsewhere classified (R26.2);Muscle weakness (generalized) (M62.81)     Time: 4098-1191 PT Time Calculation (min) (ACUTE ONLY): 36 min  Charges:  $Therapeutic Exercise: 23-37 mins                    G Codes:        Scot Dock, PTA 11/09/2016, 3:26 PM

## 2016-11-09 NOTE — Progress Notes (Addendum)
[facility]   PATIENT NAME: Lawrence West    MR#:  [id number]  DATE OF BIRTH:  [birth year]  SUBJECTIVE:  CHIEF COMPLAINT:   Chief Complaint  Patient presents with  . Weakness     Liver cirrhosis and recurrent ascites and paracentesis every week. Had done yesterday with 7.3 ltr fluid removed. Now came with chills , shaking.  Noted to be septic and have bacteremia.   Improved after 2 more ltr fluid removed by IR.   No complains, except- abdomen still distended.  REVIEW OF SYSTEMS:   CONSTITUTIONAL: No fever, fatigue or weakness.  EYES: No blurred or double vision.  EARS, NOSE, AND THROAT: No tinnitus or ear pain.  RESPIRATORY: No cough, shortness of breath, wheezing or hemoptysis.  CARDIOVASCULAR: No chest pain, orthopnea, edema.  GASTROINTESTINAL: No nausea, vomiting, diarrhea, some abdominal pain.  GENITOURINARY: No dysuria, hematuria.  ENDOCRINE: No polyuria, nocturia,  HEMATOLOGY: No anemia, easy bruising or bleeding SKIN: No rash or lesion. MUSCULOSKELETAL: No joint pain or arthritis.   NEUROLOGIC: No tingling, numbness, weakness.  PSYCHIATRY: No anxiety or depression.   ROS  DRUG ALLERGIES:  No Known Allergies  VITALS:  Blood pressure 134/82, pulse 78, temperature 98 F (36.7 C), temperature source Oral, resp. rate 18, height  (1.778 m), weight 90.7 kg (199 lb 15.3 oz), SpO2 96 %.  PHYSICAL EXAMINATION:  GENERAL:  71 y.o.-year-old patient lying in the bed with no acute distress earlier,  EYES: Pupils equal, round, reactive to light and accommodation. No scleral icterus. Extraocular muscles intact.  HEENT: Head atraumatic, normocephalic. Oropharynx and nasopharynx clear.  NECK:  Supple, no jugular venous distention. No thyroid enlargement, no tenderness.  LUNGS: Normal breath sounds bilaterally, no wheezing, some crepitation. No use of accessory muscles of respiration .  CARDIOVASCULAR: S1, S2 normal. No murmurs, rubs, or  gallops.  ABDOMEN: Soft, nontender, distended. Bowel sounds present. No organomegaly or mass.  EXTREMITIES: No pedal edema, cyanosis, or clubbing.  NEUROLOGIC: Cranial nerves II through XII are intact. Muscle strength 3-5/5 in all extremities. Sensation intact. Gait not checked. Mild shaking. PSYCHIATRIC: The patient is alert and oriented x 3.  SKIN: No obvious rash, lesion, or ulcer.   Physical Exam LABORATORY PANEL:   CBC  Recent Labs Lab 11/09/16 0419  WBC 4.9  HGB 12.0*  HCT 34.5*  PLT 71*   ------------------------------------------------------------------------------------------------------------------  Chemistries   Recent Labs Lab 11/09/16 0419  NA 136  K 4.1  CL 107  CO2 26  GLUCOSE 122*  BUN 20  CREATININE 1.06  CALCIUM 8.3*  AST 61*  ALT 22  ALKPHOS 77  BILITOT 2.2*   ------------------------------------------------------------------------------------------------------------------  Cardiac Enzymes  Recent Labs Lab 11/05/16 1442  TROPONINI <0.03   ------------------------------------------------------------------------------------------------------------------  RADIOLOGY:  US Paracentesis  Result Date: 11/09/2016 INDICATION: History of alcoholic cirrhosis with recurrent abdominal ascites. Request is made for therapeutic paracentesis. EXAM: ULTRASOUND GUIDED THERAPEUTIC PARACENTESIS MEDICATIONS: 1% lidocaine COMPLICATIONS: None immediate. PROCEDURE: Informed written consent was obtained from the patient after a discussion of the risks, benefits and alternatives to treatment. A timeout was performed prior to the initiation of the procedure. Initial ultrasound scanning demonstrates a large amount of ascites within the right upper abdominal quadrant. The right upper abdomen was prepped and draped in the usual sterile fashion. 1% lidocaine was used for local anesthesia. Following this, a 19 gauge, 7-cm, Yueh catheter was introduced. An ultrasound image was  saved for documentation purposes. The paracentesis was performed. The catheter  was removed and a dressing was applied. The patient tolerated the procedure well without immediate post procedural complication. FINDINGS: A total of approximately 6.2 L of serous fluid was removed. IMPRESSION: Successful ultrasound-guided paracentesis yielding 6.2 liters of peritoneal fluid. Read by: Barnetta Chapel, PA-C Electronically Signed   By: Jolaine Click M.D.   On: 11/09/2016 11:08    ASSESSMENT AND PLAN:   Active Problems:   Altered mental status   Respiratory distress  71 year old male with past medical history of alcoholic liver cirrhosis, hypertension, depression, GERD, obstructive sleep apnea, previous history of hepatic encephalopathy presents to the hospital due to tremors, altered mental status after paracentesis.  1. Sepsis, due to SBP, bacteremia   Ac hypoxic respi failure.  Altered mental status/weakness/tremor-  UA and Xray chest negative.  meropenem.  Spoke to Intensivist and [employer], 2 ltr Fluid removed 11/06/16- no high WBCs.  have final result on bl cx- klebsiella- change to Ancef, ID consult. Another paracentesis today followed by IV albumin.   Had 7 ltr paracentesis on 9/28, 2 ltr on 9/29 and another 6 ltr on 10/2.  2. History of hepatic encephalopathy-ammonia level close to baseline. Continue lactulose, Xifaxan.  Goal is to have 2-3 loose BM daily, if More- than can decrease on lactulose.  3. Depression-continue Lexapro.  4. Hyperlipidemia-continue atorvastatin.  5. GERD-continue Protonix.  6. Hx of Alcoholic Liver Cirrhosis - cont. Aldactone, Lasix, Lactulose, Xifaxan.      He drinks a lot of liquids and ice at home, councelled about fluid restrictions.  7. Thrombocytopenia    Due to liver disease.  8. Hypoalbuminemia   Due to liver disease, monitor.  Pt wants to go to rehab.  All the records are reviewed and case discussed with Care Management/Social  Worker. Management plans discussed with the patient, family and they are in agreement.  CODE STATUS: Full.  TOTAL TIME TAKING CARE OF THIS PATIENT: 35 minutes.   POSSIBLE D/C IN 1-2 DAYS, DEPENDING ON CLINICAL CONDITION.  Altamese Dilling M.D on 11/09/2016   Between 7am to 6pm - Pager - [phone]  After 6pm go to [email or web address] - password [email or web address]  [facility]  Office  [phone]  CC: Primary care physician; Kandyce Rud, MD  Note: This dictation was prepared with Dragon dictation along with smaller phrase technology. Any transcriptional errors that result from this process are unintentional.

## 2016-11-09 NOTE — Procedures (Signed)
Ultrasound-guided therapeutic paracentesis performed yielding 6.2 liters of serous colored fluid. No immediate complications.  Azari Janssens E 11:05 AM 11/09/2016

## 2016-11-10 DIAGNOSIS — K7290 Hepatic failure, unspecified without coma: Secondary | ICD-10-CM | POA: Diagnosis not present

## 2016-11-10 DIAGNOSIS — F339 Major depressive disorder, recurrent, unspecified: Secondary | ICD-10-CM | POA: Diagnosis not present

## 2016-11-10 DIAGNOSIS — R2681 Unsteadiness on feet: Secondary | ICD-10-CM | POA: Diagnosis not present

## 2016-11-10 DIAGNOSIS — R1314 Dysphagia, pharyngoesophageal phase: Secondary | ICD-10-CM | POA: Diagnosis not present

## 2016-11-10 DIAGNOSIS — R188 Other ascites: Secondary | ICD-10-CM | POA: Diagnosis not present

## 2016-11-10 DIAGNOSIS — Z7401 Bed confinement status: Secondary | ICD-10-CM | POA: Diagnosis not present

## 2016-11-10 DIAGNOSIS — K7031 Alcoholic cirrhosis of liver with ascites: Secondary | ICD-10-CM | POA: Diagnosis not present

## 2016-11-10 DIAGNOSIS — F10129 Alcohol abuse with intoxication, unspecified: Secondary | ICD-10-CM | POA: Diagnosis not present

## 2016-11-10 DIAGNOSIS — R296 Repeated falls: Secondary | ICD-10-CM | POA: Diagnosis not present

## 2016-11-10 DIAGNOSIS — Z23 Encounter for immunization: Secondary | ICD-10-CM | POA: Diagnosis not present

## 2016-11-10 DIAGNOSIS — M6281 Muscle weakness (generalized): Secondary | ICD-10-CM | POA: Diagnosis not present

## 2016-11-10 DIAGNOSIS — K659 Peritonitis, unspecified: Secondary | ICD-10-CM | POA: Diagnosis not present

## 2016-11-10 DIAGNOSIS — K219 Gastro-esophageal reflux disease without esophagitis: Secondary | ICD-10-CM | POA: Diagnosis not present

## 2016-11-10 DIAGNOSIS — F419 Anxiety disorder, unspecified: Secondary | ICD-10-CM | POA: Diagnosis not present

## 2016-11-10 DIAGNOSIS — F329 Major depressive disorder, single episode, unspecified: Secondary | ICD-10-CM | POA: Diagnosis not present

## 2016-11-10 DIAGNOSIS — K7210 Chronic hepatic failure without coma: Secondary | ICD-10-CM | POA: Diagnosis not present

## 2016-11-10 DIAGNOSIS — K7011 Alcoholic hepatitis with ascites: Secondary | ICD-10-CM | POA: Diagnosis not present

## 2016-11-10 DIAGNOSIS — Z7409 Other reduced mobility: Secondary | ICD-10-CM | POA: Diagnosis not present

## 2016-11-10 DIAGNOSIS — A419 Sepsis, unspecified organism: Secondary | ICD-10-CM | POA: Diagnosis not present

## 2016-11-10 DIAGNOSIS — K7460 Unspecified cirrhosis of liver: Secondary | ICD-10-CM | POA: Diagnosis not present

## 2016-11-10 DIAGNOSIS — R45851 Suicidal ideations: Secondary | ICD-10-CM | POA: Diagnosis not present

## 2016-11-10 MED ORDER — CEPHALEXIN 500 MG PO CAPS: 500.0000 mg | ORAL_CAPSULE | Freq: Three times a day (TID) | ORAL | 0 refills | Status: AC

## 2016-11-10 MED ORDER — LACTULOSE 10 GM/15ML PO SOLN: 20.0000 g | Freq: Two times a day (BID) | ORAL | 0 refills | Status: DC

## 2016-11-10 MED ORDER — OXYCODONE HCL 5 MG PO CAPS: 5.0000 mg | ORAL_CAPSULE | ORAL | 0 refills | Status: DC | PRN

## 2016-11-10 NOTE — Clinical Social Work Placement (Signed)
   CLINICAL SOCIAL WORK PLACEMENT  NOTE  Date:  11/10/2016  Patient Details  Name: LABAN OROURKE MRN: [id number] Date of Birth: Apr 01, 1945  Clinical Social Work is seeking post-discharge placement for this patient at the Skilled  Nursing Facility level of care (*CSW will initial, date and re-position this form in  chart as items are completed):  Yes   Patient/family provided with [place] Clinical Social Work Department's list of facilities offering this level of care within the geographic area requested by the patient (or if unable, by the patient's family).  Yes   Patient/family informed of their freedom to choose among providers that offer the needed level of care, that participate in Medicare, Medicaid or managed care program needed by the patient, have an available bed and are willing to accept the patient.  Yes   Patient/family informed of [facility]'s ownership interest in [facility] and [facility], as well as of the fact that they are under no obligation to receive care at these facilities.  PASRR submitted to EDS on       PASRR number received on       Existing PASRR number confirmed on 11/07/16     FL2 transmitted to all facilities in geographic area requested by pt/family on 11/07/16     FL2 transmitted to all facilities within larger geographic area on       Patient informed that his/her managed care company has contracts with or will negotiate with certain facilities, including the following:        Yes   Patient/family informed of bed offers received.  Patient chooses bed at  [facility] )     Physician recommends and patient chooses bed at      Patient to be transferred to  [facility] ) on 11/10/16.  Patient to be transferred to facility by  [facility] )     Patient family notified on 11/10/16 of transfer.  Name of family member notified:   (Patient's friend [name] is aware of D/C today. )     PHYSICIAN       Additional  Comment:    _______________________________________________ Sagar Tengan, Darleen Crocker, [name] 11/10/2016, 1:12 PM

## 2016-11-10 NOTE — Progress Notes (Signed)
Occupational Therapy Treatment Patient Details Name: Lawrence West MRN: [id number] DOB: 10-Oct-1945 Today's Date: 11/10/2016    History of present illness Patient is a 71 y.o. male admitted on 28 SEP with worsening shaking/confusion/weakness after having scheduled large volume paracentesis. PMH includes alcoholism, liver cirrhosis, hepatic encephalitis, OA, avascular necrosis of L hip, obstructive sleep apnea, gout, HTN, and HLD.   OT comments  Education/training provided in use of reacher and sock aid for LB dressing to minimize falls risk and maximize functional independence during dressing tasks with verbal instruction and visual demonstration. Pt verbalized understanding, politely declining to trial during session, as pt just woke up and is discharging to STR soon.    Follow Up Recommendations  SNF    Equipment Recommendations  3 in 1 bedside commode;Other (comment) (grab bar in shower, sock aid, reacher)    Recommendations for Other Services      Precautions / Restrictions Precautions Precautions: Fall Restrictions Weight Bearing Restrictions: No       Mobility Bed Mobility                  Transfers                      Balance                                           ADL either performed or assessed with clinical judgement   ADL Overall ADL's : Needs assistance/impaired                       Lower Body Dressing Details (indicate cue type and reason): Reviewed education/training for reacher and sock aid use to maximize safety and functional independence with LB ADL tasks. Pt verbalized understanding, politely declined to trial, as discharging to STR soon.                     Vision Baseline Vision/History: Wears glasses Patient Visual Report: No change from baseline Vision Assessment?: No apparent visual deficits   Perception     Praxis      Cognition Arousal/Alertness: Awake/alert Behavior During  Therapy: WFL for tasks assessed/performed Overall Cognitive Status: Within Functional Limits for tasks assessed                                          Exercises     Shoulder Instructions       General Comments      Pertinent Vitals/ Pain       Pain Assessment: No/denies pain  Home Living                                          Prior Functioning/Environment              Frequency  Min 1X/week        Progress Toward Goals  OT Goals(current goals can now be found in the care plan section)  Progress towards OT goals: Progressing toward goals  Acute Rehab OT Goals Patient Stated Goal: get stronger OT Goal Formulation: With patient Time For Goal Achievement: 11/22/16 Potential to Achieve Goals: Good  Plan  Discharge plan remains appropriate;Frequency remains appropriate    Co-evaluation                 AM-PAC PT "6 Clicks" Daily Activity     Outcome Measure   Help from another person eating meals?: None Help from another person taking care of personal grooming?: None Help from another person toileting, which includes using toliet, bedpan, or urinal?: A Little Help from another person bathing (including washing, rinsing, drying)?: A Lot Help from another person to put on and taking off regular upper body clothing?: A Little Help from another person to put on and taking off regular lower body clothing?: A Lot 6 Click Score: 18    End of Session    OT Visit Diagnosis: Other abnormalities of gait and mobility (R26.89);Muscle weakness (generalized) (M62.81);Repeated falls (R29.6)   Activity Tolerance Patient tolerated treatment well   Patient Left in bed;with call bell/phone within reach;with bed alarm set   Nurse Communication          Time: 1610-9604 OT Time Calculation (min): 13 min  Charges: OT General Charges $OT Visit: 1 Visit OT Treatments $Self Care/Home Management : 8-22 mins  Richrd Prime, MPH,  MS, OTR/L ascom [phone] 11/10/16, 2:57 PM

## 2016-11-10 NOTE — Care Management Important Message (Signed)
Important Message  Patient Details  Name: Lawrence West MRN: [id number] Date of Birth: April 29, 1945   Medicare Important Message Given:  Yes    Marily Memos, RN 11/10/2016, 10:54 AM

## 2016-11-10 NOTE — Progress Notes (Signed)
Patient is medically stable for D/C to [facility] today. Per [facility] admissions coordinator at [facility] patient can come today and [facility] authorization has been received. RN will call report and arrange EMS for transport. Clinical Child psychotherapist (CSW) sent D/C orders to [employer] via [employer]. Patient is aware of above. CSW contacted patient's sister [name] and made her aware of above. CSW contacted patient's friend [name] and made him aware of above. Please reconsult if future social work needs arise. CSW signing off.   [employer], [name] [phone]

## 2016-11-10 NOTE — Discharge Instructions (Signed)
[facility] [place] at [facility]  DIET:  Cardiac diet  DISCHARGE CONDITION:  Stable  ACTIVITY:  Activity as tolerated  OXYGEN:  Home Oxygen: Yes.     Oxygen Delivery: 2 liters/min via Patient connected to nasal cannula oxygen pt uses at night time mainly  DISCHARGE LOCATION:  nursing home    ADDITIONAL DISCHARGE INSTRUCTION: ultrasound guided paracentesis this friday   If you experience worsening of your admission symptoms, develop shortness of breath, life threatening emergency, suicidal or homicidal thoughts you must seek medical attention immediately by calling 911 or calling your MD immediately  if symptoms less severe.  You Must read complete instructions/literature along with all the possible adverse reactions/side effects for all the Medicines you take and that have been prescribed to you. Take any new Medicines after you have completely understood and accpet all the possible adverse reactions/side effects.   Please note  You were cared for by a hospitalist during your hospital stay. If you have any questions about your discharge medications or the care you received while you were in the hospital after you are discharged, you can call the unit and asked to speak with the hospitalist on call if the hospitalist that took care of you is not available. Once you are discharged, your primary care physician will handle any further medical issues. Please note that NO REFILLS for any discharge medications will be authorized once you are discharged, as it is imperative that you return to your primary care physician (or establish a relationship with a primary care physician if you do not have one) for your aftercare needs so that they can reassess your need for medications and monitor your lab values.

## 2016-11-10 NOTE — Consult Note (Signed)
   [facility] CM Inpatient Consult   11/10/2016  Lawrence West [birth year] [id number]   Patient screened for potential [facility] Care Management services. Patient is on the [facility] registry as a benefit of their [employer] . Electronic medical record reveals patient's discharge plan is SNF. [facility] Care Management services not appropriate at this time. If patient's post hospital needs change please place a [facility] Care Management consult. For questions please contact:   Lunden Stieber RN, BSN [facility] Liaison  [phone]) Business Mobile [phone]) Toll free office

## 2016-11-10 NOTE — Discharge Summary (Signed)
[facility] - [place] at [facility], 71 y.o., DOB [birth year], MRN [id number]. Admission date: 11/05/2016 Discharge Date 11/10/2016 Primary MD Kandyce Rud, MD Admitting Physician Houston Siren, MD  Admission Diagnosis  Lightheadedness [R42] Hyperammonemia (HCC) [E72.20] Altered mental status, unspecified altered mental status type [R41.82]  Discharge Diagnosis   Active Problems: Acute encephalopathy due to hepatic encephalopathy Klebsiella bacteremia with sepsis source likely SBP Recurrent ascites with recurrent paracentesis may benefit from chronic indwelling catheter primary GI to assist in arranging this Depression Hyperlipidemia GERD Alcoholic liver cirrhosis Thrombocytopenia     Hospital Course  Lawrence West  is a 71 y.o. male with a known history of Alcoholic liver cirrhosis, history of hepatic encephalopathy, osteoarthritis, avascular process of the left hip, obstructive sleep apnea, gout, hypertension, hyperlipidemia who presents to the hospital due to tremors, altered mental status. Patient initially was thought to have acute hepatic encephalopathy , he was treated with lactulose and his Xifaxan was continued. Patient's confusion improved. Further workup revealed him to have sepsis related to Klebsiella. He was seen in consultation by ID who recommended IV antibiotics while in the hospital and oral antibiotics on discharge. The source was likely GI.  Patient also has had recurrent paracentesis requiring drainage. She needs to follow up with his primary GI physician to decide whether he would benefit from a chronic indwelling catheter for recurrent ascites. He does have an appointment to have this read drain on Friday.         Consults  ID  Significant Tests:  See full reports for all details     Dg Chest 1 View  Result Date: 11/05/2016 CLINICAL DATA:  Shortness of breath EXAM: CHEST 1 VIEW COMPARISON:  06/18/2016, CT chest  05/30/2008, 12/27/2015, 12/31/2015 FINDINGS: No acute consolidation or effusion. Stable cardiomediastinal silhouette ; retrocardiac opacity similar to 06/18/2016, possibly due to hiatal hernia or tortuous aorta. No pneumothorax. IMPRESSION: No acute infiltrate or edema. Electronically Signed   By: Jasmine Pang M.D.   On: 11/05/2016 15:09   Ct Head Wo Contrast  Result Date: 11/05/2016 CLINICAL DATA:  Altered level of consciousness. EXAM: CT HEAD WITHOUT CONTRAST TECHNIQUE: Contiguous axial images were obtained from the base of the skull through the vertex without intravenous contrast. COMPARISON:  CT head 06/06/2016 FINDINGS: Brain: Moderate atrophy. Negative for hydrocephalus. Negative for acute infarct, hemorrhage, or mass lesion. No fluid collection or shift of the midline structures. Vascular: Negative for hyperdense vessel Skull: Negative Sinuses/Orbits: Negative Other: None IMPRESSION: Generalized atrophy.  No acute abnormality. Electronically Signed   By: Marlan Palau M.D.   On: 11/05/2016 15:48   US Paracentesis  Result Date: 11/09/2016 INDICATION: History of alcoholic cirrhosis with recurrent abdominal ascites. Request is made for therapeutic paracentesis. EXAM: ULTRASOUND GUIDED THERAPEUTIC PARACENTESIS MEDICATIONS: 1% lidocaine COMPLICATIONS: None immediate. PROCEDURE: Informed written consent was obtained from the patient after a discussion of the risks, benefits and alternatives to treatment. A timeout was performed prior to the initiation of the procedure. Initial ultrasound scanning demonstrates a large amount of ascites within the right upper abdominal quadrant. The right upper abdomen was prepped and draped in the usual sterile fashion. 1% lidocaine was used for local anesthesia. Following this, a 19 gauge, 7-cm, Yueh catheter was introduced. An ultrasound image was saved for documentation purposes. The paracentesis was performed. The catheter was removed and a dressing was applied. The  patient tolerated the procedure well without immediate post procedural complication. FINDINGS: A total of approximately 6.2 L  of serous fluid was removed. IMPRESSION: Successful ultrasound-guided paracentesis yielding 6.2 liters of peritoneal fluid. Read by: Barnetta Chapel, PA-C Electronically Signed   By: Jolaine Click M.D.   On: 11/09/2016 11:08   US Paracentesis  Result Date: 11/06/2016 INDICATION: Patient admitted with tremors yesterday after outpatient paracentesis. Patient's history is for to the ICU secondary to sepsis. Request is made for diagnostic and therapeutic paracentesis with a maximum of 2 L to rule out spontaneous bacterial peritonitis. 25 g of albumin was requested by the attending ICU physician secondary to hypotension. EXAM: ULTRASOUND GUIDED DIAGNOSTIC AND THERAPEUTIC PARACENTESIS MEDICATIONS: 1% xylocaine COMPLICATIONS: None immediate. PROCEDURE: Informed written consent was obtained from the patient after a discussion of the risks, benefits and alternatives to treatment. A timeout was performed prior to the initiation of the procedure. Initial ultrasound scanning demonstrates a small amount of ascites within the right upper abdominal quadrant. The right upper abdomen was prepped and draped in the usual sterile fashion. 1% xylocaine was used for local anesthesia. Following this, a 19 gauge, 7-cm, Yueh catheter was introduced. An ultrasound image was saved for documentation purposes. The paracentesis was performed. The catheter was removed and a dressing was applied. The patient tolerated the procedure well without immediate post procedural complication. His blood pressure remains around 80/50 the entire procedure. FINDINGS: A total of approximately 2 L of clear, serous fluid was removed. Samples were sent to the laboratory as requested by the clinical team. IMPRESSION: Successful ultrasound-guided paracentesis yielding 2 liters of peritoneal fluid. Samples were sent to the laboratory as  requested by the providing clinical team. Read by: Barnetta Chapel, PA-C Electronically Signed   By: Simonne Come M.D.   On: 11/06/2016 15:01   US Paracentesis  Result Date: 11/05/2016 INDICATION: History of alcoholic cirrhosis with recurrent abdominal ascites. Request is made for therapeutic paracentesis. EXAM: ULTRASOUND GUIDED THERAPEUTIC PARACENTESIS MEDICATIONS: 1% xylocaine COMPLICATIONS: None immediate. PROCEDURE: Informed written consent was obtained from the patient after a discussion of the risks, benefits and alternatives to treatment. A timeout was performed prior to the initiation of the procedure. Initial ultrasound scanning demonstrates a large amount of ascites within the right lower abdominal quadrant. The right lower abdomen was prepped and draped in the usual sterile fashion. 1% xylocaine was used for local anesthesia. Following this, a Safe-T-Centesis catheter was introduced. An ultrasound image was saved for documentation purposes. The paracentesis was performed. The catheter was removed and a dressing was applied. When the procedure was over the nurse and ultrasound technologist noticed that the patient was shaking. They also felt like he had an altered mentation from prior to his procedure. His blood pressure was normal and his glucose was found to be 130. The patient did state that he had been having a lot of diarrhea from his lactulose. The patient was evaluated and the recommendation was made for him to be evaluated in the emergency department. There were no immediate complications from the procedure itself. FINDINGS: A total of approximately 7.3 L of serous fluid was removed. IMPRESSION: Successful ultrasound-guided paracentesis yielding 7.3 L liters of peritoneal fluid. Please see above for further details regarding postprocedure disposition. Read by: Barnetta Chapel, PA-C Electronically Signed   By: Richarda Overlie M.D.   On: 11/05/2016 14:51   US Paracentesis  Result Date:  10/29/2016 INDICATION: Alcoholic cirrhosis, recurrent ascites. Request made for therapeutic paracentesis. EXAM: ULTRASOUND GUIDED THERAPEUTIC PARACENTESIS MEDICATIONS: None. COMPLICATIONS: None immediate. PROCEDURE: Informed written consent was obtained from the patient after a discussion of  the risks, benefits and alternatives to treatment. A timeout was performed prior to the initiation of the procedure. Initial ultrasound scanning demonstrates a large amount of ascites within the right lower abdominal quadrant. The right lower abdomen was prepped and draped in the usual sterile fashion. 1% lidocaine was used for local anesthesia. Following this, a Yueh catheter was introduced. An ultrasound image was saved for documentation purposes. The paracentesis was performed. The catheter was removed and a dressing was applied. The patient tolerated the procedure well without immediate post procedural complication. FINDINGS: A total of approximately 7.7 liters of yellow fluid was removed. IMPRESSION: Successful ultrasound-guided therapeutic paracentesis yielding 7.7 liters of peritoneal fluid. The patient will receive IV albumin infusion postprocedure. Read by: Jeananne Rama, PA-C Electronically Signed   By: Richarda Overlie M.D.   On: 10/29/2016 14:40   US Paracentesis  Result Date: 10/21/2016 INDICATION: Recurrent symptomatic ascites. EXAM: ULTRASOUND-GUIDED PARACENTESIS COMPARISON:  Multiple previous ultrasound-guided paracenteses, most recently on 10/15/2016 MEDICATIONS: None. COMPLICATIONS: None immediate. TECHNIQUE: Informed written consent was obtained from the patient after a discussion of the risks, benefits and alternatives to treatment. A timeout was performed prior to the initiation of the procedure. Initial ultrasound scanning demonstrates a large amount of ascites within the right lower abdominal quadrant. The right lower abdomen was prepped and draped in the usual sterile fashion. 1% lidocaine with epinephrine  was used for local anesthesia. Under direct ultrasound guidance, a 19 gauge, 7-cm, Yueh catheter was introduced. An ultrasound image was saved for documentation purposed. An 8 Fr Safe-T-Centesis catheter was introduced. The paracentesis was performed. The catheter was removed and a dressing was applied. The patient tolerated the procedure well without immediate post procedural complication. FINDINGS: A total of approximately 6.5 liters of serous fluid was removed. IMPRESSION: Successful ultrasound-guided paracentesis yielding 6.5 liters of peritoneal fluid. Electronically Signed   By: Simonne Come M.D.   On: 10/21/2016 13:42   US Paracentesis  Result Date: 10/15/2016 INDICATION: History of cirrhosis, now with recurrent symptomatic ascites. EXAM: ULTRASOUND-GUIDED PARACENTESIS COMPARISON:  Multiple previous ultrasound-guided paracenteses, most recently on 10/08/2016 MEDICATIONS: None. COMPLICATIONS: None immediate. TECHNIQUE: Informed written consent was obtained from the patient after a discussion of the risks, benefits and alternatives to treatment. A timeout was performed prior to the initiation of the procedure. Initial ultrasound scanning demonstrates a large amount of ascites within the right lower abdominal quadrant. The right lower abdomen was prepped and draped in the usual sterile fashion. 1% lidocaine with epinephrine was used for local anesthesia. Under direct ultrasound guidance, a 19 gauge, 7-cm, Yueh catheter was introduced. An ultrasound image was saved for documentation purposed. The paracentesis was performed. The catheter was removed and a dressing was applied. The patient tolerated the procedure well without immediate post procedural complication. FINDINGS: A total of approximately 7.7 liters of serous fluid was removed. IMPRESSION: Successful ultrasound-guided paracentesis yielding 7.7 liters of peritoneal fluid. Electronically Signed   By: Simonne Come M.D.   On: 10/15/2016 14:36   Dg Chest  Port 1 View  Result Date: 11/07/2016 CLINICAL DATA:  Hypoxia EXAM: PORTABLE CHEST 1 VIEW COMPARISON:  11/06/2016 and prior radiographs FINDINGS: Cardiomegaly, interstitial prominence and oval retrocardiac opacity are unchanged. There is no evidence of pleural effusion, pneumothorax or airspace disease. No significant change from prior study. IMPRESSION: Unchanged appearance of the chest with cardiomegaly and stable left retrocardiac opacity. Electronically Signed   By: Harmon Pier M.D.   On: 11/07/2016 08:13   Dg Chest [facility]  Result Date: 11/06/2016 CLINICAL DATA:  Hypoxia. EXAM: PORTABLE CHEST 1 VIEW COMPARISON:  11/05/2016.  Chest CT dated 05/30/2008. FINDINGS: Stable enlarged cardiac silhouette with a prominent left ventricular contour. Stable left retrocardiac oval density. Clear lungs with prominent interstitial markings without significant change when a decreased depth of inspiration is taken into account. Thoracic spine and bilateral shoulder degenerative changes. IMPRESSION: 1. No acute abnormality. 2. Stable cardiomegaly with probable left ventricular hypertrophy. 3. Stable left retrocardiac opacity, possibly representing a hiatal hernia not present on the previous CT. Electronically Signed   By: Beckie Salts M.D.   On: 11/06/2016 12:50       Today   Subjective:   Lawrence West patient's mental status back to baseline. Back to baseline  Objective:   Blood pressure 117/65, pulse 83, temperature 98.3 F (36.8 C), temperature source Oral, resp. rate 20, height 5\' 10"  (1.778 m), weight 199 lb 15.3 oz (90.7 kg), SpO2 98 %.  .  Intake/Output Summary (Last 24 hours) at 11/10/16 1029 Last data filed at 11/10/16 1012  Gross per 24 hour  Intake              700 ml  Output              500 ml  Net              200 ml    Exam VITAL SIGNS: Blood pressure 117/65, pulse 83, temperature 98.3 F (36.8 C), temperature source Oral, resp. rate 20, height 5\' 10"  (1.778 m), weight 199 lb  15.3 oz (90.7 kg), SpO2 98 %.  GENERAL:  71 y.o.-year-old patient lying in the bed with no acute distress.  EYES: Pupils equal, round, reactive to light and accommodation. No scleral icterus. Extraocular muscles intact.  HEENT: Head atraumatic, normocephalic. Oropharynx and nasopharynx clear.  NECK:  Supple, no jugular venous distention. No thyroid enlargement, no tenderness.  LUNGS: Normal breath sounds bilaterally, no wheezing, rales,rhonchi or crepitation. No use of accessory muscles of respiration.  CARDIOVASCULAR: S1, S2 normal. No murmurs, rubs, or gallops.  ABDOMEN: Soft, nontender,  + distended,  Bowel sounds present. No organomegaly or mass.  EXTREMITIES: No pedal edema, cyanosis, or clubbing.  NEUROLOGIC: Cranial nerves II through XII are intact. Muscle strength 5/5 in all extremities. Sensation intact. Gait not checked.  PSYCHIATRIC: The patient is alert and oriented x 3.  SKIN: No obvious rash, lesion, or ulcer.   Data Review     CBC w Diff: Lab Results  Component Value Date   WBC 4.9 11/09/2016   HGB 12.0 (L) 11/09/2016   HGB 16.1 10/23/2013   HCT 34.5 (L) 11/09/2016   HCT 48.9 10/23/2013   PLT 71 (L) 11/09/2016   PLT 155 10/23/2013   LYMPHOPCT 36 05/30/2008   MONOPCT 12 05/30/2008   EOSPCT 1 05/30/2008   BASOPCT 1 05/30/2008   CMP: Lab Results  Component Value Date   NA 136 11/09/2016   NA 134 (A) 07/15/2015   NA 138 10/23/2013   K 4.1 11/09/2016   K 3.6 10/23/2013   CL 107 11/09/2016   CL 104 10/23/2013   CO2 26 11/09/2016   CO2 24 10/23/2013   BUN 20 11/09/2016   BUN 21 07/15/2015   BUN 8 10/23/2013   CREATININE 1.06 11/09/2016   CREATININE 0.95 10/23/2013   GLU 139 07/15/2015   PROT 5.1 (L) 11/09/2016   PROT 8.5 (H) 10/23/2013   ALBUMIN 2.3 (L) 11/09/2016   ALBUMIN 3.7 10/23/2013  BILITOT 2.2 (H) 11/09/2016   BILITOT 1.8 (H) 10/23/2013   ALKPHOS 77 11/09/2016   ALKPHOS 82 10/23/2013   AST 61 (H) 11/09/2016   AST 73 (H) 10/23/2013   ALT 22  11/09/2016   ALT 41 10/23/2013  .  Micro Results Recent Results (from the past 240 hour(s))  Blood culture (routine x 2)     Status: Abnormal   Collection Time: 11/05/16  2:30 PM  Result Value Ref Range Status   Specimen Description BLOOD BLOOD LEFT HAND  Final   Special Requests   Final    BOTTLES DRAWN AEROBIC AND ANAEROBIC Blood Culture adequate volume   Culture  Setup Time   Final    GRAM NEGATIVE RODS IN BOTH AEROBIC AND ANAEROBIC BOTTLES CRITICAL RESULT CALLED TO, READ BACK BY AND VERIFIED WITH: Hillis BESANTI AT 1610 11/06/16.PMH    Culture KLEBSIELLA PNEUMONIAE (A)  Final   Report Status 11/08/2016 FINAL  Final   Organism ID, Bacteria KLEBSIELLA PNEUMONIAE  Final      Susceptibility   Klebsiella pneumoniae - MIC*    AMPICILLIN >=32 RESISTANT Resistant     CEFAZOLIN <=4 SENSITIVE Sensitive     CEFEPIME <=1 SENSITIVE Sensitive     CEFTAZIDIME <=1 SENSITIVE Sensitive     CEFTRIAXONE <=1 SENSITIVE Sensitive     CIPROFLOXACIN <=0.25 SENSITIVE Sensitive     GENTAMICIN <=1 SENSITIVE Sensitive     IMIPENEM <=0.25 SENSITIVE Sensitive     TRIMETH/SULFA <=20 SENSITIVE Sensitive     AMPICILLIN/SULBACTAM 4 SENSITIVE Sensitive     PIP/TAZO <=4 SENSITIVE Sensitive     Extended ESBL NEGATIVE Sensitive     * KLEBSIELLA PNEUMONIAE  Blood Culture ID Panel (Reflexed)     Status: Abnormal   Collection Time: 11/05/16  2:30 PM  Result Value Ref Range Status   Enterococcus species NOT DETECTED NOT DETECTED Final   Listeria monocytogenes NOT DETECTED NOT DETECTED Final   Staphylococcus species NOT DETECTED NOT DETECTED Final   Staphylococcus aureus NOT DETECTED NOT DETECTED Final   Streptococcus species NOT DETECTED NOT DETECTED Final   Streptococcus agalactiae NOT DETECTED NOT DETECTED Final   Streptococcus pneumoniae NOT DETECTED NOT DETECTED Final   Streptococcus pyogenes NOT DETECTED NOT DETECTED Final   Acinetobacter baumannii NOT DETECTED NOT DETECTED Final   Enterobacteriaceae  species DETECTED (A) NOT DETECTED Final    Comment: Enterobacteriaceae represent a large family of gram-negative bacteria, not a single organism. CRITICAL RESULT CALLED TO, READ BACK BY AND VERIFIED WITH: Taiten BESANTI AT 9604 11/06/16.PMH    Enterobacter cloacae complex NOT DETECTED NOT DETECTED Final   Escherichia coli NOT DETECTED NOT DETECTED Final   Klebsiella oxytoca NOT DETECTED NOT DETECTED Final   Klebsiella pneumoniae DETECTED (A) NOT DETECTED Final    Comment: CRITICAL RESULT CALLED TO, READ BACK BY AND VERIFIED WITH: Kit BESANTI AT 5409 11/06/16.PMH    Proteus species NOT DETECTED NOT DETECTED Final   Serratia marcescens NOT DETECTED NOT DETECTED Final   Carbapenem resistance NOT DETECTED NOT DETECTED Final   Haemophilus influenzae NOT DETECTED NOT DETECTED Final   Neisseria meningitidis NOT DETECTED NOT DETECTED Final   Pseudomonas aeruginosa NOT DETECTED NOT DETECTED Final   Candida albicans NOT DETECTED NOT DETECTED Final   Candida glabrata NOT DETECTED NOT DETECTED Final   Candida krusei NOT DETECTED NOT DETECTED Final   Candida parapsilosis NOT DETECTED NOT DETECTED Final   Candida tropicalis NOT DETECTED NOT DETECTED Final  Blood culture (routine x 2)  Status: Abnormal   Collection Time: 11/05/16  2:42 PM  Result Value Ref Range Status   Specimen Description BLOOD BLOOD LEFT ARM  Final   Special Requests   Final    BOTTLES DRAWN AEROBIC AND ANAEROBIC Blood Culture adequate volume   Culture  Setup Time   Final    GRAM NEGATIVE RODS IN BOTH AEROBIC AND ANAEROBIC BOTTLES CRITICAL RESULT CALLED TO, READ BACK BY AND VERIFIED WITH: Nareg BESANTI AT 3086 11/06/16.PMH    Culture (A)  Final    KLEBSIELLA PNEUMONIAE SUSCEPTIBILITIES PERFORMED ON PREVIOUS CULTURE WITHIN THE LAST 5 DAYS. Performed at [facility], [address] [place], [place] [address]    Report Status 11/08/2016 FINAL  Final  Culture, blood (single) w Reflex to ID Panel     Status: None  (Preliminary result)   Collection Time: 11/07/16  5:43 AM  Result Value Ref Range Status   Specimen Description BLOOD BLOOD RIGHT HAND  Final   Special Requests   Final    BOTTLES DRAWN AEROBIC AND ANAEROBIC Blood Culture adequate volume   Culture NO GROWTH 3 DAYS  Final   Report Status PENDING  Incomplete        Code Status Orders        Start     Ordered   11/05/16 1844  Full code  Continuous     11/05/16 1844    Code Status History    Date Active Date Inactive Code Status Order ID Comments User Context   06/23/2016 11:53 PM 06/25/2016  5:50 PM Full Code [id number]  Houston Siren, MD Inpatient   06/18/2016  9:53 PM 06/19/2016  5:37 PM DNR [id number]  Altamese Dilling, MD Inpatient   12/27/2015  3:37 PM 12/30/2015 10:03 PM Full Code [id number]  Adrian Saran, MD Inpatient   05/19/2015  7:43 PM 05/20/2015  6:19 PM Full Code [id number]  Houston Siren, MD Inpatient    Advance Directive Documentation     Most Recent Value  Type of Advance Directive  Healthcare Power of Attorney  Pre-existing out of facility DNR order (yellow form or pink MOST form)  -  "MOST" Form in Place?  -           Contact information for follow-up providers    Scot Jun, MD Follow up in 3 day(s).   Specialty:  Gastroenterology Why:  for eval for possible chronic dwelling paracentesis catherter Contact information: [address] [place] [address] [phone]            Contact information for after-discharge care    Destination    [facility] Follow up.   Specialty:  Skilled Nursing Facility Contact information: [address] [place] [address] [phone]                  Discharge Medications   Allergies as of 11/10/2016   No Known Allergies     Medication List    STOP taking these medications   traMADol 50 MG tablet Commonly known as:  ULTRAM     TAKE these medications   atorvastatin 10 MG tablet Commonly  known as:  LIPITOR Take 1 tablet (10 mg total) by mouth daily.   bismuth subsalicylate 262 MG/15ML suspension Commonly known as:  PEPTO BISMOL Take 30 mLs by mouth every 6 (six) hours as needed.   cephALEXin 500 MG capsule Commonly known as:  KEFLEX Take 1 capsule (500 mg total) by mouth 3 (  three) times daily.   escitalopram 20 MG tablet Commonly known as:  LEXAPRO Take 20 mg by mouth daily.   furosemide 20 MG tablet Commonly known as:  LASIX Take 20 mg by mouth daily.   lactulose 10 GM/15ML solution Commonly known as:  CHRONULAC Take 30 mLs (20 g total) by mouth 2 (two) times daily. If patient has 3 good bowel movements don't need to give him next dose in evening What changed:  additional instructions   lamoTRIgine 25 MG tablet Commonly known as:  LAMICTAL Take 25 mg by mouth 2 (two) times daily.   Magnesium 400 MG Tabs Take 1 tablet by mouth 2 (two) times daily.   ondansetron 4 MG tablet Commonly known as:  ZOFRAN Take 1 tablet (4 mg total) by mouth every 8 (eight) hours as needed for nausea or vomiting.   oxycodone 5 MG capsule Commonly known as:  OXY-IR Take 1 capsule (5 mg total) by mouth every 4 (four) hours as needed.   pantoprazole 40 MG tablet Commonly known as:  PROTONIX Take 1 tablet (40 mg total) by mouth 2 (two) times daily.   rifaximin 550 MG Tabs tablet Commonly known as:  XIFAXAN Take 1 tablet (550 mg total) by mouth 2 (two) times daily.   spironolactone 25 MG tablet Commonly known as:  ALDACTONE Take 1 tablet (25 mg total) by mouth daily.          Total Time in preparing paper work, data evaluation and todays exam - 35 minutes  Auburn Bilberry M.D on 11/10/2016 at 10:29 AM  [facility]   Office  [phone]

## 2016-11-10 NOTE — Progress Notes (Signed)
Report was called to [facility],  EMS was called also. Patient have no complaints or SS of distress

## 2016-11-11 DIAGNOSIS — K7460 Unspecified cirrhosis of liver: Secondary | ICD-10-CM | POA: Diagnosis not present

## 2016-11-11 DIAGNOSIS — K659 Peritonitis, unspecified: Secondary | ICD-10-CM | POA: Diagnosis not present

## 2016-11-11 DIAGNOSIS — M6281 Muscle weakness (generalized): Secondary | ICD-10-CM | POA: Diagnosis not present

## 2016-11-11 DIAGNOSIS — R188 Other ascites: Secondary | ICD-10-CM | POA: Diagnosis not present

## 2016-11-11 DIAGNOSIS — K7290 Hepatic failure, unspecified without coma: Secondary | ICD-10-CM | POA: Diagnosis not present

## 2016-11-12 ENCOUNTER — Ambulatory Visit
Admission: RE | Admit: 2016-11-12 | Discharge: 2016-11-12 | Disposition: A | Discharge status: Home / Self Care | Payer: Medicare HMO | Source: Ambulatory Visit | Attending: Unknown Physician Specialty | Admitting: Unknown Physician Specialty

## 2016-11-12 DIAGNOSIS — R188 Other ascites: Secondary | ICD-10-CM | POA: Insufficient documentation

## 2016-11-12 DIAGNOSIS — K7031 Alcoholic cirrhosis of liver with ascites: Secondary | ICD-10-CM

## 2016-11-12 LAB — CULTURE, BLOOD (SINGLE)
CULTURE: NO GROWTH
Special Requests: ADEQUATE

## 2016-11-12 MED ORDER — ALBUMIN HUMAN 25 % IV SOLN
INTRAVENOUS | Status: AC
  Administered 2016-11-12: 25 g via INTRAVENOUS
  Filled 2016-11-12: qty 100

## 2016-11-12 MED ORDER — ALBUMIN HUMAN 25 % IV SOLN
25.0000 g | Freq: Once | INTRAVENOUS | Status: AC
  Administered 2016-11-12: 25 g via INTRAVENOUS
  Filled 2016-11-12: qty 100

## 2016-11-12 NOTE — Progress Notes (Signed)
Pt's albumin infusion complete, pt tolerated well.  Discharge instructions reviewed and pt verbalizes understanding.  Pt ambulatory to bathroom w/o difficulty.  Pt taken to medical mall via wheelchair to await ride from rehab facility.

## 2016-11-15 DIAGNOSIS — K659 Peritonitis, unspecified: Secondary | ICD-10-CM | POA: Diagnosis not present

## 2016-11-15 DIAGNOSIS — K7290 Hepatic failure, unspecified without coma: Secondary | ICD-10-CM | POA: Diagnosis not present

## 2016-11-15 DIAGNOSIS — M6281 Muscle weakness (generalized): Secondary | ICD-10-CM | POA: Diagnosis not present

## 2016-11-15 DIAGNOSIS — Z7409 Other reduced mobility: Secondary | ICD-10-CM | POA: Diagnosis not present

## 2016-11-15 DIAGNOSIS — K7460 Unspecified cirrhosis of liver: Secondary | ICD-10-CM | POA: Diagnosis not present

## 2016-11-16 NOTE — Discharge Instructions (Signed)
Paracentesis, Care After °Refer to this sheet in the next few weeks. These instructions provide you with information about caring for yourself after your procedure. Your health care provider may also give you more specific instructions. Your treatment has been planned according to current medical practices, but problems sometimes occur. Call your health care provider if you have any problems or questions after your procedure. °What can I expect after the procedure? °After your procedure, it is common to have a small amount of clear fluid coming from the puncture site. °Follow these instructions at home: °· Return to your normal activities as told by your health care provider. Ask your health care provider what activities are safe for you. °· Take over-the-counter and prescription medicines only as told by your health care provider. °· Do not take baths, swim, or use a hot tub until your health care provider approves. °· Follow instructions from your health care provider about: °? How to take care of your puncture site. °? When and how you should change your bandage (dressing). °? When you should remove your dressing. °· Check your puncture area every day signs of infection. Watch for: °? Redness, swelling, or pain. °? Fluid, blood, or pus. °· Keep all follow-up visits as told by your health care provider. This is important. °Contact a health care provider if: °· You have redness, swelling, or pain at your puncture site. °· You start to have more clear fluid coming from your puncture site. °· You have blood or pus coming from your puncture site. °· You have chills. °· You have a fever. °Get help right away if: °· You develop chest pain or shortness of breath. °· You develop increasing pain, discomfort, or swelling in your abdomen. °· You feel dizzy or light-headed or you pass out. °This information is not intended to replace advice given to you by your health care provider. Make sure you discuss any questions you  have with your health care provider. °Document Released: 06/11/2014 Document Revised: 07/03/2015 Document Reviewed: 04/09/2014 °Elsevier Interactive Patient Education © 2018 [employer] ° °

## 2016-11-19 ENCOUNTER — Ambulatory Visit
Admission: RE | Admit: 2016-11-19 | Discharge: 2016-11-19 | Disposition: A | Discharge status: Home / Self Care | Payer: Medicare HMO | Source: Ambulatory Visit | Attending: Unknown Physician Specialty | Admitting: Unknown Physician Specialty

## 2016-11-19 DIAGNOSIS — K659 Peritonitis, unspecified: Secondary | ICD-10-CM | POA: Diagnosis not present

## 2016-11-19 DIAGNOSIS — K219 Gastro-esophageal reflux disease without esophagitis: Secondary | ICD-10-CM | POA: Diagnosis not present

## 2016-11-19 DIAGNOSIS — R188 Other ascites: Secondary | ICD-10-CM | POA: Diagnosis not present

## 2016-11-19 DIAGNOSIS — K7460 Unspecified cirrhosis of liver: Secondary | ICD-10-CM | POA: Diagnosis not present

## 2016-11-19 DIAGNOSIS — K7031 Alcoholic cirrhosis of liver with ascites: Secondary | ICD-10-CM | POA: Insufficient documentation

## 2016-11-19 MED ORDER — ALBUMIN HUMAN 25 % IV SOLN
INTRAVENOUS | Status: AC
  Administered 2016-11-19: 25 g
  Filled 2016-11-19: qty 100

## 2016-11-19 MED ORDER — SODIUM CHLORIDE FLUSH 0.9 % IV SOLN
INTRAVENOUS | Status: AC
  Filled 2016-11-19: qty 10

## 2016-11-19 NOTE — Procedures (Signed)
PROCEDURE SUMMARY:  Successful US guided paracentesis from left lateral abdomen.  Yielded 6.5 liters of clear yellow fluid.  No immediate complications.  Pt tolerated well.   Vadie Principato S Madalen Gavin PA-C 11/19/2016 3:15 PM

## 2016-11-24 NOTE — Discharge Instructions (Signed)
Paracentesis, Care After °Refer to this sheet in the next few weeks. These instructions provide you with information about caring for yourself after your procedure. Your health care provider may also give you more specific instructions. Your treatment has been planned according to current medical practices, but problems sometimes occur. Call your health care provider if you have any problems or questions after your procedure. °What can I expect after the procedure? °After your procedure, it is common to have a small amount of clear fluid coming from the puncture site. °Follow these instructions at home: °· Return to your normal activities as told by your health care provider. Ask your health care provider what activities are safe for you. °· Take over-the-counter and prescription medicines only as told by your health care provider. °· Do not take baths, swim, or use a hot tub until your health care provider approves. °· Follow instructions from your health care provider about: °? How to take care of your puncture site. °? When and how you should change your bandage (dressing). °? When you should remove your dressing. °· Check your puncture area every day signs of infection. Watch for: °? Redness, swelling, or pain. °? Fluid, blood, or pus. °· Keep all follow-up visits as told by your health care provider. This is important. °Contact a health care provider if: °· You have redness, swelling, or pain at your puncture site. °· You start to have more clear fluid coming from your puncture site. °· You have blood or pus coming from your puncture site. °· You have chills. °· You have a fever. °Get help right away if: °· You develop chest pain or shortness of breath. °· You develop increasing pain, discomfort, or swelling in your abdomen. °· You feel dizzy or light-headed or you pass out. °This information is not intended to replace advice given to you by your health care provider. Make sure you discuss any questions you  have with your health care provider. °Document Released: 06/11/2014 Document Revised: 07/03/2015 Document Reviewed: 04/09/2014 °Elsevier Interactive Patient Education © 2018 [employer] ° °

## 2016-11-25 DIAGNOSIS — K7460 Unspecified cirrhosis of liver: Secondary | ICD-10-CM | POA: Diagnosis not present

## 2016-11-25 DIAGNOSIS — Z7409 Other reduced mobility: Secondary | ICD-10-CM | POA: Diagnosis not present

## 2016-11-25 DIAGNOSIS — R188 Other ascites: Secondary | ICD-10-CM | POA: Diagnosis not present

## 2016-11-25 DIAGNOSIS — F419 Anxiety disorder, unspecified: Secondary | ICD-10-CM | POA: Diagnosis not present

## 2016-11-26 ENCOUNTER — Ambulatory Visit
Admission: RE | Admit: 2016-11-26 | Discharge: 2016-11-26 | Disposition: A | Discharge status: Home / Self Care | Payer: Medicare HMO | Source: Ambulatory Visit | Attending: Unknown Physician Specialty | Admitting: Unknown Physician Specialty

## 2016-11-26 DIAGNOSIS — F419 Anxiety disorder, unspecified: Secondary | ICD-10-CM | POA: Diagnosis not present

## 2016-11-26 DIAGNOSIS — R188 Other ascites: Secondary | ICD-10-CM | POA: Diagnosis not present

## 2016-11-26 DIAGNOSIS — K7210 Chronic hepatic failure without coma: Secondary | ICD-10-CM | POA: Diagnosis not present

## 2016-11-26 DIAGNOSIS — K7290 Hepatic failure, unspecified without coma: Secondary | ICD-10-CM | POA: Diagnosis not present

## 2016-11-26 DIAGNOSIS — F339 Major depressive disorder, recurrent, unspecified: Secondary | ICD-10-CM | POA: Diagnosis not present

## 2016-11-26 DIAGNOSIS — K7011 Alcoholic hepatitis with ascites: Secondary | ICD-10-CM | POA: Diagnosis not present

## 2016-11-26 DIAGNOSIS — R45851 Suicidal ideations: Secondary | ICD-10-CM | POA: Diagnosis not present

## 2016-11-26 DIAGNOSIS — K659 Peritonitis, unspecified: Secondary | ICD-10-CM | POA: Diagnosis not present

## 2016-11-26 DIAGNOSIS — F329 Major depressive disorder, single episode, unspecified: Secondary | ICD-10-CM | POA: Diagnosis not present

## 2016-11-26 DIAGNOSIS — F10129 Alcohol abuse with intoxication, unspecified: Secondary | ICD-10-CM | POA: Diagnosis not present

## 2016-11-26 DIAGNOSIS — K7031 Alcoholic cirrhosis of liver with ascites: Secondary | ICD-10-CM | POA: Diagnosis not present

## 2016-11-26 MED ORDER — ALBUMIN HUMAN 25 % IV SOLN
INTRAVENOUS | Status: AC
  Administered 2016-11-26: 25 g
  Filled 2016-11-26: qty 100

## 2016-11-26 NOTE — Procedures (Signed)
PROCEDURE SUMMARY:  Successful US guided paracentesis from RLQ.  Yielded 5.8 L of clear yellow fluid.  No immediate complications.  Pt tolerated well.   Specimen was not sent for labs.  Brayton ElBRUNING, Shareta Fishbaugh PA-C 11/26/2016 2:41 PM

## 2016-11-30 ENCOUNTER — Other Ambulatory Visit: Payer: Self-pay

## 2016-11-30 NOTE — Patient Outreach (Signed)
[facility]) Care Management  11/30/2016  Sandria BalesDavid H Kats 12/25/1945 [id number]  Transition of care  Referral date: 11/30/16 Referral source: discharged from an inpatient admission from [facility] on 11/27/16. Insurance: [employer] Medicare Attempt #1  Telephone call to patient for transition of care outreach. Unable to reach patient. HIPAA compliant voice message left with call back phone number.   PLAN: RNCM will attempt 2nd telephone call to patient within 3 business days.   George InaDavina Germaine Shenker RN,BSN,CCM [facility] Telephonic  [phone]

## 2016-12-01 ENCOUNTER — Other Ambulatory Visit: Payer: Self-pay

## 2016-12-01 DIAGNOSIS — K7031 Alcoholic cirrhosis of liver with ascites: Secondary | ICD-10-CM | POA: Diagnosis not present

## 2016-12-01 DIAGNOSIS — F329 Major depressive disorder, single episode, unspecified: Secondary | ICD-10-CM | POA: Diagnosis not present

## 2016-12-01 DIAGNOSIS — G4733 Obstructive sleep apnea (adult) (pediatric): Secondary | ICD-10-CM | POA: Diagnosis not present

## 2016-12-01 LAB — HM HEPATITIS C SCREENING LAB: HM HEPATITIS C SCREENING: NEGATIVE

## 2016-12-01 NOTE — Patient Outreach (Signed)
[facility]) Care Management  12/01/2016  Lawrence BalesDavid H West 05/24/1945 [id number]  Transition of care  Referral date: 11/30/16 Referral source: discharged from an inpatient admission from [facility] on 11/27/16. Insurance: [employer] Medicare Attempt # 2  Telephone call to patient for transition of care outreach. HIPAA verified with patient. Patient states he is at a specialist appointment at this time.  Patient request call back at another time.   PLAN: RNCM will attempt 3rd telephone call to patient within 3 business days.   George InaDavina Viggo Perko RN,BSN,CCM [facility] Telephonic  [phone]

## 2016-12-02 ENCOUNTER — Other Ambulatory Visit: Payer: Self-pay | Admitting: Unknown Physician Specialty

## 2016-12-02 DIAGNOSIS — K7031 Alcoholic cirrhosis of liver with ascites: Secondary | ICD-10-CM

## 2016-12-03 ENCOUNTER — Other Ambulatory Visit: Payer: Self-pay

## 2016-12-03 ENCOUNTER — Ambulatory Visit
Admission: RE | Admit: 2016-12-03 | Discharge: 2016-12-03 | Disposition: A | Discharge status: Home / Self Care | Source: Ambulatory Visit | Attending: Family Medicine | Admitting: Family Medicine

## 2016-12-03 DIAGNOSIS — R188 Other ascites: Secondary | ICD-10-CM | POA: Diagnosis not present

## 2016-12-03 DIAGNOSIS — K7031 Alcoholic cirrhosis of liver with ascites: Secondary | ICD-10-CM

## 2016-12-03 MED ORDER — ALBUMIN HUMAN 25 % IV SOLN
INTRAVENOUS | Status: AC
  Administered 2016-12-03: 14:00:00
  Filled 2016-12-03: qty 100

## 2016-12-03 NOTE — Procedures (Signed)
US guided paracentesis.  Removed 6.4 liters of yellow fluid.  Minimal blood loss and no immediate complication.

## 2016-12-03 NOTE — Patient Outreach (Signed)
[facility]) Care Management  12/03/2016  Lawrence BalesDavid H West 10/23/1945 [id number]   Transition of care  Referral date: 11/30/16 Referral source: discharged from an inpatient admission from [facility] on 11/27/16. Insurance: [employer] Medicare Attempt # 2  Telephone call to patient for transition of care outreach. HIPAA verified with patient. Patient states he is at a specialist appointment at this time.  Patient request call back at another time.   PLAN: RNCM will attempt 3rd telephone call to patient within 3 business days.   George InaDavina Kiriana Worthington RN,BSN,CCM [facility] Telephonic  [phone]

## 2016-12-09 ENCOUNTER — Other Ambulatory Visit: Payer: Self-pay | Admitting: Unknown Physician Specialty

## 2016-12-09 DIAGNOSIS — K7031 Alcoholic cirrhosis of liver with ascites: Secondary | ICD-10-CM

## 2016-12-10 ENCOUNTER — Ambulatory Visit
Admission: RE | Admit: 2016-12-10 | Discharge: 2016-12-10 | Disposition: A | Discharge status: Home / Self Care | Source: Ambulatory Visit | Attending: Unknown Physician Specialty | Admitting: Unknown Physician Specialty

## 2016-12-10 DIAGNOSIS — K7031 Alcoholic cirrhosis of liver with ascites: Secondary | ICD-10-CM

## 2016-12-10 DIAGNOSIS — R140 Abdominal distension (gaseous): Secondary | ICD-10-CM | POA: Insufficient documentation

## 2016-12-10 DIAGNOSIS — R188 Other ascites: Secondary | ICD-10-CM | POA: Diagnosis not present

## 2016-12-10 MED ORDER — ALBUMIN HUMAN 25 % IV SOLN
INTRAVENOUS | Status: AC
  Administered 2016-12-10: 25 g
  Filled 2016-12-10: qty 100

## 2016-12-10 MED ORDER — ALBUMIN HUMAN 25 % IV SOLN
25.0000 g | Freq: Once | INTRAVENOUS | Status: DC
Start: 2016-12-10 — End: 2016-12-11

## 2016-12-14 NOTE — Discharge Instructions (Signed)
Paracentesis, Care After °Refer to this sheet in the next few weeks. These instructions provide you with information about caring for yourself after your procedure. Your health care provider may also give you more specific instructions. Your treatment has been planned according to current medical practices, but problems sometimes occur. Call your health care provider if you have any problems or questions after your procedure. °What can I expect after the procedure? °After your procedure, it is common to have a small amount of clear fluid coming from the puncture site. °Follow these instructions at home: °· Return to your normal activities as told by your health care provider. Ask your health care provider what activities are safe for you. °· Take over-the-counter and prescription medicines only as told by your health care provider. °· Do not take baths, swim, or use a hot tub until your health care provider approves. °· Follow instructions from your health care provider about: °? How to take care of your puncture site. °? When and how you should change your bandage (dressing). °? When you should remove your dressing. °· Check your puncture area every day signs of infection. Watch for: °? Redness, swelling, or pain. °? Fluid, blood, or pus. °· Keep all follow-up visits as told by your health care provider. This is important. °Contact a health care provider if: °· You have redness, swelling, or pain at your puncture site. °· You start to have more clear fluid coming from your puncture site. °· You have blood or pus coming from your puncture site. °· You have chills. °· You have a fever. °Get help right away if: °· You develop chest pain or shortness of breath. °· You develop increasing pain, discomfort, or swelling in your abdomen. °· You feel dizzy or light-headed or you pass out. °This information is not intended to replace advice given to you by your health care provider. Make sure you discuss any questions you  have with your health care provider. °Document Released: 06/11/2014 Document Revised: 07/03/2015 Document Reviewed: 04/09/2014 °Elsevier Interactive Patient Education © 2018 [employer] ° °

## 2016-12-17 ENCOUNTER — Other Ambulatory Visit: Payer: Self-pay

## 2016-12-17 ENCOUNTER — Ambulatory Visit
Admission: RE | Admit: 2016-12-17 | Discharge: 2016-12-17 | Disposition: A | Discharge status: Home / Self Care | Source: Ambulatory Visit | Attending: Family Medicine | Admitting: Family Medicine

## 2016-12-17 DIAGNOSIS — K7031 Alcoholic cirrhosis of liver with ascites: Secondary | ICD-10-CM | POA: Insufficient documentation

## 2016-12-17 DIAGNOSIS — R188 Other ascites: Secondary | ICD-10-CM | POA: Diagnosis not present

## 2016-12-17 MED ORDER — ALBUMIN HUMAN 25 % IV SOLN
25.0000 g | Freq: Once | INTRAVENOUS | Status: AC
  Administered 2016-12-17: 25 g via INTRAVENOUS

## 2016-12-17 NOTE — Patient Outreach (Signed)
[facility]) Care Management  12/17/2016  Lawrence BalesDavid H West 01/26/1946 [id number]   No response from patient after 3 telephone calls and outreach letter attempt.  PLAN; RNCM will refer patient to care management assistant to close patient due to being unable to reach.  RNCM will notify patients primary MD of closure.   Lawrence InaDavina Tadarius Maland RN,BSN,CCM [facility] Telephonic  [phone]

## 2016-12-17 NOTE — Procedures (Signed)
Ultrasound-guided therapeutic paracentesis performed yielding 6.7  liters of yellow fluid. No immediate complications.The pt will receive IV albumin postprocedure.

## 2016-12-20 NOTE — Discharge Instructions (Signed)
Paracentesis, Care After °Refer to this sheet in the next few weeks. These instructions provide you with information about caring for yourself after your procedure. Your health care provider may also give you more specific instructions. Your treatment has been planned according to current medical practices, but problems sometimes occur. Call your health care provider if you have any problems or questions after your procedure. °What can I expect after the procedure? °After your procedure, it is common to have a small amount of clear fluid coming from the puncture site. °Follow these instructions at home: °· Return to your normal activities as told by your health care provider. Ask your health care provider what activities are safe for you. °· Take over-the-counter and prescription medicines only as told by your health care provider. °· Do not take baths, swim, or use a hot tub until your health care provider approves. °· Follow instructions from your health care provider about: °? How to take care of your puncture site. °? When and how you should change your bandage (dressing). °? When you should remove your dressing. °· Check your puncture area every day signs of infection. Watch for: °? Redness, swelling, or pain. °? Fluid, blood, or pus. °· Keep all follow-up visits as told by your health care provider. This is important. °Contact a health care provider if: °· You have redness, swelling, or pain at your puncture site. °· You start to have more clear fluid coming from your puncture site. °· You have blood or pus coming from your puncture site. °· You have chills. °· You have a fever. °Get help right away if: °· You develop chest pain or shortness of breath. °· You develop increasing pain, discomfort, or swelling in your abdomen. °· You feel dizzy or light-headed or you pass out. °This information is not intended to replace advice given to you by your health care provider. Make sure you discuss any questions you  have with your health care provider. °Document Released: 06/11/2014 Document Revised: 07/03/2015 Document Reviewed: 04/09/2014 °Elsevier Interactive Patient Education © 2018 [employer] ° °

## 2016-12-24 ENCOUNTER — Ambulatory Visit
Admission: RE | Admit: 2016-12-24 | Discharge: 2016-12-24 | Disposition: A | Discharge status: Home / Self Care | Source: Ambulatory Visit | Attending: Family Medicine | Admitting: Family Medicine

## 2016-12-24 ENCOUNTER — Ambulatory Visit: Payer: Medicare HMO

## 2016-12-24 DIAGNOSIS — K7031 Alcoholic cirrhosis of liver with ascites: Secondary | ICD-10-CM | POA: Diagnosis not present

## 2016-12-24 DIAGNOSIS — R188 Other ascites: Secondary | ICD-10-CM | POA: Diagnosis not present

## 2016-12-24 MED ORDER — ALBUMIN HUMAN 25 % IV SOLN
INTRAVENOUS | Status: AC
  Filled 2016-12-24: qty 100

## 2016-12-24 MED ORDER — ALBUMIN HUMAN 25 % IV SOLN
12.5000 g | Freq: Once | INTRAVENOUS | Status: DC
  Filled 2016-12-24: qty 50

## 2016-12-24 MED ORDER — ALBUMIN HUMAN 25 % IV SOLN
25.0000 g | Freq: Once | INTRAVENOUS | Status: AC
  Administered 2016-12-24: 25 g via INTRAVENOUS
  Filled 2016-12-24: qty 100

## 2016-12-24 NOTE — Procedures (Signed)
Ultrasound-guided  therapeutic paracentesis performed yielding 6 liters of yellow fluid. No immediate complications.  Patient will receive IV albumin infusion postprocedure.

## 2016-12-24 NOTE — Progress Notes (Signed)
Called Pharmacy re: Albumin 25 GM will not scan appropriately. On 2nd call to Pharmacist, Pharmacist states "there is a new manufacturer that is supplying this batch of Albumin & the barcodes will not scan." Albumin double checked with Marlane HatcherJ. Dietz, RN and then hung per protocol. Pt. In No acute distress. VSS.

## 2016-12-24 NOTE — Progress Notes (Signed)
Albumin gtt. Infused. NS lock DC'd intact. VSS. Pt. Ambulated to BR with cane. Stable for DC home.

## 2016-12-31 ENCOUNTER — Ambulatory Visit
Admission: RE | Admit: 2016-12-31 | Discharge: 2016-12-31 | Disposition: A | Discharge status: Home / Self Care | Payer: Medicare Other | Source: Ambulatory Visit | Attending: Family Medicine | Admitting: Family Medicine

## 2016-12-31 DIAGNOSIS — K7031 Alcoholic cirrhosis of liver with ascites: Secondary | ICD-10-CM | POA: Insufficient documentation

## 2016-12-31 DIAGNOSIS — R188 Other ascites: Secondary | ICD-10-CM | POA: Diagnosis not present

## 2016-12-31 MED ORDER — ALBUMIN HUMAN 25 % IV SOLN
INTRAVENOUS | Status: AC
  Filled 2016-12-31: qty 100

## 2016-12-31 MED ORDER — ALBUMIN HUMAN 25 % IV SOLN: 12.5000 g | Freq: Once | INTRAVENOUS | Status: DC

## 2016-12-31 MED ORDER — ALBUMIN HUMAN 25 % IV SOLN
25.0000 g | Freq: Once | INTRAVENOUS | Status: AC
  Administered 2016-12-31: 25 g via INTRAVENOUS
  Filled 2016-12-31: qty 100

## 2016-12-31 NOTE — Procedures (Signed)
Pre Procedural Dx: Symptomatic Ascites Post Procedural Dx: Same  Successful US guided paracentesis yielding 6.3 L of serous ascitic fluid.  EBL: None  Complications: None immediate  Katherina RightJay Rache Klimaszewski, MD Pager #: [phone]

## 2017-01-05 DIAGNOSIS — R0602 Shortness of breath: Secondary | ICD-10-CM | POA: Diagnosis not present

## 2017-01-05 DIAGNOSIS — R0600 Dyspnea, unspecified: Secondary | ICD-10-CM | POA: Diagnosis not present

## 2017-01-05 DIAGNOSIS — R197 Diarrhea, unspecified: Secondary | ICD-10-CM | POA: Diagnosis not present

## 2017-01-07 ENCOUNTER — Ambulatory Visit
Admission: RE | Admit: 2017-01-07 | Discharge: 2017-01-07 | Disposition: A | Discharge status: Home / Self Care | Source: Ambulatory Visit | Attending: Family Medicine | Admitting: Family Medicine

## 2017-01-07 DIAGNOSIS — K7031 Alcoholic cirrhosis of liver with ascites: Secondary | ICD-10-CM | POA: Diagnosis not present

## 2017-01-07 MED ORDER — ALBUMIN HUMAN 25 % IV SOLN
INTRAVENOUS | Status: DC
Start: 2017-01-07 — End: 2017-01-08
  Filled 2017-01-07: qty 100

## 2017-01-07 MED ORDER — ALBUMIN HUMAN 25 % IV SOLN
25.0000 g | Freq: Once | INTRAVENOUS | Status: AC
  Administered 2017-01-07: 25 g via INTRAVENOUS
  Filled 2017-01-07: qty 100

## 2017-01-07 NOTE — Procedures (Signed)
PROCEDURE SUMMARY:  Successful US guided paracentesis from left lateral abdomen.  Yielded 8.0 liters of clear, yellow fluid.  No immediate complications.  Pt tolerated well.   Specimen was not sent for labs.  Hoyt KochKacie Sue-Ellen Jaselyn Nahm PA-C 01/07/2017 3:08 PM

## 2017-01-10 ENCOUNTER — Other Ambulatory Visit
Admission: RE | Admit: 2017-01-10 | Discharge: 2017-01-10 | Disposition: A | Discharge status: Home / Self Care | Source: Ambulatory Visit | Attending: Unknown Physician Specialty | Admitting: Unknown Physician Specialty

## 2017-01-10 DIAGNOSIS — M10072 Idiopathic gout, left ankle and foot: Secondary | ICD-10-CM | POA: Diagnosis not present

## 2017-01-10 DIAGNOSIS — E7800 Pure hypercholesterolemia, unspecified: Secondary | ICD-10-CM | POA: Diagnosis not present

## 2017-01-10 DIAGNOSIS — K7031 Alcoholic cirrhosis of liver with ascites: Secondary | ICD-10-CM | POA: Diagnosis not present

## 2017-01-10 DIAGNOSIS — R197 Diarrhea, unspecified: Secondary | ICD-10-CM | POA: Insufficient documentation

## 2017-01-10 DIAGNOSIS — Z79899 Other long term (current) drug therapy: Secondary | ICD-10-CM | POA: Diagnosis not present

## 2017-01-10 DIAGNOSIS — I10 Essential (primary) hypertension: Secondary | ICD-10-CM | POA: Diagnosis not present

## 2017-01-10 LAB — GASTROINTESTINAL PANEL BY PCR, STOOL (REPLACES STOOL CULTURE)

## 2017-01-11 ENCOUNTER — Other Ambulatory Visit: Payer: Self-pay | Admitting: Unknown Physician Specialty

## 2017-01-11 DIAGNOSIS — K7031 Alcoholic cirrhosis of liver with ascites: Secondary | ICD-10-CM

## 2017-01-11 LAB — C DIFFICILE QUICK SCREEN W PCR REFLEX
C DIFFICILE (CDIFF) INTERP: DETECTED
C Diff antigen: POSITIVE — AB
C Diff toxin: POSITIVE — AB

## 2017-01-11 NOTE — Discharge Instructions (Signed)
Paracentesis, Care After °Refer to this sheet in the next few weeks. These instructions provide you with information about caring for yourself after your procedure. Your health care provider may also give you more specific instructions. Your treatment has been planned according to current medical practices, but problems sometimes occur. Call your health care provider if you have any problems or questions after your procedure. °What can I expect after the procedure? °After your procedure, it is common to have a small amount of clear fluid coming from the puncture site. °Follow these instructions at home: °· Return to your normal activities as told by your health care provider. Ask your health care provider what activities are safe for you. °· Take over-the-counter and prescription medicines only as told by your health care provider. °· Do not take baths, swim, or use a hot tub until your health care provider approves. °· Follow instructions from your health care provider about: °? How to take care of your puncture site. °? When and how you should change your bandage (dressing). °? When you should remove your dressing. °· Check your puncture area every day signs of infection. Watch for: °? Redness, swelling, or pain. °? Fluid, blood, or pus. °· Keep all follow-up visits as told by your health care provider. This is important. °Contact a health care provider if: °· You have redness, swelling, or pain at your puncture site. °· You start to have more clear fluid coming from your puncture site. °· You have blood or pus coming from your puncture site. °· You have chills. °· You have a fever. °Get help right away if: °· You develop chest pain or shortness of breath. °· You develop increasing pain, discomfort, or swelling in your abdomen. °· You feel dizzy or light-headed or you pass out. °This information is not intended to replace advice given to you by your health care provider. Make sure you discuss any questions you  have with your health care provider. °Document Released: 06/11/2014 Document Revised: 07/03/2015 Document Reviewed: 04/09/2014 °Elsevier Interactive Patient Education © 2018 [employer] ° °

## 2017-01-14 ENCOUNTER — Ambulatory Visit
Admission: RE | Admit: 2017-01-14 | Discharge: 2017-01-14 | Disposition: A | Discharge status: Home / Self Care | Source: Ambulatory Visit | Attending: Unknown Physician Specialty | Admitting: Unknown Physician Specialty

## 2017-01-14 DIAGNOSIS — K7031 Alcoholic cirrhosis of liver with ascites: Secondary | ICD-10-CM

## 2017-01-14 DIAGNOSIS — R188 Other ascites: Secondary | ICD-10-CM | POA: Diagnosis not present

## 2017-01-14 MED ORDER — ALBUMIN HUMAN 25 % IV SOLN
25.0000 g | Freq: Once | INTRAVENOUS | Status: DC
  Filled 2017-01-14: qty 100

## 2017-01-14 MED ORDER — ALBUMIN HUMAN 25 % IV SOLN
INTRAVENOUS | Status: AC
  Administered 2017-01-14: 14:00:00
  Filled 2017-01-14: qty 100

## 2017-01-14 NOTE — Procedures (Addendum)
Ultrasound-guided therapeutic paracentesis performed yielding 4.2 liters of clear, yellow fluid. No immediate complications.The pt will receive IV albumin peri/postprocedure.

## 2017-01-21 ENCOUNTER — Ambulatory Visit
Admission: RE | Admit: 2017-01-21 | Discharge: 2017-01-21 | Disposition: A | Discharge status: Home / Self Care | Source: Ambulatory Visit | Attending: Unknown Physician Specialty | Admitting: Unknown Physician Specialty

## 2017-01-21 DIAGNOSIS — K7031 Alcoholic cirrhosis of liver with ascites: Secondary | ICD-10-CM | POA: Diagnosis not present

## 2017-01-21 DIAGNOSIS — R188 Other ascites: Secondary | ICD-10-CM | POA: Diagnosis not present

## 2017-01-21 MED ORDER — ALBUMIN HUMAN 25 % IV SOLN
INTRAVENOUS | Status: AC
  Administered 2017-01-21: 25 g via INTRAVENOUS
  Filled 2017-01-21: qty 100

## 2017-01-21 MED ORDER — ALBUMIN HUMAN 25 % IV SOLN
25.0000 g | Freq: Once | INTRAVENOUS | Status: AC
  Administered 2017-01-21: 25 g via INTRAVENOUS
  Filled 2017-01-21: qty 100

## 2017-01-25 NOTE — Discharge Instructions (Signed)
Paracentesis, Care After °Refer to this sheet in the next few weeks. These instructions provide you with information about caring for yourself after your procedure. Your health care provider may also give you more specific instructions. Your treatment has been planned according to current medical practices, but problems sometimes occur. Call your health care provider if you have any problems or questions after your procedure. °What can I expect after the procedure? °After your procedure, it is common to have a small amount of clear fluid coming from the puncture site. °Follow these instructions at home: °· Return to your normal activities as told by your health care provider. Ask your health care provider what activities are safe for you. °· Take over-the-counter and prescription medicines only as told by your health care provider. °· Do not take baths, swim, or use a hot tub until your health care provider approves. °· Follow instructions from your health care provider about: °? How to take care of your puncture site. °? When and how you should change your bandage (dressing). °? When you should remove your dressing. °· Check your puncture area every day signs of infection. Watch for: °? Redness, swelling, or pain. °? Fluid, blood, or pus. °· Keep all follow-up visits as told by your health care provider. This is important. °Contact a health care provider if: °· You have redness, swelling, or pain at your puncture site. °· You start to have more clear fluid coming from your puncture site. °· You have blood or pus coming from your puncture site. °· You have chills. °· You have a fever. °Get help right away if: °· You develop chest pain or shortness of breath. °· You develop increasing pain, discomfort, or swelling in your abdomen. °· You feel dizzy or light-headed or you pass out. °This information is not intended to replace advice given to you by your health care provider. Make sure you discuss any questions you  have with your health care provider. °Document Released: 06/11/2014 Document Revised: 07/03/2015 Document Reviewed: 04/09/2014 °Elsevier Interactive Patient Education © 2018 [employer] ° °

## 2017-01-28 ENCOUNTER — Ambulatory Visit
Admission: RE | Admit: 2017-01-28 | Discharge: 2017-01-28 | Disposition: A | Discharge status: Home / Self Care | Source: Ambulatory Visit | Attending: Unknown Physician Specialty | Admitting: Unknown Physician Specialty

## 2017-01-28 DIAGNOSIS — K7031 Alcoholic cirrhosis of liver with ascites: Secondary | ICD-10-CM | POA: Diagnosis not present

## 2017-01-28 MED ORDER — ALBUMIN HUMAN 25 % IV SOLN
INTRAVENOUS | Status: AC
  Filled 2017-01-28: qty 100

## 2017-01-28 MED ORDER — ALBUMIN HUMAN 25 % IV SOLN
25.0000 g | Freq: Once | INTRAVENOUS | Status: AC
  Administered 2017-01-28: 14:00:00 via INTRAVENOUS
  Filled 2017-01-28: qty 100

## 2017-01-31 ENCOUNTER — Emergency Department: Payer: Medicare Other

## 2017-01-31 ENCOUNTER — Emergency Department
Admission: EM | Admit: 2017-01-31 | Discharge: 2017-01-31 | Payer: Medicare Other | Attending: Emergency Medicine | Admitting: Emergency Medicine

## 2017-01-31 ENCOUNTER — Encounter: Payer: Self-pay | Admitting: Emergency Medicine

## 2017-01-31 DIAGNOSIS — R4182 Altered mental status, unspecified: Secondary | ICD-10-CM | POA: Diagnosis not present

## 2017-01-31 DIAGNOSIS — Y33XXXA Other specified events, undetermined intent, initial encounter: Secondary | ICD-10-CM | POA: Diagnosis not present

## 2017-01-31 DIAGNOSIS — J90 Pleural effusion, not elsewhere classified: Secondary | ICD-10-CM | POA: Diagnosis not present

## 2017-01-31 DIAGNOSIS — J9811 Atelectasis: Secondary | ICD-10-CM | POA: Diagnosis not present

## 2017-01-31 DIAGNOSIS — I10 Essential (primary) hypertension: Secondary | ICD-10-CM | POA: Insufficient documentation

## 2017-01-31 DIAGNOSIS — K7040 Alcoholic hepatic failure without coma: Secondary | ICD-10-CM | POA: Diagnosis not present

## 2017-01-31 DIAGNOSIS — R55 Syncope and collapse: Secondary | ICD-10-CM | POA: Diagnosis not present

## 2017-01-31 DIAGNOSIS — R488 Other symbolic dysfunctions: Secondary | ICD-10-CM | POA: Diagnosis not present

## 2017-01-31 DIAGNOSIS — K7031 Alcoholic cirrhosis of liver with ascites: Secondary | ICD-10-CM | POA: Diagnosis not present

## 2017-01-31 DIAGNOSIS — Z96642 Presence of left artificial hip joint: Secondary | ICD-10-CM | POA: Diagnosis not present

## 2017-01-31 DIAGNOSIS — T797XXA Traumatic subcutaneous emphysema, initial encounter: Secondary | ICD-10-CM | POA: Diagnosis not present

## 2017-01-31 DIAGNOSIS — S0990XA Unspecified injury of head, initial encounter: Secondary | ICD-10-CM | POA: Diagnosis not present

## 2017-01-31 DIAGNOSIS — Y929 Unspecified place or not applicable: Secondary | ICD-10-CM | POA: Insufficient documentation

## 2017-01-31 DIAGNOSIS — A0472 Enterocolitis due to Clostridium difficile, not specified as recurrent: Secondary | ICD-10-CM | POA: Diagnosis not present

## 2017-01-31 DIAGNOSIS — S2242XD Multiple fractures of ribs, left side, subsequent encounter for fracture with routine healing: Secondary | ICD-10-CM | POA: Diagnosis not present

## 2017-01-31 DIAGNOSIS — M1990 Unspecified osteoarthritis, unspecified site: Secondary | ICD-10-CM | POA: Diagnosis not present

## 2017-01-31 DIAGNOSIS — R2689 Other abnormalities of gait and mobility: Secondary | ICD-10-CM | POA: Diagnosis not present

## 2017-01-31 DIAGNOSIS — S3991XA Unspecified injury of abdomen, initial encounter: Secondary | ICD-10-CM | POA: Diagnosis not present

## 2017-01-31 DIAGNOSIS — A047 Enterocolitis due to Clostridium difficile: Secondary | ICD-10-CM | POA: Diagnosis not present

## 2017-01-31 DIAGNOSIS — J939 Pneumothorax, unspecified: Secondary | ICD-10-CM | POA: Diagnosis not present

## 2017-01-31 DIAGNOSIS — J982 Interstitial emphysema: Secondary | ICD-10-CM | POA: Diagnosis not present

## 2017-01-31 DIAGNOSIS — I8500 Esophageal varices without bleeding: Secondary | ICD-10-CM | POA: Diagnosis not present

## 2017-01-31 DIAGNOSIS — R1010 Upper abdominal pain, unspecified: Secondary | ICD-10-CM | POA: Diagnosis not present

## 2017-01-31 DIAGNOSIS — K7210 Chronic hepatic failure without coma: Secondary | ICD-10-CM | POA: Diagnosis not present

## 2017-01-31 DIAGNOSIS — W1839XA Other fall on same level, initial encounter: Secondary | ICD-10-CM | POA: Diagnosis not present

## 2017-01-31 DIAGNOSIS — S199XXA Unspecified injury of neck, initial encounter: Secondary | ICD-10-CM | POA: Diagnosis not present

## 2017-01-31 DIAGNOSIS — K766 Portal hypertension: Secondary | ICD-10-CM | POA: Diagnosis not present

## 2017-01-31 DIAGNOSIS — E875 Hyperkalemia: Secondary | ICD-10-CM | POA: Diagnosis not present

## 2017-01-31 DIAGNOSIS — W19XXXA Unspecified fall, initial encounter: Secondary | ICD-10-CM

## 2017-01-31 DIAGNOSIS — M6281 Muscle weakness (generalized): Secondary | ICD-10-CM | POA: Diagnosis not present

## 2017-01-31 DIAGNOSIS — Y939 Activity, unspecified: Secondary | ICD-10-CM | POA: Diagnosis not present

## 2017-01-31 DIAGNOSIS — Z79899 Other long term (current) drug therapy: Secondary | ICD-10-CM | POA: Insufficient documentation

## 2017-01-31 DIAGNOSIS — E7220 Disorder of urea cycle metabolism, unspecified: Secondary | ICD-10-CM

## 2017-01-31 DIAGNOSIS — R0602 Shortness of breath: Secondary | ICD-10-CM | POA: Diagnosis present

## 2017-01-31 DIAGNOSIS — S2242XA Multiple fractures of ribs, left side, initial encounter for closed fracture: Secondary | ICD-10-CM | POA: Diagnosis not present

## 2017-01-31 DIAGNOSIS — E872 Acidosis: Secondary | ICD-10-CM | POA: Diagnosis not present

## 2017-01-31 DIAGNOSIS — N179 Acute kidney failure, unspecified: Secondary | ICD-10-CM | POA: Diagnosis not present

## 2017-01-31 DIAGNOSIS — N186 End stage renal disease: Secondary | ICD-10-CM | POA: Diagnosis not present

## 2017-01-31 DIAGNOSIS — Y999 Unspecified external cause status: Secondary | ICD-10-CM | POA: Insufficient documentation

## 2017-01-31 DIAGNOSIS — K7460 Unspecified cirrhosis of liver: Secondary | ICD-10-CM | POA: Diagnosis not present

## 2017-01-31 DIAGNOSIS — F329 Major depressive disorder, single episode, unspecified: Secondary | ICD-10-CM | POA: Diagnosis not present

## 2017-01-31 DIAGNOSIS — W108XXA Fall (on) (from) other stairs and steps, initial encounter: Secondary | ICD-10-CM | POA: Diagnosis not present

## 2017-01-31 DIAGNOSIS — E871 Hypo-osmolality and hyponatremia: Secondary | ICD-10-CM | POA: Diagnosis not present

## 2017-01-31 DIAGNOSIS — S20212A Contusion of left front wall of thorax, initial encounter: Secondary | ICD-10-CM

## 2017-01-31 DIAGNOSIS — J9621 Acute and chronic respiratory failure with hypoxia: Secondary | ICD-10-CM | POA: Diagnosis not present

## 2017-01-31 DIAGNOSIS — K7030 Alcoholic cirrhosis of liver without ascites: Secondary | ICD-10-CM | POA: Diagnosis not present

## 2017-01-31 DIAGNOSIS — S2190XA Unspecified open wound of unspecified part of thorax, initial encounter: Secondary | ICD-10-CM | POA: Diagnosis not present

## 2017-01-31 DIAGNOSIS — S270XXA Traumatic pneumothorax, initial encounter: Secondary | ICD-10-CM | POA: Diagnosis not present

## 2017-01-31 DIAGNOSIS — Z515 Encounter for palliative care: Secondary | ICD-10-CM | POA: Diagnosis not present

## 2017-01-31 DIAGNOSIS — J439 Emphysema, unspecified: Secondary | ICD-10-CM | POA: Diagnosis not present

## 2017-01-31 DIAGNOSIS — Z9981 Dependence on supplemental oxygen: Secondary | ICD-10-CM | POA: Diagnosis not present

## 2017-01-31 DIAGNOSIS — S2243XA Multiple fractures of ribs, bilateral, initial encounter for closed fracture: Secondary | ICD-10-CM | POA: Diagnosis not present

## 2017-01-31 DIAGNOSIS — R188 Other ascites: Secondary | ICD-10-CM | POA: Diagnosis not present

## 2017-01-31 DIAGNOSIS — J9601 Acute respiratory failure with hypoxia: Secondary | ICD-10-CM | POA: Diagnosis not present

## 2017-01-31 DIAGNOSIS — Z9181 History of falling: Secondary | ICD-10-CM | POA: Diagnosis not present

## 2017-01-31 DIAGNOSIS — J9383 Other pneumothorax: Secondary | ICD-10-CM | POA: Diagnosis not present

## 2017-01-31 DIAGNOSIS — R531 Weakness: Secondary | ICD-10-CM | POA: Diagnosis not present

## 2017-01-31 DIAGNOSIS — S272XXA Traumatic hemopneumothorax, initial encounter: Secondary | ICD-10-CM | POA: Diagnosis not present

## 2017-01-31 DIAGNOSIS — K7290 Hepatic failure, unspecified without coma: Secondary | ICD-10-CM | POA: Diagnosis not present

## 2017-01-31 DIAGNOSIS — Z66 Do not resuscitate: Secondary | ICD-10-CM | POA: Diagnosis not present

## 2017-01-31 DIAGNOSIS — R5381 Other malaise: Secondary | ICD-10-CM | POA: Diagnosis not present

## 2017-01-31 DIAGNOSIS — S3993XA Unspecified injury of pelvis, initial encounter: Secondary | ICD-10-CM | POA: Diagnosis not present

## 2017-01-31 DIAGNOSIS — S3992XA Unspecified injury of lower back, initial encounter: Secondary | ICD-10-CM | POA: Diagnosis not present

## 2017-01-31 DIAGNOSIS — S299XXA Unspecified injury of thorax, initial encounter: Secondary | ICD-10-CM | POA: Diagnosis not present

## 2017-01-31 DIAGNOSIS — R918 Other nonspecific abnormal finding of lung field: Secondary | ICD-10-CM | POA: Diagnosis not present

## 2017-01-31 DIAGNOSIS — F10929 Alcohol use, unspecified with intoxication, unspecified: Secondary | ICD-10-CM

## 2017-01-31 DIAGNOSIS — R9431 Abnormal electrocardiogram [ECG] [EKG]: Secondary | ICD-10-CM | POA: Diagnosis not present

## 2017-01-31 DIAGNOSIS — A0471 Enterocolitis due to Clostridium difficile, recurrent: Secondary | ICD-10-CM | POA: Diagnosis not present

## 2017-01-31 DIAGNOSIS — I440 Atrioventricular block, first degree: Secondary | ICD-10-CM | POA: Diagnosis not present

## 2017-01-31 LAB — COMPREHENSIVE METABOLIC PANEL
ALBUMIN: 3.2 g/dL — AB (ref 3.5–5.0)
ALT: 25 U/L (ref 17–63)
AST: 57 U/L — AB (ref 15–41)
Alkaline Phosphatase: 71 U/L (ref 38–126)
Anion gap: 7 (ref 5–15)
BILIRUBIN TOTAL: 4.1 mg/dL — AB (ref 0.3–1.2)
BUN: 12 mg/dL (ref 6–20)
CALCIUM: 8.1 mg/dL — AB (ref 8.9–10.3)
CO2: 23 mmol/L (ref 22–32)
Chloride: 101 mmol/L (ref 101–111)
Creatinine, Ser: 1.11 mg/dL (ref 0.61–1.24)
GFR calc Af Amer: >60 mL/min (ref >60)
Glucose, Bld: 135 mg/dL — ABNORMAL HIGH (ref 65–99)
Potassium: 4 mmol/L (ref 3.5–5.1)
Sodium: 131 mmol/L — ABNORMAL LOW (ref 135–145)
Total Protein: 6.6 g/dL (ref 6.5–8.1)

## 2017-01-31 LAB — MAGNESIUM: Magnesium: 2.2 mg/dL (ref 1.7–2.4)

## 2017-01-31 LAB — APTT: aPTT: 38 seconds — ABNORMAL HIGH (ref 24–36)

## 2017-01-31 LAB — PROTIME-INR
INR: 1.32
PROTHROMBIN TIME: 16.3 s — AB (ref 11.4–15.2)

## 2017-01-31 LAB — CBC WITH DIFFERENTIAL/PLATELET
BASOS ABS: 0 10*3/uL (ref 0–0.1)
BASOS PCT: 0 %
Eosinophils Absolute: 0.2 10*3/uL (ref 0–0.7)
Eosinophils Relative: 2 %
HEMATOCRIT: 39.3 % — AB (ref 40.0–52.0)
HEMOGLOBIN: 13.5 g/dL (ref 13.0–18.0)
LYMPHS PCT: 8 %
Lymphs Abs: 0.9 10*3/uL — ABNORMAL LOW (ref 1.0–3.6)
MCH: 33.5 pg (ref 26.0–34.0)
MCHC: 34.3 g/dL (ref 32.0–36.0)
MCV: 97.7 fL (ref 80.0–100.0)
Monocytes Absolute: 1 10*3/uL (ref 0.2–1.0)
Monocytes Relative: 9 %
NEUTROS ABS: 9 10*3/uL — AB (ref 1.4–6.5)
NEUTROS PCT: 81 %
Platelets: 101 10*3/uL — ABNORMAL LOW (ref 150–440)
RBC: 4.02 MIL/uL — AB (ref 4.40–5.90)
RDW: 16.9 % — ABNORMAL HIGH (ref 11.5–14.5)
WBC: 11.2 10*3/uL — AB (ref 3.8–10.6)

## 2017-01-31 LAB — AMMONIA: AMMONIA: 63 umol/L — AB (ref 9–35)

## 2017-01-31 LAB — TROPONIN I: Troponin I: <0.03 ng/mL (ref <0.03)

## 2017-01-31 LAB — ETHANOL: ALCOHOL ETHYL (B): 233 mg/dL — AB (ref <10)

## 2017-01-31 LAB — LIPASE, BLOOD: Lipase: 31 U/L (ref 11–51)

## 2017-01-31 MED ORDER — NALOXONE HCL 0.4 MG/ML IJ SOLN
0.40 | INTRAMUSCULAR | Status: DC
Start: ? — End: 2017-01-31

## 2017-01-31 MED ORDER — LAMOTRIGINE 25 MG PO TABS
50.00 | ORAL_TABLET | ORAL | Status: DC
Start: 2017-02-15 — End: 2017-01-31

## 2017-01-31 MED ORDER — MORPHINE SULFATE (PF) 4 MG/ML IV SOLN
4.0000 mg | Freq: Once | INTRAVENOUS | Status: AC
  Administered 2017-01-31: 4 mg via INTRAVENOUS
  Filled 2017-01-31: qty 1

## 2017-01-31 MED ORDER — FENTANYL CITRATE (PF) 2500 MCG/50ML IJ SOLN
25.00 | INTRAMUSCULAR | Status: DC
Start: ? — End: 2017-01-31

## 2017-01-31 MED ORDER — ESCITALOPRAM OXALATE 10 MG PO TABS
10.00 | ORAL_TABLET | ORAL | Status: DC
Start: 2017-02-15 — End: 2017-01-31

## 2017-01-31 MED ORDER — GENERIC EXTERNAL MEDICATION
Status: DC
Start: ? — End: 2017-01-31

## 2017-01-31 MED ORDER — LACTULOSE 10 GM/15ML PO SOLN
30.00 | ORAL | Status: DC
Start: 2017-02-01 — End: 2017-01-31

## 2017-01-31 MED ORDER — ONDANSETRON HCL 4 MG/2ML IJ SOLN
4.0000 mg | INTRAMUSCULAR | Status: AC
  Administered 2017-01-31: 4 mg via INTRAVENOUS
  Filled 2017-01-31: qty 2

## 2017-01-31 MED ORDER — KETAMINE HCL 10 MG/ML IJ SOLN
INTRAMUSCULAR | Status: AC
  Filled 2017-01-31: qty 1

## 2017-01-31 MED ORDER — SODIUM CHLORIDE 0.9 % IJ SOLN
5.00 | INTRAMUSCULAR | Status: DC
Start: 2017-02-14 — End: 2017-01-31

## 2017-01-31 MED ORDER — ONDANSETRON HCL 4 MG/2ML IJ SOLN
4.00 | INTRAMUSCULAR | Status: DC
Start: ? — End: 2017-01-31

## 2017-01-31 MED ORDER — FENTANYL CITRATE (PF) 100 MCG/2ML IJ SOLN
50.0000 ug | Freq: Once | INTRAMUSCULAR | Status: AC
  Administered 2017-01-31: 50 ug via INTRAVENOUS

## 2017-01-31 MED ORDER — OXYCODONE HCL 5 MG PO TABS
5.00 | ORAL_TABLET | ORAL | Status: DC
Start: ? — End: 2017-01-31

## 2017-01-31 MED ORDER — SPIRONOLACTONE 25 MG PO TABS
25.00 | ORAL_TABLET | ORAL | Status: DC
Start: 2017-02-01 — End: 2017-01-31

## 2017-01-31 MED ORDER — LACTATED RINGERS IV SOLN
INTRAVENOUS | Status: DC
Start: ? — End: 2017-01-31

## 2017-01-31 MED ORDER — ALBUMIN HUMAN 5 % IV SOLN
250.00 | INTRAVENOUS | Status: DC
Start: 2017-01-31 — End: 2017-01-31

## 2017-01-31 MED ORDER — HEPARIN SODIUM (PORCINE) 5000 UNIT/ML IJ SOLN
5000.00 | INTRAMUSCULAR | Status: DC
Start: 2017-02-14 — End: 2017-01-31

## 2017-01-31 MED ORDER — PANTOPRAZOLE SODIUM 40 MG PO TBEC
40.00 | DELAYED_RELEASE_TABLET | ORAL | Status: DC
Start: 2017-02-14 — End: 2017-01-31

## 2017-01-31 MED ORDER — FUROSEMIDE 40 MG PO TABS
20.00 | ORAL_TABLET | ORAL | Status: DC
Start: 2017-02-01 — End: 2017-01-31

## 2017-01-31 MED ORDER — RIFAXIMIN 550 MG PO TABS
550.00 | ORAL_TABLET | ORAL | Status: DC
Start: 2017-02-14 — End: 2017-01-31

## 2017-01-31 MED ORDER — FENTANYL CITRATE (PF) 100 MCG/2ML IJ SOLN
INTRAMUSCULAR | Status: AC
  Filled 2017-01-31: qty 2

## 2017-01-31 NOTE — ED Notes (Signed)
EMTALA reviewed. 

## 2017-01-31 NOTE — ED Notes (Signed)
Patient placed on 15L non-rebreather per MDVO

## 2017-01-31 NOTE — ED Notes (Signed)
Patient oxygen changed to 6L [place], he was de-satting into upper 80's with good form

## 2017-01-31 NOTE — ED Notes (Signed)
XR at bedside

## 2017-01-31 NOTE — ED Triage Notes (Signed)
Patient arrived from home via EMS with co fall pain.  Pt is alert with etoh on board according to EMS.  Pt stated he has had 3 beers today to EMS.  Pt is repeatedly saying "oh man it hurts" and has a contusion in mid back left side.  EMS said they came out earlier to help him up and he refused assistance.  He had 9.2 L of fluid removed last Friday according to ems.

## 2017-01-31 NOTE — ED Provider Notes (Signed)
[facility] Emergency Department Provider Note  ____________________________________________   First MD Initiated Contact with Patient 01/31/17 0424     (approximate)  I have reviewed the triage vital signs and the nursing notes.   HISTORY  Chief Complaint Fall  Level 5 caveat:  history/ROS limited by altered mental status/confusion, some of which is at baseline, but some is also acute   HPI Lawrence West is a 71 y.o. male with extensive chronic medical history but most notably includes chronic and persistent alcoholism and severe chronic cirrhosis requiring regular large volume paracentesis and for which he appears to be on hospice and he does have a legal guardian according to [facility].  He presents by EMS for evaluation of left flank pain after a fall several hours ago.  He admits to drinking a whole tonight and the family told EMS they believe it is far more than the 3 beers he reports.  EMS was called out about an hour prior to this call and he refused transport.  As the pain reportedly got worse he called EMS again and was transported.  He has a large contusion/hematoma and abrasion on his left flank which is causing him a great deal of pain.  This seems to be causing some mild shortness of breath as well because taking deep breaths is painful for him.  He is not able to give any additional history at this time although he is awake and alert, he is mostly moaning and stating that it hurts but does not give additional history beyond what is documented above.  Past Medical History:  Diagnosis Date  . Arthritis    "left knee" (07/08/2015)  . Ascites   . Avascular necrosis of bone of left hip (HCC) 07/08/2015  . Brown recluse spider bite 1990s   "they thought I was gonna die then"  . Cirrhosis of liver (HCC)    Sees Dr. Sung Amabileari Richards  . Depression   . Edema of both legs    Takes Lasix  . Gout X 1  . High cholesterol   . Hypertension   . Narcolepsy   .  Rheumatic fever   . Sleep apnea    pt stated "I have sleep apnea but I do not use the machine...Marland Kitchen.Marland Kitchen.Marland Kitchen.it broke" (07/08/2015)    Patient Active Problem List   Diagnosis Date Noted  . Respiratory distress 11/06/2016  . Altered mental status 11/05/2016  . Confusion 06/18/2016  . Acute hepatic encephalopathy 06/18/2016  . Fall at home, initial encounter 06/18/2016  . Hepatic encephalopathy (HCC) 06/18/2016  . GIB (gastrointestinal bleeding) 12/27/2015  . Hyperlipidemia 07/23/2015  . Depression 07/23/2015  . Avascular necrosis of bone of left hip (HCC) 07/08/2015  . S/P total hip arthroplasty 07/08/2015  . Ascites due to alcoholic cirrhosis (HCC) 05/19/2015    Past Surgical History:  Procedure Laterality Date  . COSMETIC SURGERY  ~ 1997   "nose done, lip drop, chin lift"  . ESOPHAGOGASTRODUODENOSCOPY N/A 12/27/2015   Procedure: ESOPHAGOGASTRODUODENOSCOPY (EGD);  Surgeon: Charlott RakesVincent Schooler, MD;  [facility];  Service: Endoscopy;  Laterality: N/A;  . FOOT FRACTURE SURGERY Right   . FRACTURE SURGERY    . INGUINAL HERNIA REPAIR Bilateral 1990s  . TONSILLECTOMY  1954  . TOTAL HIP ARTHROPLASTY Left 07/08/2015  . TOTAL HIP ARTHROPLASTY Left 07/08/2015   Procedure: LEFT TOTAL HIP ARTHROPLASTY;  Surgeon: Teryl LucyJoshua Landau, MD;  Location: [facility] OR;  Service: Orthopedics;  Laterality: Left;  . TOTAL KNEE ARTHROPLASTY Left 03/2008  .  US GUIDED PARACENTESIS [facility] HX)  April 2017    Prior to Admission medications   Medication Sig Start Date End Date Taking? Authorizing Provider  atorvastatin (LIPITOR) 10 MG tablet Take 1 tablet (10 mg total) by mouth daily. 05/20/15   Enid Baas, MD  bismuth subsalicylate (PEPTO BISMOL) 262 MG/15ML suspension Take 30 mLs by mouth every 6 (six) hours as needed.    [provider]  escitalopram (LEXAPRO) 20 MG tablet Take 20 mg by mouth daily.    [provider]  furosemide (LASIX) 20 MG tablet Take 20 mg by mouth daily. 06/08/16    [provider]  lactulose (CHRONULAC) 10 GM/15ML solution Take 30 mLs (20 g total) by mouth 2 (two) times daily. If patient has 3 good bowel movements don't need to give him next dose in evening 11/10/16   Auburn Bilberry, MD  lamoTRIgine (LAMICTAL) 25 MG tablet Take 25 mg by mouth 2 (two) times daily.  06/08/16   [provider]  Magnesium 400 MG TABS Take 1 tablet by mouth 2 (two) times daily.    [provider]  ondansetron (ZOFRAN) 4 MG tablet Take 1 tablet (4 mg total) by mouth every 8 (eight) hours as needed for nausea or vomiting. 07/08/15   Teryl Lucy, MD  oxycodone (OXY-IR) 5 MG capsule Take 1 capsule (5 mg total) by mouth every 4 (four) hours as needed. 11/10/16   Auburn Bilberry, MD  pantoprazole (PROTONIX) 40 MG tablet Take 1 tablet (40 mg total) by mouth 2 (two) times daily. 12/30/15   Auburn Bilberry, MD  rifaximin (XIFAXAN) 550 MG TABS tablet Take 1 tablet (550 mg total) by mouth 2 (two) times daily. 06/19/16   Milagros Loll, MD  spironolactone (ALDACTONE) 25 MG tablet Take 1 tablet (25 mg total) by mouth daily. 12/30/15   Auburn Bilberry, MD    Allergies Patient has no known allergies.  Family History  Problem Relation Age of Onset  . Melanoma Father   . Lung cancer Father     Social History Social History   Tobacco Use  . Smoking status: Never Smoker  . Smokeless tobacco: Never Used  Substance Use Topics  . Alcohol use: Yes    Alcohol/week: 0.0 oz    Comment: 07/08/2015 former Heavy ETOH abuse.; "haven't had a drink in 2017; AA told me I didn't need to come back"  . Drug use: No    Comment: 07/08/2015 "just in my early 20s"    Review of Systems Level 5 caveat:  history/ROS limited by altered mental status/confusion, some of which is at baseline, but some is also acute  ____________________________________________   PHYSICAL EXAM:  VITAL SIGNS: ED Triage Vitals  Enc Vitals Group     BP 01/31/17 0430 (!) 138/103     Pulse Rate  01/31/17 0430 84     Resp 01/31/17 0430 20     Temp 01/31/17 0430 98.1 F (36.7 C)     Temp Source 01/31/17 0430 Oral     SpO2 01/31/17 0422 96 %     Weight 01/31/17 0430 99.6 kg (219 lb 9.3 oz)     Height --      Head Circumference --      Peak Flow --      Pain Score 01/31/17 0429 8     Pain Loc --      Pain Edu? --      Excl. in GC? --     Constitutional: Awake and alert but  difficult to assess if he is altered from baseline or if this is his baseline.  He also has been drinking alcohol.  Appears to have chronic illness as confirmed in the medical record Head: Atraumatic. Nose: No congestion/rhinnorhea. Mouth/Throat: Mucous membranes are moist. Neck: No stridor.  No meningeal signs.  No cervical spine tenderness to palpation.  Subcutaneous emphysema with crepitus of left neck, more pronounced in torso. Cardiovascular: Normal rate, regular rhythm. Good peripheral circulation. Grossly normal heart sounds.  Subcutaneous emphysema with crepitus of left chest wall.  Respiratory: Increased respiratory effort possibly secondary to pain.  No retractions.  Some coarse breath sounds in the bases but there are breath sounds bilaterally to the apices Gastrointestinal: Obese, distended, no tenderness to palpation Musculoskeletal: No lower extremity tenderness nor edema.  Neurologic: Speech is garbled but according to the paramedics the family reported that was normal for him.  He is moving all 4 extremities and has no evidence of any focal neurological deficits Skin:  Skin is warm, dry and intact. No rash noted.   ____________________________________________   LABS (all labs ordered are listed, but only abnormal results are displayed)  Labs Reviewed  CBC WITH DIFFERENTIAL/PLATELET - Abnormal; Notable for the following components:      Result Value   WBC 11.2 (*)    RBC 4.02 (*)    HCT 39.3 (*)    RDW 16.9 (*)    Platelets 101 (*)    Neutro Abs 9.0 (*)    Lymphs Abs 0.9 (*)    All  other components within normal limits  COMPREHENSIVE METABOLIC PANEL - Abnormal; Notable for the following components:   Sodium 131 (*)    Glucose, Bld 135 (*)    Calcium 8.1 (*)    Albumin 3.2 (*)    AST 57 (*)    Total Bilirubin 4.1 (*)    All other components within normal limits  ETHANOL - Abnormal; Notable for the following components:   Alcohol, Ethyl (B) 233 (*)    All other components within normal limits  PROTIME-INR - Abnormal; Notable for the following components:   Prothrombin Time 16.3 (*)    All other components within normal limits  APTT - Abnormal; Notable for the following components:   aPTT 38 (*)    All other components within normal limits  AMMONIA - Abnormal; Notable for the following components:   Ammonia 63 (*)    All other components within normal limits  LIPASE, BLOOD  TROPONIN I  MAGNESIUM   ____________________________________________  EKG  ED ECG REPORT I, Loleta Rose, the attending physician, personally viewed and interpreted this ECG.  Date: 01/31/2017 EKG Time: 5:46 AM Rate: 77 Rhythm: Atrial paced complexes QRS Axis: normal Intervals: normal ST/T Wave abnormalities: Non-specific ST segment / T-wave changes, but no evidence of acute ischemia. Narrative Interpretation: no evidence of acute ischemia   ____________________________________________  RADIOLOGY I, Loleta Rose, personally viewed and evaluated these images (plain radiographs) and discussed with radiology as part of my medical decision making, as well as reviewing the written report by the radiologist.   Dg Chest Portable 1 View  Result Date: 01/31/2017 CLINICAL DATA:  Pneumothorax EXAM: PORTABLE CHEST 1 VIEW COMPARISON:  Chest radiograph 01/31/2017 at 4:28 a.m. FINDINGS: Cardiomediastinal contours are unchanged. There is no mediastinal shift. I do not see a pneumothorax, but a small pneumothorax probably would not be visible because of the extensive subcutaneous emphysema  overlying the entire chest. IMPRESSION: No visible pneumothorax, though the severe subcutaneous  emphysema wit likely obscuring the small pneumothorax. Electronically Signed   By: Deatra RobinsonKevin  Herman M.D.   On: 01/31/2017 06:01   Dg Chest Portable 1 View  Result Date: 01/31/2017 CLINICAL DATA:  Left posterior rib pain after fall, with shortness of breath and hypoxemia. Initial encounter. EXAM: PORTABLE CHEST 1 VIEW COMPARISON:  Chest radiograph performed 11/07/2016 FINDINGS: Diffuse soft tissue air is noted along the chest wall and neck. Mild vascular congestion is noted. Hazy interstitial opacity may reflect mild interstitial edema. A small left apical pneumothorax is suspected. There appear to be mildly displaced fractures of the left posterior eighth and ninth ribs. The cardiomediastinal silhouette is borderline normal in size. IMPRESSION: 1. Diffuse soft tissue air along the chest wall and neck. 2. Mildly displaced fractures of the left posterior eighth and ninth ribs. 3. Small left apical pneumothorax suspected. 4. Mild vascular congestion. Hazy interstitial opacities may reflect mild interstitial edema. These results were called by telephone at the time of interpretation on 01/31/2017 at 4:55 am to Dr. Loleta RoseORY Meily Glowacki, who verbally acknowledged these results. Electronically Signed   By: Roanna RaiderJeffery  Chang M.D.   On: 01/31/2017 04:58    ____________________________________________   PROCEDURES  Critical Care performed: Yes, see critical care procedure note(s)   Procedure(s) performed:   .Critical Care Performed by: Loleta RoseForbach, Chiante Peden, MD Authorized by: Loleta RoseForbach, Javarri Segal, MD   Critical care provider statement:    Critical care time (minutes):  60   Critical care time was exclusive of:  Separately billable procedures and treating other patients   Critical care was necessary to treat or prevent imminent or life-threatening deterioration of the following conditions:  Trauma and respiratory failure   Critical  care was time spent personally by me on the following activities:  Development of treatment plan with patient or surrogate, discussions with consultants, evaluation of patient's response to treatment, examination of patient, obtaining history from patient or surrogate, ordering and performing treatments and interventions, ordering and review of laboratory studies, ordering and review of radiographic studies, pulse oximetry, re-evaluation of patient's condition and review of old charts     ____________________________________________   INITIAL IMPRESSION / ASSESSMENT AND PLAN / ED COURSE  As part of my medical decision making, I reviewed the following data within the electronic MEDICAL RECORD NUMBER History obtained from family, Nursing notes reviewed and incorporated, Labs reviewed , Old chart reviewed, Radiograph reviewed , Discussed with radiologist, discussed with [facility],  and Notes from prior ED visits    Differential diagnosis includes, but is not limited to, hemothorax versus pneumothorax, acute chest contusion, pulmonary contusion, alcohol intoxication, hyperammonemia/hepatic encephalopathy, traumatic intra-abdominal or intrathoracic bleeding, rib fractures, etc.  I will evaluate broadly given that he is a poor historian at baseline and does seem to be acutely altered whether through an acute pathological process or through intoxication or both.  Obtaining extensive laboratory evaluation and stat chest  X-ray to rule out heme/pneumo.  Holding off on analgesia right now but the patient will likely need some.  However he was 85% on room air with a good waveform so I am reluctant to depress his respiratory drive at this time.  Placed on oxygen.  Clinical Course as of Feb 02 244  Mon Jan 31, 2017  [id number] Dr. Cherly Hensenhang with radiology called to discuss CXR.  Patient has extensive subcutaneous emphysema on the left side of torso and up into neck with a small apical pneumothorax and at least two posterior rib  fractures.  Discussed in person with patient's  secondary POA/legal guardian, and discussed with patient's sister who is the primary POA/legal guardian.  Patient is reportedly DNR/DNI, but I explained that this is an acute injury and needs treatment.  She agreed verbally for me to do whatever needed to stablize him, including thoracostomy, residual sedation, and intubation if necessary.  Also discussed the risks associated with all of these but particularly with the thoracostomy given his chronic medical issues.  She understands and agrees under the circumstances that he needs acute and emergent treatment as well as transfer to a major Medical Center.  Calling [facility] at her preference  [CF]  [phone] Spoke by phone with the [facility] transfer center, discussed situation, they will page trauma and get back to me  [CF]  0539 Patient stable on NRB 100% O2 for reinflation of small pneumothorax, spO2 around 94%.  Pain improved after repositioning.  [CF]  0542 Repeating CXR to look for any interval change in pneumothorax.  [CF]  0543 Ammonia: (!) 63 [CF]  0543 Alcohol, Ethyl (B): (!) 233 [CF]  0545 Satting 98% on NRB  [CF]  0555 Spoke by phone with the [facility] transfer center.  The trauma surgeon, Dr. Collier Flowers, has accepted the patient from ED to ED.  We are now trying to arrange transport by the fastest available option.  [facility] stated that it would be an hour and a half for 1 of their trucks or at least 40 minutes by helicopter.  I am checking with the [facility] to see if they can transport by emergency traffic faster than that.  We can send a nurse if necessary.  [CF]  [id number] Patient holding steady at 97-98% on 100% NRB.  [CF]  [phone] Repeat x-ray with extensive subcutaneous emphysema but no visible pneumothorax although the radiologist comments it may be hidden.  I viewed the images well and agree with the assessment.  I had to give the patient some fentanyl due to his severe pain and that has slightly  decrease his respirations and he has had a slight decrease in oxygen saturation commiserate with his decreased respiratory rate, but he is still maintaining oxygenation of about 93%.  He continues to have breath sounds up to the apices on both sides. DG Chest Portable 1 View [CF]  [phone] Patient leaving with EMS now, still 93-94% on NRB.  Patient stable for emergency transport.  [CF]    Clinical Course User Index [CF] Loleta Rose, MD    ____________________________________________  FINAL CLINICAL IMPRESSION(S) / ED DIAGNOSES  Final diagnoses:  Fall, initial encounter  Traumatic pneumothorax, initial encounter  Subcutaneous emphysema due to trauma, initial encounter (HCC)  Chest wall contusion, left, initial encounter  Acute respiratory failure with hypoxemia (HCC)  Alcoholic cirrhosis of liver with ascites (HCC)  Hyperammonemia (HCC)  Alcoholic intoxication with complication (HCC)  Closed fracture of multiple ribs of left side, initial encounter     MEDICATIONS GIVEN DURING THIS VISIT:  Medications  morphine 4 MG/ML injection 4 mg (4 mg Intravenous Given 01/31/17 0451)  ondansetron (ZOFRAN) injection 4 mg (4 mg Intravenous Given 01/31/17 0451)  fentaNYL (SUBLIMAZE) injection 50 mcg (50 mcg Intravenous Given 01/31/17 0609)     ED Discharge Orders    None       Note:  This document was prepared using Dragon voice recognition software and may include unintentional dictation errors.    Loleta Rose, MD 02/01/17 [phone]

## 2017-01-31 NOTE — ED Notes (Signed)
Family at bedside. 

## 2017-01-31 NOTE — ED Notes (Signed)
MD on phone with family discussing treatment options

## 2017-02-03 ENCOUNTER — Other Ambulatory Visit: Payer: Self-pay | Admitting: Unknown Physician Specialty

## 2017-02-03 DIAGNOSIS — K7031 Alcoholic cirrhosis of liver with ascites: Secondary | ICD-10-CM

## 2017-02-04 ENCOUNTER — Ambulatory Visit: Admission: RE | Admit: 2017-02-04 | Payer: Medicare HMO | Source: Ambulatory Visit

## 2017-02-11 ENCOUNTER — Ambulatory Visit: Admission: RE | Admit: 2017-02-11 | Payer: Medicare HMO | Source: Ambulatory Visit

## 2017-02-14 DIAGNOSIS — Z9981 Dependence on supplemental oxygen: Secondary | ICD-10-CM | POA: Diagnosis not present

## 2017-02-14 DIAGNOSIS — I4891 Unspecified atrial fibrillation: Secondary | ICD-10-CM | POA: Diagnosis present

## 2017-02-14 DIAGNOSIS — S199XXA Unspecified injury of neck, initial encounter: Secondary | ICD-10-CM | POA: Diagnosis not present

## 2017-02-14 DIAGNOSIS — Z66 Do not resuscitate: Secondary | ICD-10-CM | POA: Diagnosis present

## 2017-02-14 DIAGNOSIS — Z9181 History of falling: Secondary | ICD-10-CM | POA: Diagnosis not present

## 2017-02-14 DIAGNOSIS — Z96652 Presence of left artificial knee joint: Secondary | ICD-10-CM | POA: Diagnosis not present

## 2017-02-14 DIAGNOSIS — G0490 Encephalitis and encephalomyelitis, unspecified: Secondary | ICD-10-CM | POA: Diagnosis not present

## 2017-02-14 DIAGNOSIS — J189 Pneumonia, unspecified organism: Secondary | ICD-10-CM | POA: Diagnosis not present

## 2017-02-14 DIAGNOSIS — M5489 Other dorsalgia: Secondary | ICD-10-CM | POA: Diagnosis not present

## 2017-02-14 DIAGNOSIS — G4733 Obstructive sleep apnea (adult) (pediatric): Secondary | ICD-10-CM | POA: Diagnosis present

## 2017-02-14 DIAGNOSIS — R4182 Altered mental status, unspecified: Secondary | ICD-10-CM | POA: Diagnosis present

## 2017-02-14 DIAGNOSIS — I129 Hypertensive chronic kidney disease with stage 1 through stage 4 chronic kidney disease, or unspecified chronic kidney disease: Secondary | ICD-10-CM | POA: Diagnosis present

## 2017-02-14 DIAGNOSIS — J939 Pneumothorax, unspecified: Secondary | ICD-10-CM | POA: Diagnosis not present

## 2017-02-14 DIAGNOSIS — A0471 Enterocolitis due to Clostridium difficile, recurrent: Secondary | ICD-10-CM | POA: Diagnosis not present

## 2017-02-14 DIAGNOSIS — K652 Spontaneous bacterial peritonitis: Secondary | ICD-10-CM | POA: Diagnosis not present

## 2017-02-14 DIAGNOSIS — R488 Other symbolic dysfunctions: Secondary | ICD-10-CM | POA: Diagnosis not present

## 2017-02-14 DIAGNOSIS — Z96642 Presence of left artificial hip joint: Secondary | ICD-10-CM | POA: Diagnosis not present

## 2017-02-14 DIAGNOSIS — M545 Low back pain: Secondary | ICD-10-CM | POA: Diagnosis not present

## 2017-02-14 DIAGNOSIS — A047 Enterocolitis due to Clostridium difficile: Secondary | ICD-10-CM | POA: Diagnosis not present

## 2017-02-14 DIAGNOSIS — N189 Chronic kidney disease, unspecified: Secondary | ICD-10-CM | POA: Diagnosis present

## 2017-02-14 DIAGNOSIS — K7290 Hepatic failure, unspecified without coma: Secondary | ICD-10-CM | POA: Diagnosis not present

## 2017-02-14 DIAGNOSIS — W19XXXA Unspecified fall, initial encounter: Secondary | ICD-10-CM | POA: Diagnosis not present

## 2017-02-14 DIAGNOSIS — R451 Restlessness and agitation: Secondary | ICD-10-CM | POA: Diagnosis present

## 2017-02-14 DIAGNOSIS — A0472 Enterocolitis due to Clostridium difficile, not specified as recurrent: Secondary | ICD-10-CM | POA: Diagnosis not present

## 2017-02-14 DIAGNOSIS — R410 Disorientation, unspecified: Secondary | ICD-10-CM | POA: Diagnosis not present

## 2017-02-14 DIAGNOSIS — F0390 Unspecified dementia without behavioral disturbance: Secondary | ICD-10-CM | POA: Diagnosis present

## 2017-02-14 DIAGNOSIS — Z515 Encounter for palliative care: Secondary | ICD-10-CM | POA: Diagnosis present

## 2017-02-14 DIAGNOSIS — E7800 Pure hypercholesterolemia, unspecified: Secondary | ICD-10-CM | POA: Diagnosis not present

## 2017-02-14 DIAGNOSIS — M1990 Unspecified osteoarthritis, unspecified site: Secondary | ICD-10-CM | POA: Diagnosis not present

## 2017-02-14 DIAGNOSIS — S270XXD Traumatic pneumothorax, subsequent encounter: Secondary | ICD-10-CM | POA: Diagnosis not present

## 2017-02-14 DIAGNOSIS — F419 Anxiety disorder, unspecified: Secondary | ICD-10-CM | POA: Diagnosis not present

## 2017-02-14 DIAGNOSIS — S2243XA Multiple fractures of ribs, bilateral, initial encounter for closed fracture: Secondary | ICD-10-CM | POA: Diagnosis not present

## 2017-02-14 DIAGNOSIS — M6281 Muscle weakness (generalized): Secondary | ICD-10-CM | POA: Diagnosis not present

## 2017-02-14 DIAGNOSIS — R2689 Other abnormalities of gait and mobility: Secondary | ICD-10-CM | POA: Diagnosis not present

## 2017-02-14 DIAGNOSIS — G9341 Metabolic encephalopathy: Secondary | ICD-10-CM | POA: Diagnosis not present

## 2017-02-14 DIAGNOSIS — R188 Other ascites: Secondary | ICD-10-CM | POA: Diagnosis not present

## 2017-02-14 DIAGNOSIS — S3992XA Unspecified injury of lower back, initial encounter: Secondary | ICD-10-CM | POA: Diagnosis not present

## 2017-02-14 DIAGNOSIS — F1010 Alcohol abuse, uncomplicated: Secondary | ICD-10-CM | POA: Diagnosis not present

## 2017-02-14 DIAGNOSIS — J9601 Acute respiratory failure with hypoxia: Secondary | ICD-10-CM | POA: Diagnosis not present

## 2017-02-14 DIAGNOSIS — E871 Hypo-osmolality and hyponatremia: Secondary | ICD-10-CM | POA: Diagnosis not present

## 2017-02-14 DIAGNOSIS — T148XXA Other injury of unspecified body region, initial encounter: Secondary | ICD-10-CM | POA: Diagnosis not present

## 2017-02-14 DIAGNOSIS — K7210 Chronic hepatic failure without coma: Secondary | ICD-10-CM | POA: Diagnosis not present

## 2017-02-14 DIAGNOSIS — R531 Weakness: Secondary | ICD-10-CM | POA: Diagnosis not present

## 2017-02-14 DIAGNOSIS — R079 Chest pain, unspecified: Secondary | ICD-10-CM | POA: Diagnosis not present

## 2017-02-14 DIAGNOSIS — S2242XD Multiple fractures of ribs, left side, subsequent encounter for fracture with routine healing: Secondary | ICD-10-CM | POA: Diagnosis not present

## 2017-02-14 DIAGNOSIS — N179 Acute kidney failure, unspecified: Secondary | ICD-10-CM | POA: Diagnosis not present

## 2017-02-14 DIAGNOSIS — K7031 Alcoholic cirrhosis of liver with ascites: Secondary | ICD-10-CM | POA: Diagnosis not present

## 2017-02-15 ENCOUNTER — Encounter: Payer: Self-pay | Admitting: Internal Medicine

## 2017-02-15 ENCOUNTER — Non-Acute Institutional Stay (SKILLED_NURSING_FACILITY): Payer: Commercial Managed Care - HMO | Admitting: Internal Medicine

## 2017-02-15 DIAGNOSIS — S270XXA Traumatic pneumothorax, initial encounter: Secondary | ICD-10-CM

## 2017-02-15 DIAGNOSIS — K7031 Alcoholic cirrhosis of liver with ascites: Secondary | ICD-10-CM | POA: Diagnosis not present

## 2017-02-15 DIAGNOSIS — S270XXD Traumatic pneumothorax, subsequent encounter: Secondary | ICD-10-CM

## 2017-02-15 DIAGNOSIS — S2242XD Multiple fractures of ribs, left side, subsequent encounter for fracture with routine healing: Secondary | ICD-10-CM

## 2017-02-15 DIAGNOSIS — S2249XA Multiple fractures of ribs, unspecified side, initial encounter for closed fracture: Secondary | ICD-10-CM | POA: Insufficient documentation

## 2017-02-15 DIAGNOSIS — K7290 Hepatic failure, unspecified without coma: Secondary | ICD-10-CM

## 2017-02-15 DIAGNOSIS — K7682 Hepatic encephalopathy: Secondary | ICD-10-CM

## 2017-02-15 MED ORDER — ZOLPIDEM TARTRATE 5 MG PO TABS
5.00 | ORAL_TABLET | ORAL | Status: DC
Start: ? — End: 2017-02-15

## 2017-02-15 MED ORDER — FUROSEMIDE 20 MG PO TABS
20.00 | ORAL_TABLET | ORAL | Status: DC
Start: 2017-02-15 — End: 2017-02-15

## 2017-02-15 MED ORDER — GENERIC EXTERNAL MEDICATION
6.25 | Status: DC
Start: ? — End: 2017-02-15

## 2017-02-15 MED ORDER — SPIRONOLACTONE 25 MG PO TABS
25.00 | ORAL_TABLET | ORAL | Status: DC
Start: 2017-02-15 — End: 2017-02-15

## 2017-02-15 MED ORDER — LIDOCAINE 5 % EX PTCH
1.00 | MEDICATED_PATCH | CUTANEOUS | Status: DC
Start: ? — End: 2017-02-15

## 2017-02-15 MED ORDER — LACTULOSE 10 GM/15ML PO SOLN
30.00 | ORAL | Status: DC
Start: 2017-02-14 — End: 2017-02-15

## 2017-02-15 MED ORDER — GENERIC EXTERNAL MEDICATION
Status: DC
Start: ? — End: 2017-02-15

## 2017-02-15 MED ORDER — LIDOCAINE 5 % EX PTCH
2.00 | MEDICATED_PATCH | CUTANEOUS | Status: DC
Start: 2017-02-14 — End: 2017-02-15

## 2017-02-15 NOTE — Progress Notes (Signed)
NURSING HOME LOCATION:  [facility] ROOM NUMBER:  [id number]  CODE STATUS:  DNR  PCP:  [name], [name], MD  [phone] [address] [facility] [place] [id number]   This is a comprehensive admission note to [facility] performed on this date less than 30 days from date of admission. Included are preadmission medical/surgical history;reconciled medication list; family history; social history and comprehensive review of systems.  Corrections and additions to the records were documented . Comprehensive physical exam was also performed. Additionally a clinical summary was entered for each active diagnosis pertinent to this admission in the Problem List to enhance continuity of care.  HPI: The patient [facility] under the trauma-critical care service 01/31/17-02/14/17. He was admitted in transfer 01/31/17 after falling against the toilet. Apparently there was no cardiac or neurologic prodrome prior to the fall. He has chronic balance and weakness issues. He was found to have bilateral pneumothoraces and pneumomediastinum with moderately displaced fractures of the posterior left 7th -ninth ribs and nondisplaced fracture of the posterior 10th-12th ribs. The left chest tube was removed 12/31, chest x-ray on 1/1 revealed no pneumothorax. The patient had end-stage liver disease and was requiring weekly paracenteses. On 12/28 4.5 L was removed. On 1/4 to T6 was attempted, only 500 mL was obtained. On 1/5 3250 mL of fluid was drained. The discharge summary states that the ascites fluid drainage at the SNF would need to be scheduled. The patient was continued on diuresis with spironolactone and furosemide as well as lactulose therapy.Aggressive diuresis was intermittently held due to increasing BUN and creatinine. C. difficile on stool was positive, the patient continued to have diarrhea in the context of lactulose therapy. Vancomycin was to be continued  every 6 hours for 2 weeks with a stop date of 1/11. Subsequent weaning of the vancomycin as delineated in the orders. The patient did intermittently refuse lactulose due to diarrhea. Palliative care consulted, he was not a candidate for inpatient palliative treatment. Patient family decided to temporarily rescind home hospice orders for short-term rehabilitation. As of 02/07/17 no SNF bed offer had been received after over 10 requests. He did exhibit hyponatremia, on 02/09/17 sodium was 126 and had been exhibiting a slow climb. Creatinine had risen to 1.7 and BUN 47. He exhibited persistent leukocytosis with white counts up to 16,100. On 1/2 the white count was 14,000. Anemia was stable with a hematocrit of 33.5. He had mild thrombocytopenia with platelet counts as low as 117,000. On 1/2 platelet  # was 146,000.  Past medical and surgical history: Includes history of sleep apnea with machine noncompliance, rheumatic fever, narcolepsy, dyslipidemia, gout, depression, hepatic cirrhosis, and avascular necrosis of the left hip. He's had left knee arthroplasty and left hip arthroplasty. He's also had cosmetic surgery done in 1997.  Social history: He never smoked, he states he drank socially. He states he quit over a year ago. 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 He states he lives alone.  Family history: Reviewed  Review of systems: veracity of review of systems was in question due to dementia. Date given as 02/22/2013. He did change it to 2019 . Mental status testing with SLUMS revealed a value of 19 out of 30, compatible with dementia.  He admits to being confused. He believes someone has taken his cell phone and given it to his mother. He  states he realized that when he was trying to call his mother; she had died approximately 03-06-16.  He states that his pain is improving. He has been falling repeatedly due to poor  balance prior to admission despite using canes.  Physical exam:  Pertinent or positive findings:Pattern alopecia is present. He has minimal anisocoria. There is slight ptosis on the left. He has a beard and mustache. He is wearing an upper partial. Heart sounds are very distant. There is suggestion of a soft murmur. Intercostal retractions are noted on the left with inspirations. He has minor scattered rhonchi. There is no cellulitis or drainage at the site of the prior chest tube on the left. Abdomen is protuberant. Bowel sounds are decreased. He is weaker in the upper extremities than the lower extremities. Fusiform change of the knees are present. Dorsalis pedis pulses are decreased. The posterior tibial pulses are strong. He has valgus deformities of the knees. Extensive bruising over the upper extremities. Interosseous wasting of the upper extremities present.   General appearance:no acute distress , increased work of breathing is present.   Lymphatic: No lymphadenopathy about the head, neck, axilla . Eyes: No conjunctival inflammation or lid edema is present. There is no scleral icterus. Ears:  External ear exam shows no significant lesions or deformities.   Nose:  External nasal examination shows no deformity or inflammation. Nasal mucosa are pink and moist without lesions ,exudates Oral exam: lips and gums are healthy appearing.There is no oropharyngeal erythema or exudate . Neck:  No thyromegaly, masses, tenderness noted.    Heart:  No gallop, click, rub .  Lungs: without wheezes, rales , rubs. Abdomen: with no organomegaly, hernias,masses. GU: deferred  Extremities:  No cyanosis, clubbing,edema  Neurologic exam : Balance,Rhomberg,finger to nose testing could not be completed due to clinical state Skin: Warm & dry w/o tenting. No significant lesions or rash.  See clinical summary under each active problem in the Problem List with associated updated therapeutic plan

## 2017-02-15 NOTE — Patient Instructions (Addendum)
See assessment and plan under each diagnosis in the problem list and acutely for this visit  BMET and CBC will be checked in the morning. [facility]acent[facility]

## 2017-02-15 NOTE — Assessment & Plan Note (Addendum)
While at SNF weekly paracentesis were recommended; but this is not an option in this facility. There is no documentation this has been scheduled.  As this was being done on a scheduled basis every Friday [facility] [place]; this will be resumed as of 1/11

## 2017-02-15 NOTE — Discharge Instructions (Signed)
Paracentesis, Care After °Refer to this sheet in the next few weeks. These instructions provide you with information about caring for yourself after your procedure. Your health care provider may also give you more specific instructions. Your treatment has been planned according to current medical practices, but problems sometimes occur. Call your health care provider if you have any problems or questions after your procedure. °What can I expect after the procedure? °After your procedure, it is common to have a small amount of clear fluid coming from the puncture site. °Follow these instructions at home: °· Return to your normal activities as told by your health care provider. Ask your health care provider what activities are safe for you. °· Take over-the-counter and prescription medicines only as told by your health care provider. °· Do not take baths, swim, or use a hot tub until your health care provider approves. °· Follow instructions from your health care provider about: °? How to take care of your puncture site. °? When and how you should change your bandage (dressing). °? When you should remove your dressing. °· Check your puncture area every day signs of infection. Watch for: °? Redness, swelling, or pain. °? Fluid, blood, or pus. °· Keep all follow-up visits as told by your health care provider. This is important. °Contact a health care provider if: °· You have redness, swelling, or pain at your puncture site. °· You start to have more clear fluid coming from your puncture site. °· You have blood or pus coming from your puncture site. °· You have chills. °· You have a fever. °Get help right away if: °· You develop chest pain or shortness of breath. °· You develop increasing pain, discomfort, or swelling in your abdomen. °· You feel dizzy or light-headed or you pass out. °This information is not intended to replace advice given to you by your health care provider. Make sure you discuss any questions you  have with your health care provider. °Document Released: 06/11/2014 Document Revised: 07/03/2015 Document Reviewed: 04/09/2014 °Elsevier Interactive Patient Education © 2018 [employer] ° °

## 2017-02-15 NOTE — Assessment & Plan Note (Signed)
PT/OT at SNF Wean narcotics as tolerated

## 2017-02-15 NOTE — Assessment & Plan Note (Signed)
See 02/15/17 score of 19 out of 30 on SLUMS mental status testing SLUMS is a mental status assessment of possible dementia published by the [facility] [employer]. The test differentiates between high school or less education level in reference to presence or absence of dementia. For high school education a score of 27-30 is normal, 21-26 is minimal neurocognitive deficit and 1-22 suggests dementia. With less than a high school education similar scoring is 25-30, 20-24, and 1-19.

## 2017-02-16 ENCOUNTER — Other Ambulatory Visit: Payer: Self-pay | Admitting: *Deleted

## 2017-02-16 LAB — CBC AND DIFFERENTIAL
HCT: 36 — AB (ref 41–53)
Hemoglobin: 12.2 — AB (ref 13.5–17.5)
Neutrophils Absolute: 9
Platelets: 168 (ref 150–399)
WBC: 11.8

## 2017-02-16 NOTE — Patient Outreach (Signed)
[facility] Care Management  02/16/2017  Lawrence BalesDavid H West 04/01/1945 [id number]  Transition of Care Referral  Referral Date: 02/15/17 Referral Source: [employer]  Date of Discharge: 02/14/17 [facility] Discharge Diagnosis: Falls Insurance: [facility]  Outreach attempt to patient. Patient's friend reported, patient is at a nursing home on [address], [facility].    Plan: RN CM will contact [name] on [address].  [name], RN, BSN, MHA/MSL, [facility] Telephonic Care Manager Coordinator [facility] Direct Phone: [phone] Toll Free: [phone] Fax: [phone]

## 2017-02-16 NOTE — Patient Outreach (Addendum)
[facility]) Care Management  02/16/2017  Sandria BalesDavid H Searles 08/23/1945 [id number]  Outreach telephone call to [facility]. Spoke with Crystal, SW and she stated patient is only at the facility for short term.  Plan: RN CM will send [facility] referral for possible assistance with transition from rehab to home.  Wynelle ClevelandJuanita Asheton Viramontes, RN, BSN, MHA/MSL, [facility] Telephonic Care Manager Coordinator [facility] Direct Phone: [phone] Cell Phone: [phone] Toll Free: [phone] Fax: [phone]

## 2017-02-17 ENCOUNTER — Non-Acute Institutional Stay (SKILLED_NURSING_FACILITY): Payer: Commercial Managed Care - HMO | Admitting: Adult Health

## 2017-02-17 ENCOUNTER — Encounter: Payer: Self-pay | Admitting: Adult Health

## 2017-02-17 ENCOUNTER — Other Ambulatory Visit: Payer: Self-pay | Admitting: Unknown Physician Specialty

## 2017-02-17 DIAGNOSIS — K7031 Alcoholic cirrhosis of liver with ascites: Secondary | ICD-10-CM

## 2017-02-17 NOTE — Progress Notes (Signed)
Location:  [facility] Room Number: 366-Y Place of Service:  SNF (31) Provider:  Durenda Age, NP  Patient Care Team: Derinda Late, MD as PCP - General (Family Medicine) Vern Claude, [name] as [place] Management  Extended Emergency Contact Information Primary Emergency Contact: Pagette,[place]  [place] Phone: [phone] Relation: Sister Secondary Emergency Contact: [facility] Address: [address]          [place], [place] [address] Johnnette Litter of [place] Phone: [phone] Relation: Friend  Code Status:  DNR  Goals of care: Advanced Directive information Advanced Directives 02/17/2017  Does Patient Have a Medical Advance Directive? Yes  Type of Advance Directive Out of facility DNR (pink MOST or yellow form)  Does patient want to make changes to medical advance directive? No - Patient declined  Copy of [place] in Chart? -  Would patient like information on creating a medical advance directive? No - Patient declined     Chief Complaint  Patient presents with  . Acute Visit    HPI:  Pt is a 72 y.o. male seen today for followup on his labs and condition.  He was admitted to [facility] on 02/14/17 for short-term rehabilitation following an admission at [facility] after falling against toilet sustaining bilateral pneumothoraces, pneumomediastinum, moderately displaced fractures of the posterior left 7-9th ribs and nondisplaced fracture of the posterior left 10th-12th ribs. Left chest tube was removed on 12/31. He has end-stage liver disease requiring weekly paracentesis. He went out of [place] today for paracentesis but came back since paracentesis was scheduled for tomorrow. He has a PMH of sleep apnea with machine noncompliance, rheumatic fever, narcolepsy, dyslipidemia, gout, depression, hepatic cirrhosis, and avascular  necrosis of the left hip.    Past Medical History:  Diagnosis Date  . Arthritis    "left knee" (07/08/2015)  . Ascites   . Avascular necrosis of bone of left hip ([facility]) 07/08/2015  . Brown recluse spider bite 1990s   "they thought I was gonna die then"  . Cirrhosis of liver (HCC)    Sees Dr. Claudie Leach  . Depression   . Edema of both legs    Takes Lasix  . Gout X 1  . High cholesterol   . Hypertension   . Narcolepsy   . Rheumatic fever   . Sleep apnea    pt stated "I have sleep apnea but I do not use the machine...Marland KitchenMarland KitchenMarland Kitchenit broke" (07/08/2015)   Past Surgical History:  Procedure Laterality Date  . COSMETIC SURGERY  ~ 1997   "nose done, lip drop, chin lift"  . ESOPHAGOGASTRODUODENOSCOPY N/A 12/27/2015   Procedure: ESOPHAGOGASTRODUODENOSCOPY (EGD);  Surgeon: Wilford Corner, MD;  Location[facility] ENDOSCOPY;  Service: Endoscopy;  Laterality: N/A;  . FOOT FRACTURE SURGERY Right   . FRACTURE SURGERY    . INGUINAL HERNIA REPAIR Bilateral 1990s  . TONSILLECTOMY  1954  . TOTAL HIP ARTHROPLASTY Left 07/08/2015   Procedure: LEFT TOTAL HIP ARTHROPLASTY;  Surgeon: Marchia Bond, MD;  Location: [facility];  Service: Orthopedics;  Laterality: Left;  . TOTAL KNEE ARTHROPLASTY Left 03/2008  . US GUIDED PARACENTESIS [facility] HX)  April 2017    No Known Allergies  Outpatient Encounter Medications as of 02/17/2017  Medication Sig  . Amino Acids-Protein Hydrolys (FEEDING SUPPLEMENT, PRO-STAT SUGAR FREE 64,) LIQD Take 30 mLs by mouth 2 (two) times daily.  Marland Kitchen atorvastatin (LIPITOR) 10 MG tablet Take 1 tablet (10  mg total) by mouth daily.  Marland Kitchen escitalopram (LEXAPRO) 10 MG tablet Take 10 mg by mouth daily.  . furosemide (LASIX) 40 MG tablet Take 40 mg by mouth daily.  . Lactulose 20 GM/30ML SOLN Take 30 mLs by mouth every 8 (eight) hours.   Marland Kitchen lamoTRIgine (LAMICTAL) 25 MG tablet Take 25 mg by mouth 2 (two) times daily.   . Liniments (SALONPAS PAIN RELIEF PATCH EX) Apply 1 each topically daily. Apply to left  chest at site of greatest pain  . Magnesium 400 MG TABS Take 1 tablet by mouth 2 (two) times daily.  . ondansetron (ZOFRAN) 4 MG tablet Take 4 mg by mouth every 6 (six) hours as needed for nausea or vomiting.  Marland Kitchen oxyCODONE (ROXICODONE) 15 MG immediate release tablet Take 7.5 mg by mouth every 4 (four) hours as needed for pain. Give 0.5 tablet a4h prn x 42 doses  . OXYGEN Inhale 2 L/min into the lungs.  . pantoprazole (PROTONIX) 40 MG tablet Take 1 tablet (40 mg total) by mouth 2 (two) times daily.  . rifaximin (XIFAXAN) 550 MG TABS tablet Take 1 tablet (550 mg total) by mouth 2 (two) times daily.  Marland Kitchen spironolactone (ALDACTONE) 25 MG tablet Take 1 tablet (25 mg total) by mouth daily.  . Vancomycin HCl (FIRVANQ) 50 MG/ML SOLR Take 125 mg by mouth every 6 (six) hours.  Derrill Memo ON 02/25/2017] Vancomycin HCl (FIRVANQ) 50 MG/ML SOLR Take 125 mg by mouth daily.  Derrill Memo ON 03/04/2017] Vancomycin HCl (FIRVANQ) 50 MG/ML SOLR Take 125 mg by mouth every other day.  . Vancomycin HCl (FIRVANQ) 50 MG/ML SOLR Take 125 mg by mouth every 3 (three) days.  Derrill Memo ON 02/19/2017] Vancomycin HCl (FIRVANQ) 50 MG/ML SOLR Take 125 mg by mouth every 12 (twelve) hours.  Marland Kitchen zolpidem (AMBIEN) 5 MG tablet Take 5 mg by mouth at bedtime as needed for sleep.  . [DISCONTINUED] lidocaine (LIDODERM) 5 % Place 2 patches onto the skin every 12 (twelve) hours. Remove & Discard patch within 12 hours or as directed by MD   No facility-administered encounter medications on file as of 02/17/2017.     Review of Systems  GENERAL: no fever, chills or weakness MOUTH and THROAT: Denies oral discomfort, gingival pain or bleeding RESPIRATORY: no cough, wheezing, hemoptysis CARDIAC: No chest pain, edema or palpitations GI: No abdominal pain, heart burn, nausea or vomiting GU: Denies dysuria, frequency, hematuria, incontinence, or discharge PSYCHIATRIC: Denies feelings of depression or anxiety. No report of hallucinations, insomnia, paranoia,  or agitation    Immunization History  Administered Date(s) Administered  . Influenza, High Dose Seasonal PF 11/08/2016  . Pneumococcal Polysaccharide-23 11/16/2013  . Pneumococcal-Unspecified 10/10/2006   Pertinent  Health Maintenance Due  Topic Date Due  . COLONOSCOPY  07/09/1996  . PNA vac Low Risk Adult (2 of 2 - PCV13) 11/17/2014  . INFLUENZA VACCINE  Completed      Vitals:   02/17/17 1049  BP: 132/75  Pulse: 63  Resp: 20  Temp: 97.7 F (36.5 C)  TempSrc: Oral  SpO2: 91%  Weight: 219 lb 9.3 oz (99.6 kg)  Height: 5' 10" (1.778 m)   Body mass index is 31.51 kg/m.  Physical Exam  GENERAL APPEARANCE: Well nourished. In no acute distress. Obese SKIN:  Skin is warm and dry.  MOUTH and THROAT: Lips are without lesions. Oral mucosa is moist and without lesions.  RESPIRATORY: Breathing is even & unlabored, BS CTAB CARDIAC: RRR, no murmur,no extra heart sounds,  no edema GI: normal BS EXTREMITIES:  Able to move X 4 extremities PSYCHIATRIC: Alert to self, disoriented to time and place. Affect and behavior are appropriate   Labs reviewed: 02/16/17  WBC 11.8 hemoglobin 12.3 hematocrit 36.4 MCV 100.1 platelets 168 glucose 122 calcium 8.2 creatinine 0.91 BUN 24.4 sodium 127 eGFR 84.72 Recent Labs    11/07/16 0543 11/09/16 0419 01/31/17 0428  NA 131* 136 131*  K 3.8 4.1 4.0  CL 103 107 101  CO2 _0 GLUCOSE 120* 122* 135*  BUN 21* 20 12  CREATININE 1.13 1.06 1.11  CALCIUM 7.9* 8.3* 8.1*  MG  --   --  2.2   Recent Labs    11/07/16 0543 11/09/16 0419 01/31/17 0428  AST 66* 61* 57*  ALT _1 ALKPHOS 54 77 71  BILITOT 3.7* 2.2* 4.1*  PROT 5.4* 5.1* 6.6  ALBUMIN 2.5* 2.3* 3.2*   Recent Labs    11/07/16 0543 11/09/16 0419 01/31/17 0428 02/16/17  WBC 11.3* 4.9 11.2* 11.8  NEUTROABS  --   --  9.0* 9  HGB 11.7* 12.0* 13.5 12.2*  HCT 34.3* 34.5* 39.3* 36*  MCV 98.3 101.2* 97.7  --   PLT 60* 71* 101* 168   Lab Results  Component Value Date    TSH 1.25 10/23/2013   Significant Diagnostic Results in last 30 days:  US Paracentesis  Result Date: 01/28/2017 INDICATION: Cirrhosis, ascites EXAM: ULTRASOUND GUIDED PARACENTESIS MEDICATIONS: 1% lidocaine local COMPLICATIONS: None immediate. PROCEDURE: Informed written consent was obtained from the patient after a discussion of the risks, benefits and alternatives to treatment. A timeout was performed prior to the initiation of the procedure. Initial ultrasound scanning demonstrates a large amount of ascites within the right lower abdominal quadrant. The right lower abdomen was prepped and draped in the usual sterile fashion. 1% lidocaine with epinephrine was used for local anesthesia. Following this, a 6 Fr Safe-T-Centesis catheter was introduced. An ultrasound image was saved for documentation purposes. The paracentesis was performed. The catheter was removed and a dressing was applied. The patient tolerated the procedure well without immediate post procedural complication. FINDINGS: A total of approximately 9.2 L of clear peritoneal fluid was removed. Sample was not sent for laboratory analysis IMPRESSION: Successful ultrasound-guided paracentesis yielding 9.2 liters of peritoneal fluid. Electronically Signed   By: Jerilynn Mages.  Shick M.D.   On: 01/28/2017 14:25   US Paracentesis  Result Date: 01/21/2017 INDICATION: Cirrhosis and ascites. EXAM: ULTRASOUND GUIDED PARACENTESIS MEDICATIONS: None. COMPLICATIONS: None immediate. PROCEDURE: Informed written consent was obtained from the patient after a discussion of the risks, benefits and alternatives to treatment. A timeout was performed prior to the initiation of the procedure. Initial ultrasound was performed to localize ascites. The right lower abdomen was prepped and draped in the usual sterile fashion. 1% lidocaine was used for local anesthesia. Following this, a 6 Fr Safe-T-Centesis catheter was introduced. An ultrasound image was saved for documentation  purposes. The paracentesis was performed. The catheter was removed and a dressing was applied. The patient tolerated the procedure well without immediate post procedural complication. FINDINGS: A total of approximately 7.6 L of clear, yellow fluid was removed. IMPRESSION: Successful ultrasound-guided paracentesis yielding 7.6 liters of peritoneal fluid. Electronically Signed   By: Aletta Edouard M.D.   On: 01/21/2017 15:54   Dg Chest Portable 1 View  Result Date: 01/31/2017 CLINICAL DATA:  Pneumothorax EXAM: PORTABLE CHEST 1 VIEW COMPARISON:  Chest radiograph 01/31/2017 at 4:28 a.m. FINDINGS: Cardiomediastinal contours  are unchanged. There is no mediastinal shift. I do not see a pneumothorax, but a small pneumothorax probably would not be visible because of the extensive subcutaneous emphysema overlying the entire chest. IMPRESSION: No visible pneumothorax, though the severe subcutaneous emphysema wit likely obscuring the small pneumothorax. Electronically Signed   By: Ulyses Jarred M.D.   On: 01/31/2017 06:01   Dg Chest Portable 1 View  Result Date: 01/31/2017 CLINICAL DATA:  Left posterior rib pain after fall, with shortness of breath and hypoxemia. Initial encounter. EXAM: PORTABLE CHEST 1 VIEW COMPARISON:  Chest radiograph performed 11/07/2016 FINDINGS: Diffuse soft tissue air is noted along the chest wall and neck. Mild vascular congestion is noted. Hazy interstitial opacity may reflect mild interstitial edema. A small left apical pneumothorax is suspected. There appear to be mildly displaced fractures of the left posterior eighth and ninth ribs. The cardiomediastinal silhouette is borderline normal in size. IMPRESSION: 1. Diffuse soft tissue air along the chest wall and neck. 2. Mildly displaced fractures of the left posterior eighth and ninth ribs. 3. Small left apical pneumothorax suspected. 4. Mild vascular congestion. Hazy interstitial opacities may reflect mild interstitial edema. These results  were called by telephone at the time of interpretation on 01/31/2017 at 4:55 am to Dr. Hinda Kehr, who verbally acknowledged these results. Electronically Signed   By: Garald Balding M.D.   On: 01/31/2017 04:58    Assessment/Plan  1. Ascites due to alcoholic cirrhosis [facility]) - will have paracentesis Q Fridays @ [facility], will continue to follow-up and monitor     Durenda Age, NP [facility] and [facility] [phone] (Monday-Friday 8:00 a.m. - 5:00 p.m.) [phone] (after hours)

## 2017-02-18 ENCOUNTER — Ambulatory Visit
Admission: RE | Admit: 2017-02-18 | Discharge: 2017-02-18 | Disposition: A | Discharge status: Home / Self Care | Payer: Medicare HMO | Source: Ambulatory Visit | Attending: Unknown Physician Specialty | Admitting: Unknown Physician Specialty

## 2017-02-18 DIAGNOSIS — R188 Other ascites: Secondary | ICD-10-CM | POA: Diagnosis not present

## 2017-02-18 DIAGNOSIS — K7031 Alcoholic cirrhosis of liver with ascites: Secondary | ICD-10-CM | POA: Diagnosis not present

## 2017-02-18 MED ORDER — ALBUMIN HUMAN 25 % IV SOLN
25.0000 g | Freq: Once | INTRAVENOUS | Status: AC
  Administered 2017-02-18: 25 g via INTRAVENOUS
  Filled 2017-02-18: qty 100

## 2017-02-18 MED ORDER — ALBUMIN HUMAN 25 % IV SOLN
INTRAVENOUS | Status: AC
  Filled 2017-02-18: qty 100

## 2017-02-18 NOTE — Progress Notes (Signed)
Albumin infusion complete. NS lock DC'd intact without complications at site. VSS. Pt. Assisted via w/c to [facility] transport . Stable for DC. Pt. Remains pleasantly confused; oriented to self and immediate surrouondings, year, month.

## 2017-02-18 NOTE — Progress Notes (Signed)
Pt. Received from Ultrasound via stretcher escort with US tech. Pt. Speech clear, but pt. Confused to surroundings. Aware of self, month , year. States random sentences. NS lock started by S. Manson PasseyBrown, RN; albumin began. [facility] transport called for pickup . Pt. Stable without any c/o at present.

## 2017-02-18 NOTE — Procedures (Signed)
Ultrasound-guided  therapeutic paracentesis performed yielding 2.1 liters of yellow fluid. No immediate complications.

## 2017-02-22 ENCOUNTER — Non-Acute Institutional Stay (SKILLED_NURSING_FACILITY): Payer: Commercial Managed Care - HMO | Admitting: Internal Medicine

## 2017-02-22 ENCOUNTER — Emergency Department (HOSPITAL_COMMUNITY): Payer: Medicare HMO

## 2017-02-22 ENCOUNTER — Inpatient Hospital Stay (HOSPITAL_COMMUNITY)
Admission: EM | Admit: 2017-02-22 | Discharge: 2017-03-11 | DRG: 432 | Disposition: E | Payer: Medicare HMO | Attending: Internal Medicine | Admitting: Internal Medicine

## 2017-02-22 ENCOUNTER — Encounter: Payer: Self-pay | Admitting: Internal Medicine

## 2017-02-22 ENCOUNTER — Other Ambulatory Visit: Payer: Self-pay | Admitting: *Deleted

## 2017-02-22 DIAGNOSIS — G0490 Encephalitis and encephalomyelitis, unspecified: Secondary | ICD-10-CM | POA: Diagnosis present

## 2017-02-22 DIAGNOSIS — Z96642 Presence of left artificial hip joint: Secondary | ICD-10-CM | POA: Diagnosis present

## 2017-02-22 DIAGNOSIS — F1010 Alcohol abuse, uncomplicated: Secondary | ICD-10-CM | POA: Diagnosis present

## 2017-02-22 DIAGNOSIS — T148XXA Other injury of unspecified body region, initial encounter: Secondary | ICD-10-CM | POA: Diagnosis not present

## 2017-02-22 DIAGNOSIS — K652 Spontaneous bacterial peritonitis: Secondary | ICD-10-CM | POA: Diagnosis present

## 2017-02-22 DIAGNOSIS — R4182 Altered mental status, unspecified: Secondary | ICD-10-CM | POA: Diagnosis present

## 2017-02-22 DIAGNOSIS — F0390 Unspecified dementia without behavioral disturbance: Secondary | ICD-10-CM | POA: Diagnosis present

## 2017-02-22 DIAGNOSIS — R52 Pain, unspecified: Secondary | ICD-10-CM

## 2017-02-22 DIAGNOSIS — G4733 Obstructive sleep apnea (adult) (pediatric): Secondary | ICD-10-CM | POA: Diagnosis present

## 2017-02-22 DIAGNOSIS — W19XXXA Unspecified fall, initial encounter: Secondary | ICD-10-CM

## 2017-02-22 DIAGNOSIS — J189 Pneumonia, unspecified organism: Secondary | ICD-10-CM

## 2017-02-22 DIAGNOSIS — R079 Chest pain, unspecified: Secondary | ICD-10-CM | POA: Diagnosis not present

## 2017-02-22 DIAGNOSIS — Z96652 Presence of left artificial knee joint: Secondary | ICD-10-CM | POA: Diagnosis present

## 2017-02-22 DIAGNOSIS — K7031 Alcoholic cirrhosis of liver with ascites: Secondary | ICD-10-CM

## 2017-02-22 DIAGNOSIS — Z9181 History of falling: Secondary | ICD-10-CM

## 2017-02-22 DIAGNOSIS — R410 Disorientation, unspecified: Secondary | ICD-10-CM

## 2017-02-22 DIAGNOSIS — I129 Hypertensive chronic kidney disease with stage 1 through stage 4 chronic kidney disease, or unspecified chronic kidney disease: Secondary | ICD-10-CM | POA: Diagnosis present

## 2017-02-22 DIAGNOSIS — S199XXA Unspecified injury of neck, initial encounter: Secondary | ICD-10-CM | POA: Diagnosis not present

## 2017-02-22 DIAGNOSIS — R451 Restlessness and agitation: Secondary | ICD-10-CM | POA: Diagnosis present

## 2017-02-22 DIAGNOSIS — K7290 Hepatic failure, unspecified without coma: Secondary | ICD-10-CM | POA: Diagnosis present

## 2017-02-22 DIAGNOSIS — E7800 Pure hypercholesterolemia, unspecified: Secondary | ICD-10-CM | POA: Diagnosis present

## 2017-02-22 DIAGNOSIS — S3992XA Unspecified injury of lower back, initial encounter: Secondary | ICD-10-CM | POA: Diagnosis not present

## 2017-02-22 DIAGNOSIS — M5489 Other dorsalgia: Secondary | ICD-10-CM | POA: Diagnosis not present

## 2017-02-22 DIAGNOSIS — G9341 Metabolic encephalopathy: Secondary | ICD-10-CM | POA: Diagnosis not present

## 2017-02-22 DIAGNOSIS — N189 Chronic kidney disease, unspecified: Secondary | ICD-10-CM | POA: Diagnosis present

## 2017-02-22 DIAGNOSIS — Z515 Encounter for palliative care: Secondary | ICD-10-CM | POA: Diagnosis not present

## 2017-02-22 DIAGNOSIS — F419 Anxiety disorder, unspecified: Secondary | ICD-10-CM | POA: Diagnosis not present

## 2017-02-22 DIAGNOSIS — I4891 Unspecified atrial fibrillation: Secondary | ICD-10-CM | POA: Diagnosis present

## 2017-02-22 DIAGNOSIS — N179 Acute kidney failure, unspecified: Secondary | ICD-10-CM | POA: Diagnosis present

## 2017-02-22 DIAGNOSIS — M545 Low back pain: Secondary | ICD-10-CM | POA: Diagnosis not present

## 2017-02-22 DIAGNOSIS — Z66 Do not resuscitate: Secondary | ICD-10-CM | POA: Diagnosis present

## 2017-02-22 LAB — CBC WITH DIFFERENTIAL/PLATELET
BASOS ABS: 0 10*3/uL (ref 0.0–0.1)
Basophils Relative: 0 %
EOS ABS: 0.1 10*3/uL (ref 0.0–0.7)
Eosinophils Relative: 1 %
HCT: 34.1 % — ABNORMAL LOW (ref 39.0–52.0)
Hemoglobin: 11.2 g/dL — ABNORMAL LOW (ref 13.0–17.0)
LYMPHS ABS: 1.8 10*3/uL (ref 0.7–4.0)
Lymphocytes Relative: 14 %
MCH: 32.6 pg (ref 26.0–34.0)
MCHC: 32.8 g/dL (ref 30.0–36.0)
MCV: 99.1 fL (ref 78.0–100.0)
Monocytes Absolute: 1.3 10*3/uL — ABNORMAL HIGH (ref 0.1–1.0)
Monocytes Relative: 10 %
NEUTROS ABS: 9.9 10*3/uL — AB (ref 1.7–7.7)
Neutrophils Relative %: 75 %
Platelets: 135 10*3/uL — ABNORMAL LOW (ref 150–400)
RBC: 3.44 MIL/uL — AB (ref 4.22–5.81)
RDW: 16.9 % — AB (ref 11.5–15.5)
WBC: 13.1 10*3/uL — AB (ref 4.0–10.5)

## 2017-02-22 LAB — COMPREHENSIVE METABOLIC PANEL
ALBUMIN: 2.5 g/dL — AB (ref 3.5–5.0)
ALK PHOS: 65 U/L (ref 38–126)
ALT: 23 U/L (ref 17–63)
AST: 49 U/L — AB (ref 15–41)
Anion gap: 11 (ref 5–15)
BILIRUBIN TOTAL: 5.3 mg/dL — AB (ref 0.3–1.2)
BUN: 22 mg/dL — AB (ref 6–20)
CALCIUM: 8.5 mg/dL — AB (ref 8.9–10.3)
CO2: 23 mmol/L (ref 22–32)
Chloride: 101 mmol/L (ref 101–111)
Creatinine, Ser: 1.51 mg/dL — ABNORMAL HIGH (ref 0.61–1.24)
GFR calc Af Amer: 52 mL/min — ABNORMAL LOW (ref >60)
GFR, EST NON AFRICAN AMERICAN: 45 mL/min — AB (ref >60)
GLUCOSE: 106 mg/dL — AB (ref 65–99)
Potassium: 3.7 mmol/L (ref 3.5–5.1)
Sodium: 135 mmol/L (ref 135–145)
TOTAL PROTEIN: 5.6 g/dL — AB (ref 6.5–8.1)

## 2017-02-22 LAB — PROTIME-INR
INR: 1.92
PROTHROMBIN TIME: 21.8 s — AB (ref 11.4–15.2)

## 2017-02-22 LAB — AMMONIA: AMMONIA: 28 umol/L (ref 9–35)

## 2017-02-22 LAB — APTT: aPTT: 40 seconds — ABNORMAL HIGH (ref 24–36)

## 2017-02-22 MED ORDER — LORAZEPAM 2 MG/ML IJ SOLN
1.0000 mg | Freq: Once | INTRAMUSCULAR | Status: AC
  Administered 2017-02-22: 1 mg via INTRAVENOUS
  Filled 2017-02-22: qty 1

## 2017-02-22 MED ORDER — CEFEPIME HCL 1 G IJ SOLR
1.0000 g | Freq: Once | INTRAMUSCULAR | Status: AC
  Administered 2017-02-23: 1 g via INTRAVENOUS
  Filled 2017-02-22: qty 1

## 2017-02-22 MED ORDER — SODIUM CHLORIDE 0.9 % IV BOLUS (SEPSIS)
1000.0000 mL | Freq: Once | INTRAVENOUS | Status: AC
  Administered 2017-02-23: 1000 mL via INTRAVENOUS

## 2017-02-22 MED ORDER — HALOPERIDOL LACTATE 5 MG/ML IJ SOLN
2.0000 mg | Freq: Once | INTRAMUSCULAR | Status: AC
  Administered 2017-02-23: 2 mg via INTRAVENOUS
  Filled 2017-02-22: qty 1

## 2017-02-22 MED ORDER — SODIUM CHLORIDE 0.9 % IV BOLUS (SEPSIS)
500.0000 mL | Freq: Once | INTRAVENOUS | Status: AC
  Administered 2017-02-22: 500 mL via INTRAVENOUS

## 2017-02-22 MED ORDER — IOPAMIDOL (ISOVUE-370) INJECTION 76%
INTRAVENOUS | Status: AC
  Filled 2017-02-22: qty 50

## 2017-02-22 NOTE — ED Notes (Signed)
ED Provider at bedside. 

## 2017-02-22 NOTE — Assessment & Plan Note (Signed)
If there is no response to increased doses of lactulose over 24 hours; Hospice should be reengaged He is DNR

## 2017-02-22 NOTE — ED Triage Notes (Signed)
Per EMS pt has had multiple recent fall with rib fx  And skin tear to right arm; pt is at baseline confused on arrival per EMS; no hx of dementia noted; pt c/o back pain; Pt is positive for c-diff from facility; Pt normally wears 2 L/M 02; Pt is DNR-Monique,RN

## 2017-02-22 NOTE — Assessment & Plan Note (Signed)
02/23/2017 speech garbled & unintelligible; jerking/involuntary motions of extremities Probable hepatic encephalopathy, lactulose will be increased and ammonia level checked

## 2017-02-22 NOTE — ED Provider Notes (Addendum)
[facility] EMERGENCY DEPARTMENT Provider Note   CSN: [id number] Arrival date & time: 02/21/2017  2057     History   Chief Complaint Chief Complaint  Patient presents with  . Fall    HPI Lawrence West is a 72 y.o. male.  HPI Patient presented to the emergency room from a nursing facility.  Per the EMS report the patient's had multiple recent falls and was complaining of back pain.  He was sent to the emergency room for further evaluation.  Patient is very confused and is unable to provide any history for me.  He mumbles but does not answer any questions. Past Medical History:  Diagnosis Date  . Arthritis    "left knee" (07/08/2015)  . Ascites   . Avascular necrosis of bone of left hip (HCC) 07/08/2015  . Brown recluse spider bite 1990s   "they thought I was gonna die then"  . Cirrhosis of liver (HCC)    Sees Dr. Sung Amabile  . Depression   . Edema of both legs    Takes Lasix  . Gout X 1  . High cholesterol   . Hypertension   . Narcolepsy   . Rheumatic fever   . Sleep apnea    pt stated "I have sleep apnea but I do not use the machine...Marland KitchenMarland KitchenMarland Kitchenit broke" (07/08/2015)    Patient Active Problem List   Diagnosis Date Noted  . Traumatic fracture of ribs with pneumothorax 02/15/2017  . Respiratory distress 11/06/2016  . Altered mental status 11/05/2016  . Confusion 06/18/2016  . Fall at home, initial encounter 06/18/2016  . Hepatic encephalopathy (HCC) 06/18/2016  . GIB (gastrointestinal bleeding) 12/27/2015  . Hyperlipidemia 07/23/2015  . Depression 07/23/2015  . Avascular necrosis of bone of left hip (HCC) 07/08/2015  . S/P total hip arthroplasty 07/08/2015  . Ascites due to alcoholic cirrhosis (HCC) 05/19/2015    Past Surgical History:  Procedure Laterality Date  . COSMETIC SURGERY  ~ 1997   "nose done, lip drop, chin lift"  . ESOPHAGOGASTRODUODENOSCOPY N/A 12/27/2015   Procedure: ESOPHAGOGASTRODUODENOSCOPY (EGD);  Surgeon: Charlott Rakes,  MD;  Location: [facility] ENDOSCOPY;  Service: Endoscopy;  Laterality: N/A;  . FOOT FRACTURE SURGERY Right   . FRACTURE SURGERY    . INGUINAL HERNIA REPAIR Bilateral 1990s  . TONSILLECTOMY  1954  . TOTAL HIP ARTHROPLASTY Left 07/08/2015   Procedure: LEFT TOTAL HIP ARTHROPLASTY;  Surgeon: Teryl Lucy, MD;  Location: [facility] OR;  Service: Orthopedics;  Laterality: Left;  . TOTAL KNEE ARTHROPLASTY Left 03/2008  . US GUIDED PARACENTESIS [facility] HX)  April 2017       Home Medications    Prior to Admission medications   Medication Sig Start Date End Date Taking? Authorizing Provider  Amino Acids-Protein Hydrolys (FEEDING SUPPLEMENT, PRO-STAT SUGAR FREE 64,) LIQD Take 30 mLs by mouth 2 (two) times daily.   Yes [provider]  atorvastatin (LIPITOR) 10 MG tablet Take 1 tablet (10 mg total) by mouth daily. 05/20/15  Yes Enid Baas, MD  bisacodyl (DULCOLAX) 10 MG suppository Place 10 mg rectally as needed for moderate constipation.   Yes [provider]  Camphor-Menthol-Methyl Sal (HM SALONPAS PAIN RELIEF) 1.2-5.7-6.3 % PTCH Apply topically daily. To left chest at site of greatest pain   Yes [provider]  furosemide (LASIX) 40 MG tablet Take 40 mg by mouth daily.   Yes [provider]  LORazepam (ATIVAN) 0.5 MG tablet Take 0.5 mg by mouth every 6 (six) hours as needed  for anxiety.   Yes [provider]  Magnesium 400 MG TABS Take 1 tablet by mouth 2 (two) times daily.   Yes [provider]  magnesium hydroxide (MILK OF MAGNESIA) 400 MG/5ML suspension Take 30 mLs by mouth daily as needed for mild constipation.   Yes [provider]  ondansetron (ZOFRAN) 4 MG tablet Take 4 mg by mouth every 6 (six) hours as needed for nausea or vomiting.   Yes [provider]  oxyCODONE (ROXICODONE) 15 MG immediate release tablet Take 7.5 mg by mouth every 4 (four) hours as needed for pain.    Yes [provider]  pantoprazole (PROTONIX)  40 MG tablet Take 1 tablet (40 mg total) by mouth 2 (two) times daily. 12/30/15  Yes Auburn Bilberry, MD  rifaximin (XIFAXAN) 550 MG TABS tablet Take 1 tablet (550 mg total) by mouth 2 (two) times daily. 06/19/16  Yes Sudini, Wardell Heath, MD  Sodium Phosphates (RA SALINE ENEMA) 19-7 GM/118ML ENEM Place 1 each rectally as needed (or constipation).   Yes [provider]  spironolactone (ALDACTONE) 25 MG tablet Take 1 tablet (25 mg total) by mouth daily. 12/30/15  Yes Auburn Bilberry, MD  Vancomycin HCl [facility]) 50 MG/ML SOLR Take 125 mg by mouth every 12 (twelve) hours. 02/19/17 02/27/17 Yes [provider]  Lactulose 20 GM/30ML SOLN Take 25 mLs by mouth every 4 (four) hours.     [provider]  OXYGEN Inhale 2 L/min into the lungs.    [provider]  Vancomycin HCl (FIRVANQ) 50 MG/ML SOLR Take 125 mg by mouth every other day. 03/04/17 03/10/17  [provider]    Family History Family History  Problem Relation Age of Onset  . Melanoma Father   . Lung cancer Father     Social History Social History   Tobacco Use  . Smoking status: Never Smoker  . Smokeless tobacco: Never Used  Substance Use Topics  . Alcohol use: Yes    Alcohol/week: 0.0 oz    Comment: 07/08/2015 former Heavy ETOH abuse.; "haven't had a drink in 2017; AA told me I didn't need to come back"  . Drug use: No    Comment: 07/08/2015 "just in my early 20s"     Allergies   Patient has no known allergies.   Review of Systems Review of Systems  Unable to perform ROS: Mental status change     Physical Exam Updated Vital Signs BP 100/60   Pulse 92   Resp 17   SpO2 96%   Physical Exam  Constitutional: No distress.  Chronically ill-appearing, agitated  HENT:  Head: Normocephalic and atraumatic.  Right Ear: External ear normal.  Left Ear: External ear normal.  Mucous membranes are dry  Eyes: Conjunctivae are normal. Right eye exhibits no discharge. Left eye exhibits no  discharge. No scleral icterus.  Neck: Neck supple. No tracheal deviation present.  Cardiovascular: Normal rate, regular rhythm and intact distal pulses.  Pulmonary/Chest: Effort normal and breath sounds normal. No stridor. No respiratory distress. He has no wheezes. He has no rales.  Abdominal: Soft. Bowel sounds are normal. He exhibits no distension. There is no tenderness. There is no rebound and no guarding.  Musculoskeletal: He exhibits no edema or tenderness.  Neurological: He displays tremor. No cranial nerve deficit (no facial droop, extraocular movements intact, no slurred speech) or sensory deficit. He exhibits abnormal muscle tone. He displays no seizure activity. GCS eye subscore is 3. GCS verbal subscore is 3. GCS motor  subscore is 5.  Patient moves all his extremities, he is constantly fidgeting in the bed, not follow commands  Skin: Skin is warm and dry. No rash noted.  Psychiatric: He has a normal mood and affect.  Nursing note and vitals reviewed.    ED Treatments / Results  Labs (all labs ordered are listed, but only abnormal results are displayed) Labs Reviewed  COMPREHENSIVE METABOLIC PANEL - Abnormal; Notable for the following components:      Result Value   Glucose, Bld 106 (*)    BUN 22 (*)    Creatinine, Ser 1.51 (*)    Calcium 8.5 (*)    Total Protein 5.6 (*)    Albumin 2.5 (*)    AST 49 (*)    Total Bilirubin 5.3 (*)    GFR calc non Af Amer 45 (*)    GFR calc Af Amer 52 (*)    All other components within normal limits  CBC WITH DIFFERENTIAL/PLATELET - Abnormal; Notable for the following components:   WBC 13.1 (*)    RBC 3.44 (*)    Hemoglobin 11.2 (*)    HCT 34.1 (*)    RDW 16.9 (*)    Platelets 135 (*)    Neutro Abs 9.9 (*)    Monocytes Absolute 1.3 (*)    All other components within normal limits  APTT - Abnormal; Notable for the following components:   aPTT 40 (*)    All other components within normal limits  PROTIME-INR - Abnormal; Notable for  the following components:   Prothrombin Time 21.8 (*)    All other components within normal limits  CULTURE, BLOOD (ROUTINE X 2)  CULTURE, BLOOD (ROUTINE X 2)  AMMONIA  URINALYSIS, COMPLETE (UACMP) WITH MICROSCOPIC  CBG MONITORING, ED  I-STAT ARTERIAL BLOOD GAS, ED      Radiology Dg Chest 1 View  Result Date: 23-Feb-2017 CLINICAL DATA:  72 year old male with fall and recent rib fractures. Chest pain. EXAM: CHEST 1 VIEW COMPARISON:  Chest radiograph dated 01/31/2017 FINDINGS: There is diffuse increased interstitial and hazy densities involving the right lung which may represent an asymmetric pulmonary edema versus pneumonia. Linear left lung base atelectasis/scarring noted. There is a small right pleural effusion. No pneumothorax. Top-normal cardiac silhouette. Atherosclerotic calcification of the aortic arch. There is osteopenia with degenerative changes of the spine. No acute osseous pathology. IMPRESSION: 1. Diffuse interstitial and hazy airspace density in the right lung with small right pleural effusion. Findings may represent asymmetric pulmonary edema versus pneumonia. Clinical correlation is recommended. 2. Atherosclerotic calcification of the aorta. Electronically Signed   By: Elgie Collard M.D.   On: 02/23/17 22:21   Dg Lumbar Spine 2-3 Views  Result Date: 02-23-17 CLINICAL DATA:  72 year old male with back pain.  Fall. EXAM: LUMBAR SPINE - 2-3 VIEW COMPARISON:  None. FINDINGS: Evaluation of the spine is limited due to patient's body habitus and soft tissue attenuation. No definite acute fracture identified on this limited exam. There is no subluxation. Multilevel old-appearing mild compression deformities with decreased vertebral body height. No listhesis. Slight straightening of the normal lumbar lordosis. The visualized posterior elements appear intact. Partially visualized left hip arthroplasty. Atherosclerotic calcification of the aorta. IMPRESSION: No definite acute/traumatic  lumbar spine pathology on limited evaluation. Electronically Signed   By: Elgie Collard M.D.   On: 2017/02/23 22:23    Procedures Procedures (including critical care time)  Medications Ordered in ED Medications  iopamidol (ISOVUE-370) 76 % injection (not administered)  sodium chloride  0.9 % bolus 1,000 mL (not administered)  ceFEPIme (MAXIPIME) 1 g in dextrose 5 % 50 mL IVPB (not administered)  haloperidol lactate (HALDOL) injection 2 mg (not administered)  LORazepam (ATIVAN) injection 1 mg (1 mg Intravenous Given 02/21/2017 2323)  sodium chloride 0.9 % bolus 500 mL (500 mLs Intravenous New Bag/Given 02/27/2017 2323)     Initial Impression / Assessment and Plan / ED Course  I have reviewed the triage vital signs and the nursing notes.  Pertinent labs & imaging results that were available during my care of the patient were reviewed by me and considered in my medical decision making (see chart for details).  Clinical Course as of Jan 16 0002  Tue Feb 22, 2017  2234 Patient was unable to remain still for the CT scan.   [JK]  2234 CXR shows possible pna.  Will start abx to cover for hcap  [JK]  2346 Patient remains confused and agitated.  Additional history provided by family members.  This behavior is not typical for him but he has been getting worse in the last few days  [JK]  2347 BUN, creatinine and bilirubin are elevated compared to 3 weeks ago  [JK]    Clinical Course User Index [JK] Linwood DibblesKnapp, Luster Hechler, MD    Patient presented from the nursing home for altered mental status, fall and delirium.  Patient has history of alcohol abuse and cirrhosis according to the medical records.  Patient is very confused here and is not able to speak or communicate.  Family states this is new over the last few days.  CT scan is pending because the patient has not been able to lie still.  I have ordered some Ativan and Haldol to facilitate the study.  Chest x-ray does suggest pneumonia.  IV antibiotics have  been ordered.  Anticipate hospitalization once his CT scan is complete.  Dr Wilkie AyeHorton will follow up on the CT  Final Clinical Impressions(s) / ED Diagnoses   Final diagnoses:  HCAP (healthcare-associated pneumonia)  Delirium  Metabolic encephalopathy    ED Discharge Orders    None       Linwood DibblesKnapp, Shelah Heatley, MD 02/23/17 0007  ABG reviewed.  Suspect venous sample.   Linwood DibblesKnapp, Lailah Marcelli, MD 02/23/17 Burna [id number]

## 2017-02-22 NOTE — Progress Notes (Signed)
   NURSING HOME LOCATION:  [facility] ROOM NUMBER:  [id number]  CODE STATUS:  DNR  PCP:  [name], [name], MD  [phone] [facility] [place] [facility] [place] [id number]  This is a nursing facility follow up for specific acute issue of pallor & jerking limbs.  Interim medical record and care since [facility] was updated with review of diagnostic studies and change in clinical status since last visit were documented.  HPI: Staff reports the patient appears pale and has exhibited jerking movements of the extremities. Vital signs however are stable. Last night he was wandering and ambulated to the nurses station. Hemoglobin was 12.2 and hematocrit 36 on 1/9. He exhibited a relatively stable leukocytosis with a white count of 11,800. Extensive chemistries were last checked 12/24. AST had been mildly elevated up to 57 but was stable over time. ALTs have been normal. Renal function was normal. 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 Hospice care at home was rescinded to [facility].  Review of systems: Acute encephalopathic picture prevented any review of systems completion.  Physical exam:  Pertinent or positive findings: Hair is disheveled, he has a full beard and mustache. Exhibits an unfocused stare looking in different directions without purpose. Mouth is agape. Tongue is dry. Speech is garbled and unintelligible as noted. He exhibits involuntary jerking of his extremities. Extremity movements are intermittently athetoid but always purposeless. He was able to follow commands to test strength in extremities. Strength was surprisingly good. Heart sounds are distant. He has low-grade diffuse rales. Abdomen is soft but protuberant. Knees are fusiformly enlarged. Dorsalis pedis pulses are decreased. Posterior tibial pulses are palpable. The toenails are  thickened and deformed. He has bruising over his forearms. Pallor is not clinically present.  General appearance:no increased work of breathing is present.   Lymphatic: No lymphadenopathy about the head, neck, axilla . Eyes: No conjunctival inflammation or lid edema is present. There is no scleral icterus. Ears:  External ear exam shows no significant lesions or deformities.   Nose:  External nasal examination shows no deformity or inflammation. Nasal mucosa are pink and moist without lesions ,exudates Oral exam: lips and gums are healthy appearing.There is no oropharyngeal erythema or exudate . Neck:  No thyromegaly, masses, tenderness noted.    Lungs: without wheezes, rhonchi , rubs. Abdomen: Abdomen is soft and nontender with no  hernias,masses. GU: deferred  Extremities:  No cyanosis, clubbing,edema  Neurologic exam : Balance,Rhomberg,finger to nose testing could not be completed due to clinical state Skin: Warm & dry w/o tenting. No significant lesions or rash.  See summary under each active problem in the Problem List with associated updated therapeutic plan

## 2017-02-22 NOTE — Progress Notes (Signed)
Attempted to try and get arterial blood gas with RN assistance pt is all over bed and jerking its unsafe at this time.  Pt has been given some ativan I will attempt again later.

## 2017-02-22 NOTE — Patient Instructions (Signed)
See assessment and plan under each diagnosis in the problem list and acutely for this visit 

## 2017-02-23 ENCOUNTER — Inpatient Hospital Stay (HOSPITAL_COMMUNITY): Payer: Medicare HMO

## 2017-02-23 DIAGNOSIS — I129 Hypertensive chronic kidney disease with stage 1 through stage 4 chronic kidney disease, or unspecified chronic kidney disease: Secondary | ICD-10-CM | POA: Diagnosis present

## 2017-02-23 DIAGNOSIS — K652 Spontaneous bacterial peritonitis: Secondary | ICD-10-CM | POA: Diagnosis present

## 2017-02-23 DIAGNOSIS — G4733 Obstructive sleep apnea (adult) (pediatric): Secondary | ICD-10-CM | POA: Diagnosis present

## 2017-02-23 DIAGNOSIS — W19XXXA Unspecified fall, initial encounter: Secondary | ICD-10-CM | POA: Insufficient documentation

## 2017-02-23 DIAGNOSIS — R451 Restlessness and agitation: Secondary | ICD-10-CM | POA: Diagnosis present

## 2017-02-23 DIAGNOSIS — K7031 Alcoholic cirrhosis of liver with ascites: Principal | ICD-10-CM

## 2017-02-23 DIAGNOSIS — K7290 Hepatic failure, unspecified without coma: Secondary | ICD-10-CM | POA: Diagnosis present

## 2017-02-23 DIAGNOSIS — R4182 Altered mental status, unspecified: Secondary | ICD-10-CM | POA: Diagnosis present

## 2017-02-23 DIAGNOSIS — R410 Disorientation, unspecified: Secondary | ICD-10-CM | POA: Diagnosis not present

## 2017-02-23 DIAGNOSIS — N189 Chronic kidney disease, unspecified: Secondary | ICD-10-CM | POA: Diagnosis present

## 2017-02-23 DIAGNOSIS — F1010 Alcohol abuse, uncomplicated: Secondary | ICD-10-CM | POA: Diagnosis present

## 2017-02-23 DIAGNOSIS — J189 Pneumonia, unspecified organism: Secondary | ICD-10-CM | POA: Diagnosis not present

## 2017-02-23 DIAGNOSIS — Z96642 Presence of left artificial hip joint: Secondary | ICD-10-CM | POA: Diagnosis present

## 2017-02-23 DIAGNOSIS — Z515 Encounter for palliative care: Secondary | ICD-10-CM | POA: Diagnosis present

## 2017-02-23 DIAGNOSIS — E7800 Pure hypercholesterolemia, unspecified: Secondary | ICD-10-CM | POA: Diagnosis present

## 2017-02-23 DIAGNOSIS — Z66 Do not resuscitate: Secondary | ICD-10-CM | POA: Diagnosis present

## 2017-02-23 DIAGNOSIS — G9341 Metabolic encephalopathy: Secondary | ICD-10-CM | POA: Diagnosis present

## 2017-02-23 DIAGNOSIS — N179 Acute kidney failure, unspecified: Secondary | ICD-10-CM | POA: Diagnosis present

## 2017-02-23 DIAGNOSIS — Z96652 Presence of left artificial knee joint: Secondary | ICD-10-CM | POA: Diagnosis present

## 2017-02-23 DIAGNOSIS — I4891 Unspecified atrial fibrillation: Secondary | ICD-10-CM | POA: Diagnosis present

## 2017-02-23 DIAGNOSIS — G0490 Encephalitis and encephalomyelitis, unspecified: Secondary | ICD-10-CM | POA: Diagnosis present

## 2017-02-23 DIAGNOSIS — F0390 Unspecified dementia without behavioral disturbance: Secondary | ICD-10-CM | POA: Diagnosis present

## 2017-02-23 DIAGNOSIS — Z9181 History of falling: Secondary | ICD-10-CM | POA: Diagnosis not present

## 2017-02-23 LAB — I-STAT ARTERIAL BLOOD GAS, ED
Acid-Base Excess: 2 mmol/L (ref 0.0–2.0)
BICARBONATE: 26.5 mmol/L (ref 20.0–28.0)
O2 Saturation: 68 %
Patient temperature: 98
TCO2: 28 mmol/L (ref 22–32)
pCO2 arterial: 40.6 mmHg (ref 32.0–48.0)
pH, Arterial: 7.421 (ref 7.350–7.450)
pO2, Arterial: 34 mmHg — CL (ref 83.0–108.0)

## 2017-02-23 LAB — URINALYSIS, COMPLETE (UACMP) WITH MICROSCOPIC
BACTERIA UA: NONE SEEN
BILIRUBIN URINE: NEGATIVE
GLUCOSE, UA: NEGATIVE mg/dL
HGB URINE DIPSTICK: NEGATIVE
Ketones, ur: NEGATIVE mg/dL
LEUKOCYTES UA: NEGATIVE
NITRITE: NEGATIVE
PROTEIN: NEGATIVE mg/dL
Specific Gravity, Urine: 1.017 (ref 1.005–1.030)
Squamous Epithelial / LPF: NONE SEEN
pH: 5 (ref 5.0–8.0)

## 2017-02-23 LAB — PROCALCITONIN: Procalcitonin: 0.42 ng/mL

## 2017-02-23 LAB — FOLATE: Folate: 15.2 ng/mL (ref >5.9)

## 2017-02-23 LAB — TSH: TSH: 0.824 u[IU]/mL (ref 0.350–4.500)

## 2017-02-23 LAB — ETHANOL: Alcohol, Ethyl (B): <10 mg/dL (ref <10)

## 2017-02-23 LAB — VITAMIN B12: Vitamin B-12: 1609 pg/mL — ABNORMAL HIGH (ref 180–914)

## 2017-02-23 LAB — HIV ANTIBODY (ROUTINE TESTING W REFLEX): HIV Screen 4th Generation wRfx: NONREACTIVE

## 2017-02-23 MED ORDER — BIOTENE DRY MOUTH MT LIQD: 15.0000 mL | OROMUCOSAL | Status: DC | PRN

## 2017-02-23 MED ORDER — GLYCOPYRROLATE 0.2 MG/ML IJ SOLN: 0.2000 mg | INTRAMUSCULAR | Status: DC | PRN

## 2017-02-23 MED ORDER — SODIUM CHLORIDE 0.9 % IV SOLN
10.0000 mg/h | INTRAVENOUS | Status: DC
  Administered 2017-02-23: 10 mg/h via INTRAVENOUS
  Filled 2017-02-23: qty 10

## 2017-02-23 MED ORDER — POLYVINYL ALCOHOL 1.4 % OP SOLN
1.0000 [drp] | Freq: Four times a day (QID) | OPHTHALMIC | Status: DC | PRN
  Filled 2017-02-23: qty 15

## 2017-02-23 MED ORDER — SODIUM CHLORIDE 0.9 % IV SOLN
6.0000 mg/h | INTRAVENOUS | Status: DC
  Administered 2017-02-23: 5 mg/h via INTRAVENOUS
  Administered 2017-02-23: 6 mg/h via INTRAVENOUS
  Filled 2017-02-23 (×2): qty 10

## 2017-02-23 MED ORDER — SODIUM CHLORIDE 0.9 % IV SOLN
INTRAVENOUS | Status: DC
  Administered 2017-02-23: 600 mL via INTRAVENOUS

## 2017-02-23 MED ORDER — HALOPERIDOL LACTATE 2 MG/ML PO CONC
0.5000 mg | ORAL | Status: DC | PRN
  Filled 2017-02-23: qty 0.3

## 2017-02-23 MED ORDER — OLANZAPINE 5 MG PO TBDP
5.0000 mg | ORAL_TABLET | Freq: Every day | ORAL | Status: DC
  Administered 2017-02-23: 5 mg via ORAL
  Filled 2017-02-23: qty 1

## 2017-02-23 MED ORDER — LORAZEPAM 2 MG/ML IJ SOLN: 2.0000 mg/h | INTRAVENOUS | Status: DC

## 2017-02-23 MED ORDER — VANCOMYCIN HCL IN DEXTROSE 1-5 GM/200ML-% IV SOLN
1000.0000 mg | Freq: Once | INTRAVENOUS | Status: AC
  Administered 2017-02-23: 1000 mg via INTRAVENOUS
  Filled 2017-02-23: qty 200

## 2017-02-23 MED ORDER — VANCOMYCIN HCL 500 MG IV SOLR
500.0000 mg | Freq: Four times a day (QID) | Status: DC
  Administered 2017-02-23: 500 mg via RECTAL
  Filled 2017-02-23 (×2): qty 500

## 2017-02-23 MED ORDER — ONDANSETRON HCL 4 MG/2ML IJ SOLN: 4.0000 mg | Freq: Four times a day (QID) | INTRAMUSCULAR | Status: DC | PRN

## 2017-02-23 MED ORDER — LORAZEPAM 2 MG/ML IJ SOLN
2.0000 mg | INTRAMUSCULAR | Status: AC
  Filled 2017-02-23: qty 1

## 2017-02-23 MED ORDER — LORAZEPAM 2 MG/ML IJ SOLN: 1.0000 mg | INTRAMUSCULAR | Status: DC | PRN

## 2017-02-23 MED ORDER — LORAZEPAM 2 MG/ML IJ SOLN
1.0000 mg | Freq: Once | INTRAMUSCULAR | Status: AC
  Administered 2017-02-23: 1 mg via INTRAVENOUS
  Filled 2017-02-23: qty 1

## 2017-02-23 MED ORDER — HYDROMORPHONE BOLUS VIA INFUSION
3.0000 mg | INTRAVENOUS | Status: DC | PRN
  Filled 2017-02-23: qty 6

## 2017-02-23 MED ORDER — HYDROMORPHONE BOLUS VIA INFUSION
2.0000 mg | INTRAVENOUS | Status: DC | PRN
  Administered 2017-02-23: 2 mg via INTRAVENOUS
  Filled 2017-02-23: qty 4

## 2017-02-23 MED ORDER — HALOPERIDOL 1 MG PO TABS: 0.5000 mg | ORAL_TABLET | ORAL | Status: DC | PRN

## 2017-02-23 MED ORDER — HEPARIN SODIUM (PORCINE) 5000 UNIT/ML IJ SOLN: 5000.0000 [IU] | Freq: Three times a day (TID) | INTRAMUSCULAR | Status: DC

## 2017-02-23 MED ORDER — LORAZEPAM 2 MG/ML IJ SOLN
2.0000 mg/h | INTRAVENOUS | Status: DC
  Administered 2017-02-23: 2 mg/h via INTRAVENOUS
  Filled 2017-02-23: qty 25

## 2017-02-23 MED ORDER — LORAZEPAM 2 MG/ML IJ SOLN: 1.0000 mg | Freq: Four times a day (QID) | INTRAMUSCULAR | Status: DC

## 2017-02-23 MED ORDER — SODIUM CHLORIDE 0.9 % IV SOLN: 250.0000 mL | INTRAVENOUS | Status: DC | PRN

## 2017-02-23 MED ORDER — LORAZEPAM BOLUS VIA INFUSION
2.0000 mg | INTRAVENOUS | Status: DC | PRN
  Filled 2017-02-23: qty 2

## 2017-02-23 MED ORDER — HALOPERIDOL LACTATE 5 MG/ML IJ SOLN: 0.5000 mg | INTRAMUSCULAR | Status: DC | PRN

## 2017-02-23 MED ORDER — LORAZEPAM 2 MG/ML PO CONC: 1.0000 mg | Freq: Four times a day (QID) | ORAL | Status: DC

## 2017-02-23 MED ORDER — ONDANSETRON 4 MG PO TBDP: 4.0000 mg | ORAL_TABLET | Freq: Four times a day (QID) | ORAL | Status: DC | PRN

## 2017-02-23 MED ORDER — MORPHINE BOLUS VIA INFUSION
5.0000 mg | INTRAVENOUS | Status: DC | PRN
  Filled 2017-02-23: qty 20

## 2017-02-23 MED ORDER — MORPHINE BOLUS VIA INFUSION: 2.0000 mg | INTRAVENOUS | Status: DC | PRN

## 2017-02-23 MED ORDER — DEXTROSE 5 % IV SOLN
1.0000 g | Freq: Three times a day (TID) | INTRAVENOUS | Status: DC
  Filled 2017-02-23 (×2): qty 1

## 2017-02-23 MED ORDER — ACETAMINOPHEN 325 MG PO TABS: 650.0000 mg | ORAL_TABLET | Freq: Four times a day (QID) | ORAL | Status: DC | PRN

## 2017-02-23 MED ORDER — HYDROMORPHONE HCL 1 MG/ML IJ SOLN: 1.0000 mg | INTRAMUSCULAR | Status: AC | PRN

## 2017-02-23 MED ORDER — ACETAMINOPHEN 650 MG RE SUPP: 650.0000 mg | Freq: Four times a day (QID) | RECTAL | Status: DC | PRN

## 2017-02-23 MED ORDER — LORAZEPAM 1 MG PO TABS: 1.0000 mg | ORAL_TABLET | Freq: Four times a day (QID) | ORAL | Status: DC

## 2017-02-23 MED ORDER — VANCOMYCIN HCL IN DEXTROSE 750-5 MG/150ML-% IV SOLN
750.0000 mg | Freq: Two times a day (BID) | INTRAVENOUS | Status: DC
  Filled 2017-02-23: qty 150

## 2017-02-23 MED ORDER — LORAZEPAM 2 MG/ML IJ SOLN
2.0000 mg | Freq: Once | INTRAMUSCULAR | Status: AC
  Administered 2017-02-23: 2 mg via INTRAVENOUS
  Filled 2017-02-23: qty 1

## 2017-02-23 MED ORDER — HALOPERIDOL LACTATE 5 MG/ML IJ SOLN
5.0000 mg | Freq: Once | INTRAMUSCULAR | Status: AC
  Administered 2017-02-23: 5 mg via INTRAVENOUS
  Filled 2017-02-23: qty 1

## 2017-02-23 MED ORDER — GLYCOPYRROLATE 1 MG PO TABS
1.0000 mg | ORAL_TABLET | ORAL | Status: DC | PRN
  Filled 2017-02-23: qty 1

## 2017-02-23 NOTE — Progress Notes (Signed)
Called by bedside RN.  Patient is very confused and very agitated.  Vital signs are stable but he is visibly in pain.  Spoke with the patient's sister.  After an extensive discussion, patient is a full DNR.  They wish for him to be comfortable with no further treatment.  Will start morphine drip and [facility] today.  Orders entered.  The patient is critically ill with multiple organ systems failure and requires high complexity decision making for assessment and support, frequent evaluation and titration of therapies, application of advanced monitoring technologies and extensive interpretation of multiple databases.   Critical Care Time devoted to patient care services described in this note is  45  Minutes. This time reflects time of care of this signee Dr Koren BoundWesam Romanda Turrubiates. This critical care time does not reflect procedure time, or teaching time or supervisory time of PA/NP/Med student/Med Resident etc but could involve care discussion time.  Alyson ReedyWesam G. Orlandus Borowski, M.D. [facility]/Critical [facility] Medicine. Pager: [phone]. After hours pager: [phone].

## 2017-02-23 NOTE — ED Notes (Signed)
Admitting Provider at bedside. 

## 2017-02-23 NOTE — Progress Notes (Signed)
Pt arrived to unit at approximately 1300 from the ED via stretcher.  He was transferred from stretcher to bed.   Pt had PIV to left wrist that was noted to be be leaking, where a new PIV was started to the left A/C Pt tolerated Procedure well. Pt was turned to remove the sheets from underneath him.  Tolerating procedure well, with increase breaths, where at that time he received bolus of his dilaudid by the request of the Palliative RN.  Pt then was resting comfortably.

## 2017-02-23 NOTE — ED Provider Notes (Addendum)
4:30 AM   Patient signed out pending CT scan.  Unfortunately, patient has become progressively more agitated.  He initially received Ativan and 2 mg of Haldol by my colleague.  These did not improve his mental status.  He subsequently received an additional dose of Ativan, Zyprexa, and 5 mg of Haldol.  He ultimately required soft restraints.  He remains very agitated.  Vital signs do not suggest any evidence of withdrawal.  He appears very encephalopathic.  At this time I do not feel I can safely get a CT scan.  He has no external signs of trauma on the head.  Discussed with the hospitalist.  Requesting hospitalist evaluation given pneumonia and delirium.  EKG Interpretation  Date/Time:  Wednesday February 23 2017 05:49:43 EST Ventricular Rate:  106 PR Interval:    QRS Duration: 97 QT Interval:  336 QTC Calculation: 447 R Axis:   -38 Text Interpretation:  Sinus tachycardia Left axis deviation Low voltage, precordial leads Confirmed by Ross MarcusHorton, Lashya Passe ([id number]) on 02/23/2017 5:58:00 AM   CRITICAL CARE Performed by: Shon BatonHORTON, Hisako Bugh F   Total critical care time: 25 minutes  Critical care time was exclusive of separately billable procedures and treating other patients.  Critical care was necessary to treat or prevent imminent or life-threatening deterioration.  Critical care was time spent personally by me on the following activities: development of treatment plan with patient and/or surrogate as well as nursing, discussions with consultants, evaluation of patient's response to treatment, examination of patient, obtaining history from patient or surrogate, ordering and performing treatments and interventions, ordering and review of laboratory studies, ordering and review of radiographic studies, pulse oximetry and re-evaluation of patient's condition.    Shon BatonHorton, Jomarie Gellis F, MD 02/23/17 [id number]    Shon BatonHorton, Prentis Langdon F, MD 02/23/17 [phone]

## 2017-02-23 NOTE — Progress Notes (Signed)
Pharmacy Antibiotic Note  Lawrence West is a 72 y.o. male admitted on 03/08/2017 with pneumonia.  Pharmacy has been consulted for Vancomycin dosing. WBC 13.1. Noted renal dysfunction.   Plan: -Vancomycin 750 mg IV q12h -Cefepime give x 1 in the ED, f/u additional gram negative coverage -Trend WBC, temp, renal function  -F/U infectious work-up -Drug levels as indicated  Temp (24hrs), Avg:97.1 F (36.2 C), Min:97.1 F (36.2 C), Max:97.1 F (36.2 C)  Recent Labs  Lab 02/21/2017 2120  WBC 13.1*  CREATININE 1.51*    Estimated Creatinine Clearance: 53.8 mL/min (A) (by C-G formula based on SCr of 1.51 mg/dL (H)).    No Known Allergies  Abran DukeLedford, Viviene Thurston 02/23/2017 3:28 AM

## 2017-02-23 NOTE — Progress Notes (Signed)
Wasted 172 mg/ml morphine with charge Nurse Lyla SonCarrie this day when morphine was discontinue and Dilaudid was started on Pt.

## 2017-02-23 NOTE — H&P (Signed)
PULMONARY / CRITICAL CARE MEDICINE   Name: Lawrence West MRN: [id number] DOB: 1945-08-15    ADMISSION DATE:  03-16-2017 CONSULTATION DATE:  02/23/2017  REFERRING MD:  Ross [name], MD  CHIEF COMPLAINT:  Encephalopathy and falls  HISTORY OF PRESENT ILLNESS:   Due to the patients encephalopathy he was unable to contribute to the HPI. As per medical record and nursing staff, the patient has experienced multiple falls recently with complaint of back pain. He has a PMHx significant for alcoholic cirrhosis of the liver, EtOH abuse, ascites, HTN, sleep apnea and narcolepsy. The patient displays baseline dementia but he was able to converse although with notable mild confusion as per record review as of 01/08. At that time a SLUMS mental evaluation score of 19/30 was obtained.   Admitted and discharged from [facility] from 01/31/2017-02/14/2017 after falling against a toilet. He was found to have bilateral pneumothoraces and pneumomediastinum with displaced fractures of the posterior left 7-9th ribs and nondisplaced fracture of posterior 10-12th ribs. Left chest tube placed on that admission but removed on 12/31. He has end stage liver disease that requires weekly paracentesis (see Dr. Frederik Pear note from 1/08 for more information regarding this admission.)  PAST MEDICAL HISTORY :  He  has a past medical history of Arthritis, Ascites, Avascular necrosis of bone of left hip (HCC) (07/08/2015), Brown recluse spider bite (1990s), Cirrhosis of liver (HCC), Depression, Edema of both legs, Gout (X 1), High cholesterol, Hypertension, Narcolepsy, Rheumatic fever, and Sleep apnea.  PAST SURGICAL HISTORY: He  has a past surgical history that includes Cosmetic surgery (~ 1997); Total knee arthroplasty (Left, 03/2008); US GUIDED PARACENTESIS [facility] HX) (April 2017); Tonsillectomy (1954); Inguinal hernia repair (Bilateral, 1990s); Foot fracture surgery (Right); Fracture surgery; Total hip arthroplasty (Left, 07/08/2015);  and Esophagogastroduodenoscopy (N/A, 12/27/2015).  No Known Allergies  No current facility-administered medications on file prior to encounter.    Current Outpatient Medications on File Prior to Encounter  Medication Sig  . Amino Acids-Protein Hydrolys (FEEDING SUPPLEMENT, PRO-STAT SUGAR FREE 64,) LIQD Take 30 mLs by mouth 2 (two) times daily.  Marland Kitchen atorvastatin (LIPITOR) 10 MG tablet Take 1 tablet (10 mg total) by mouth daily.  . bisacodyl (DULCOLAX) 10 MG suppository Place 10 mg rectally as needed for moderate constipation.  . Camphor-Menthol-Methyl Sal (HM SALONPAS PAIN RELIEF) 1.2-5.7-6.3 % PTCH Apply topically daily. To left chest at site of greatest pain  . furosemide (LASIX) 40 MG tablet Take 40 mg by mouth daily.  Marland Kitchen LORazepam (ATIVAN) 0.5 MG tablet Take 0.5 mg by mouth every 6 (six) hours as needed for anxiety.  . Magnesium 400 MG TABS Take 1 tablet by mouth 2 (two) times daily.  . magnesium hydroxide (MILK OF MAGNESIA) 400 MG/5ML suspension Take 30 mLs by mouth daily as needed for mild constipation.  . ondansetron (ZOFRAN) 4 MG tablet Take 4 mg by mouth every 6 (six) hours as needed for nausea or vomiting.  Marland Kitchen oxyCODONE (ROXICODONE) 15 MG immediate release tablet Take 7.5 mg by mouth every 4 (four) hours as needed for pain.   . pantoprazole (PROTONIX) 40 MG tablet Take 1 tablet (40 mg total) by mouth 2 (two) times daily.  . rifaximin (XIFAXAN) 550 MG TABS tablet Take 1 tablet (550 mg total) by mouth 2 (two) times daily.  . Sodium Phosphates (RA SALINE ENEMA) 19-7 GM/118ML ENEM Place 1 each rectally as needed (or constipation).  Marland Kitchen spironolactone (ALDACTONE) 25 MG tablet Take 1 tablet (25 mg total) by mouth daily.  Marland Kitchen  Vancomycin HCl (FIRVANQ) 50 MG/ML SOLR Take 125 mg by mouth every 12 (twelve) hours.  . Lactulose 20 GM/30ML SOLN Take 25 mLs by mouth every 4 (four) hours.   . OXYGEN Inhale 2 L/min into the lungs.  Melene Muller ON 03/04/2017] Vancomycin HCl (FIRVANQ) 50 MG/ML SOLR Take 125 mg by  mouth every other day.    FAMILY HISTORY:  His indicated that his mother is alive. He indicated that his father is deceased.  SOCIAL HISTORY: He  reports that  has never smoked. he has never used smokeless tobacco. He reports that he drinks alcohol. He reports that he does not use drugs.  REVIEW OF SYSTEMS:   Unable to perform, patient does not respond to commands.   SUBJECTIVE:  Patient responds to verbal commands only with agitation. There are no focal responses noted.   VITAL SIGNS: BP 118/60   Pulse (!) 106   Resp (!) 32   SpO2 93%   HEMODYNAMICS:    VENTILATOR SETTINGS: FiO2 (%):  [97 %] 97 %  INTAKE / OUTPUT: No intake/output data recorded.  PHYSICAL EXAMINATION: General:  In no acute distress, lying in bed, restrained due to atypical movement of the extremities Neuro:  Patient not responsive to commands, agitated, GCS 8(localizes response to pain, does not open eyes, utters incomprehensible sounds) HEENT:  Pinpoint pupils, equal, not able to determine response to light. Oral cavity appears moist Cardiovascular:  Tachycardic, rhythm irregular, S1-S2 difficult to auscultate  Lungs:  Lungs clear to ascultation bilaterally, no wheezing or rhonchi, agonal breathing noted on exam  Abdomen:  Soft, mildly distended, bowel sounds present Musculoskeletal:  Mild pedal edema bilaterally, notable scar over left knee Skin:  Warm and dry to touch  LABS:  BMET Recent Labs  Lab 03/03/2017 2120  NA 135  K 3.7  CL 101  CO2 23  BUN 22*  CREATININE 1.51*  GLUCOSE 106*    Electrolytes Recent Labs  Lab 03/02/2017 2120  CALCIUM 8.5*    CBC Recent Labs  Lab 02/25/2017 2120  WBC 13.1*  HGB 11.2*  HCT 34.1*  PLT 135*    Coag's Recent Labs  Lab 03/03/2017 2120  APTT 40*  INR 1.92    Sepsis Markers No results for input(s): LATICACIDVEN, PROCALCITON, O2SATVEN in the last 168 hours.  ABG Recent Labs  Lab 02/23/17 0007  PHART 7.421  PCO2ART 40.6  PO2ART 34.0*     Liver Enzymes Recent Labs  Lab 03/09/2017 2120  AST 49*  ALT 23  ALKPHOS 65  BILITOT 5.3*  ALBUMIN 2.5*    Cardiac Enzymes No results for input(s): TROPONINI, PROBNP in the last 168 hours.  Glucose No results for input(s): GLUCAP in the last 168 hours.  Imaging Dg Chest 1 View  Result Date: 03/09/2017 CLINICAL DATA:  72 year old male with fall and recent rib fractures. Chest pain. EXAM: CHEST 1 VIEW COMPARISON:  Chest radiograph dated 01/31/2017 FINDINGS: There is diffuse increased interstitial and hazy densities involving the right lung which may represent an asymmetric pulmonary edema versus pneumonia. Linear left lung base atelectasis/scarring noted. There is a small right pleural effusion. No pneumothorax. Top-normal cardiac silhouette. Atherosclerotic calcification of the aortic arch. There is osteopenia with degenerative changes of the spine. No acute osseous pathology. IMPRESSION: 1. Diffuse interstitial and hazy airspace density in the right lung with small right pleural effusion. Findings may represent asymmetric pulmonary edema versus pneumonia. Clinical correlation is recommended. 2. Atherosclerotic calcification of the aorta. Electronically Signed   By: Burtis Junes  Radparvar M.D.   On: 03/10/2017 22:21   Dg Lumbar Spine 2-3 Views  Result Date: 03/01/2017 CLINICAL DATA:  72 year old male with back pain.  Fall. EXAM: LUMBAR SPINE - 2-3 VIEW COMPARISON:  None. FINDINGS: Evaluation of the spine is limited due to patient's body habitus and soft tissue attenuation. No definite acute fracture identified on this limited exam. There is no subluxation. Multilevel old-appearing mild compression deformities with decreased vertebral body height. No listhesis. Slight straightening of the normal lumbar lordosis. The visualized posterior elements appear intact. Partially visualized left hip arthroplasty. Atherosclerotic calcification of the aorta. IMPRESSION: No definite acute/traumatic lumbar  spine pathology on limited evaluation. Electronically Signed   By: Elgie Collard M.D.   On: 02/25/2017 22:23   STUDIES:  Unable to perform CT head as patient constantly moves ECG: sinus tachycardia with mild L-axis deviation   CULTURES: Blood Cx 01/16>>>pending Urine Cx 01/16>>>needs collected  ANTIBIOTICS: Cefepime 1/16>>> Vancomycin 1/16>>>  SIGNIFICANT EVENTS: Transferred to ICU   LINES/TUBES: PIV   DISCUSSION: 19 yoM who presented with worsening encephalopathy and multiple falls over the past few days since being admitted to a SNF on 01/07 after discharge from [facility] for injury sustained from a fall. He is unable to provide a history. Given his altered mental status, agitation with focus and unresponsiveness to multiple doses of haloperidol and ativan he is being admitted to the ICU.  ASSESSMENT / PLAN:  PULMONARY A: Patient's SpO2 is 97% on 5L via [place] ABG 7.42/40.6/34 P:   Continue current treatment, patient is DNR, may consider non-invasive ventilation as indicated  CARDIOVASCULAR A:  ECG unremarkable No echo on file Patient in A-fib with variable rate during evaluation P:  If patient's hemodynamic status changes he may need a rate control agent for stabilization of heart rate Continue to monitor telemetry  Repeat stat ECG if status or rhythm change noted  RENAL A:   ?AKI-serum Cr increased to 1.51 from baseline of ~1.08 Electrolytes WNL's P:   BMP in am for Cr and electrolytes Continue normal saline at 128ml/hr  GASTROINTESTINAL A:   Patient has a history of cirrhosis and ascites requiring weekly paracentesis  Transaminases AST/ALT-49/23, total bili 5.3 Leukocytosis 13.1, given history of ascites, concern for SBP high Ammonia 28 this admission P:   Broad spectrum antibiotics as section above Consider paracentesis with culture when patient able to tolerate Repeat LFT's in am Continue lactulose when patient able to tolerate PO intake or tube  placed  HEMATOLOGIC A:   Hgb 11.2, stable but slightly lower than 3wks prior P: Repeat CBC to monitor WBC curve and Hgb  INFECTIOUS A:   Given ascites SBP primary concern for infection source No other clear etiology at this time P:   Continue antibiotics and follow culture as above Consider paracentesis when able Monitor fever curve Q-shift Monitor WBC progression w/ daily CBC  ENDOCRINE A:   TSH pending No documented PMHx of DMII   P:   Monitor CBG Q4 hours  NEUROLOGIC A:   Patient altered, unable to provide history Confused beyond baseline as per record review P:   UA, EtOH, procalcitonin, Folate, TSH, RPR, Vit-B12, HIV need collected  CT head pending control of involuntary movement  CT cervical spine-no acute process in the C-spine Ativan 1-2mg  for agitation/anxiety  RASS goal: n/a   FAMILY  - Updates:   - Inter-disciplinary family meet or Palliative Care meeting due by:  day 7   Lanelle Bal, MD Resident on Call with  Pulmonary  and Critical Care Medicine [facility] Pager: [phone]  02/23/2017, 5:43 AM

## 2017-02-23 NOTE — Progress Notes (Addendum)
Palliative Medicine RN Note: Patient with slower resp rate, but breathing is still very labored, and family does not feel he is comfortable. Breathing also in line with hx of OSA. Discussed w PMT PA Norvel RichardsMarianne Dellinger, who adjusted medications, including giving clear parameters for dose increases and when to call MD. RN Rosalita ChessmanSuzanne will turn patient to his side to see if we can help with likely obstruction.  Margret ChanceMelanie G. Shaft Corigliano, RN, BSN, [facility] Palliative Medicine Team 02/23/2017 4:26 PM Office [phone]

## 2017-02-23 NOTE — Progress Notes (Signed)
Lawrence BalesDavid H Dekay EAV:[id number] DOB: 08/19/1945 DOA: 02/20/2017 PCP: Kandyce West, [name], MD   Subj: 72 year old BM PMHx  Arthritis, Ascites, Avascular necrosis of bone of left hip (HCC) (07/08/2015), Brown recluse spider bite (1990s), Cirrhosis of liver (HCC), Depression, Edema of both legs, Gout (X 1), High cholesterol, Hypertension, Narcolepsy, Rheumatic fever, and Sleep apnea [facility] with h/o multiple falls and c/o back pain. Pt altered in ED documented as agitated received many doses of sedative medications including haldol and  ativan. PCCM consulted for possible Precedex ggt.    Plan: Admit to ICU for close management as pt is at risk for decompensation. workup etiology of altered mental status: UDS, Ammonia level was 28, TSH, ETOH level.  Pt may also need an LP esp given h/o back pain pt does have a Slight leukocytosis and left shift noted on diff.   Contacted by Dr. Koren BoundWesam Yacoub this A.m. [facility]. Productive tube patient was found to be nonresponsive and when family was contacted family decided to make patient comfort care. Critical care admitted patient and wrote all admission orders, however I agreed to take over care patient since he was being transitioned to comfort care.   Obj: Objective: VITAL SIGNS: BP: 114/69 (01/16 0900) Pulse Rate: 101 (01/16 0900) SPO2; FIO2:   Intake/Output Summary (Last 24 hours) at 02/23/2017 2054 Last data filed at 02/23/2017 0422 Gross per 24 hour  Intake 1750 ml  Output 5 ml  Net 1745 ml     Exam: General: A/O 0. Unresponsive  Lungs: Clear to auscultation bilaterally without wheezes or crackles Cardiovascular: Regular rate and rhythm without murmur gallop or rub normal S1 and S2 Abdomen: Nontender, nondistended, soft, bowel sounds positive, no rebound, no ascites, no appreciable mass Extremities: No significant cyanosis, clubbing, or edema bilateral lower extremities   Procedure/Significant Events:   I  have personally reviewed and interpreted all radiology studies and my findings are as above.     A/P Multisystem organ failure: To include A. fib, acute on chronic renal failure, liver cirrhosis requiring weekly paracentesis, SBP? -Comfort care            Care during the described time interval was provided by me .  I have reviewed this patient's available data, including medical history, events of note, physical examination, and all test results as part of my evaluation.

## 2017-02-23 NOTE — Progress Notes (Signed)
Palliative Medicine RN Note: Symptom check following initiation of hydromorphone and lorazepam drips. Pt has just arrived from ER. Neither Dilaudid nor Ativan are running, and his IV is leaking. RN will replace IV stat, but patient does not have his ordered infusions ready to hang yet. He is using accessory muscles and gasping. [place] remains in place.   I spoke with NP Nikki-Packenpick Cousar and PA Norvel RichardsMarianne Dellinger with PMT. They ordered subcutaneous/IM hydromorphone and lorazepam to cover the gap until the meds arrive. I spoke with the pharmacist to ensure clarity of orders.  Plan to find the family to provide update now. Will follow up this afternoon.  Margret ChanceMelanie G. Edge Mauger, RN, BSN, [facility] Palliative Medicine Team 02/23/2017 1:02 PM Office [phone]

## 2017-02-23 NOTE — ED Notes (Signed)
Pt has been very agitated the entire stay with slight increase; pt attempts to get out of bed by throwing his legs over the bed railings; Seizure pads placed on rails to attempt to keep patient from flipping over bed rail; Md and Charge RN notified and soft restraints used for patients legs only for safety purposes-Monique,RN

## 2017-02-23 NOTE — Progress Notes (Signed)
   02/23/17 1000  Clinical Encounter Type  Visited With Patient (long time friend)  Visit Type Patient actively dying  Referral From Physician  Consult/Referral To Chaplain  Spiritual Encounters  Spiritual Needs Prayer   Responded to a SCC for EOL.  Patient had a long time friend by his side.  The friend shared some about his life and said the patient's sister was on the way.  Called the patients name when asking about prayer and we all prayed together.  Will follow as needed, will check back when sister arrives. Chaplain Agustin CreeNewton Voncille Simm

## 2017-02-23 NOTE — Progress Notes (Signed)
Wasted 172 ml of remaining bag of discontinued morphine drip in sink with Luna KitchensSuzanne Seth, RN.

## 2017-02-23 NOTE — Consult Note (Signed)
Consultation Note Date: 02/23/2017   Patient Name: Lawrence West  DOB: 1945/12/04  MRN: [id number]  Age / Sex: 72 y.o., male  PCP: Derinda Late, MD Referring Physician: Allie Bossier, MD  Reason for Consultation: Non pain symptom management, Psychosocial/spiritual support and Terminal Care  HPI/Patient Profile: 72 y.o. male  with past medical history of cirrhosis, OSA on cpap, AVN of the left hip who was on hospice at home recently was admitted on 02/13/2017 with altered mental status and severe agitation.  The patient was seen by critical care who worked with the family and established DNR/comfort care.   When PMT arrived goals were established and we attempted to assist with symptom managment  Clinical Assessment and Goals of Care:  I have reviewed medical records including EPIC notes, labs and imaging, received report from [facility] MD, assessed the patient and then met at the bedside along with his son Evangeline Gula and his sister Baldo Ash  to discuss diagnosis prognosis,  EOL wishes  On first entering the room NP [employer] and I were struck by the patient's appearance.  He was in trendelenburg with moderate to severe increased work of breathing.  He was on a morphine gtt at 10 mg.  We set to work immediately trying to relieve his labored breathing.  Multiple boluses of medication were given with some improvement in symptoms.  Over 10 minutes the patient's symptoms would worsen again.  We rotated opioid medications and requested a benzodiazepine gtt.  We discussed a brief life review of the patient.  He was an [employer] who took a great deal of pride in his appearance.  He loved people and loved to have fun.  Per his family the patient knew his time was very short.  He has been undergoing paracentesis every Friday and been on [facility].  He recently was hospitalized at [facility] and had  been discharged to SNF about 1 week ago.  I attempted to elicit values and goals of care important to the patient.  His greatest concern and request was to die with dignity.  He wanted to be made comfortable.  We explained to the family that Mr. Kennon Rounds time is very short - hours, and that his symptoms were extraordinarily difficult to control.  We re-assured the family that we would do our best to make him comfortable.  Primary Decision Maker:  NEXT OF KIN  SON Darren and Sister Lindsay    Full comfort.  Symptoms of respiratory distress are extraordinarily difficult to control. Rotate from Morphine to dilaudid gtt with PRN boluses Add ativan gtt for comfort / sedation. If patient can be made comfortable - wean oxygen.  Code Status/Advance Care Planning:  DNR   Symptom Management:   See above.  Psycho-social/Spiritual:   Desire for further Chaplaincy support: yes - appreciate chaplain services  Prognosis:  Minutes to hours.    Discharge Planning: Anticipated Hospital Death      Primary Diagnoses: Present on Admission: . Altered  mental state . Encephalitis   I have reviewed the medical record, interviewed the patient and family, and examined the patient. The following aspects are pertinent.  Past Medical History:  Diagnosis Date  . Arthritis    "left knee" (07/08/2015)  . Ascites   . Avascular necrosis of bone of left hip ([place]) 07/08/2015  . Brown recluse spider bite 1990s   "they thought I was gonna die then"  . Cirrhosis of liver (HCC)    Sees Dr. Claudie Leach  . Depression   . Edema of both legs    Takes Lasix  . Gout X 1  . High cholesterol   . Hypertension   . Narcolepsy   . Rheumatic fever   . Sleep apnea    pt stated "I have sleep apnea but I do not use the machine...Marland KitchenMarland KitchenMarland Kitchenit broke" (07/08/2015)   Social History   Socioeconomic History  . Marital status: Single    Spouse name: Not on file  . Number of  children: Not on file  . Years of education: Not on file  . Highest education level: Not on file  Social Needs  . Financial resource strain: Not on file  . Food insecurity - worry: Not on file  . Food insecurity - inability: Not on file  . Transportation needs - medical: Not on file  . Transportation needs - non-medical: Not on file  Occupational History  . Not on file  Tobacco Use  . Smoking status: Never Smoker  . Smokeless tobacco: Never Used  Substance and Sexual Activity  . Alcohol use: Yes    Alcohol/week: 0.0 oz    Comment: 07/08/2015 former Heavy ETOH abuse.; "haven't had a drink in 2017; AA told me I didn't need to come back"  . Drug use: No    Comment: 07/08/2015 "just in my early 20s"  . Sexual activity: No  Other Topics Concern  . Not on file  Social History Narrative  . Not on file   Family History  Problem Relation Age of Onset  . Melanoma Father   . Lung cancer Father    Scheduled Meds: . LORazepam  2 mg Intramuscular Q4H   Continuous Infusions: . sodium chloride    . HYDROmorphone    . LORazepam (ATIVAN) infusion     PRN Meds:.sodium chloride, antiseptic oral rinse, [DISCONTINUED] glycopyrrolate **OR** glycopyrrolate **OR** glycopyrrolate, [DISCONTINUED] haloperidol **OR** haloperidol **OR** haloperidol lactate, HYDROmorphone, HYDROmorphone (DILAUDID) injection, LORazepam, ondansetron **OR** ondansetron (ZOFRAN) IV, polyvinyl alcohol No Known Allergies Review of Systems patient unable  Physical Exam  Well developed male - non responsive Moderate to severe increased work of breathing with retractions CV tachy Abdomen soft.  Vital Signs: BP 114/69   Pulse (!) 101   Resp (!) 29   SpO2 91%      Pain Score: 8    SpO2: SpO2: 91 % O2 Device:SpO2: 91 % O2 Flow Rate: .O2 Flow Rate (L/min): 4 L/min  IO: Intake/output summary:   Intake/Output Summary (Last 24 hours) at 02/23/2017 1320 Last data filed at 02/23/2017 0422 Gross per 24 hour  Intake 1750  ml  Output 5 ml  Net 1745 ml    LBM:   Baseline Weight:   Most recent weight:       Palliative Assessment/Data: 10%     Time In: 11:00 Time Out: 12:30 Time Total: 90 min. Greater than 50%  of this time was spent counseling and coordinating care related to the above assessment and plan.  Signed by:  Florentina Jenny, PA-C Palliative Medicine Pager: [phone]  Please contact Palliative Medicine Team phone at [phone] for questions and concerns.  For individual provider: See Shea Evans

## 2017-02-23 NOTE — Progress Notes (Addendum)
Palliative Medicine RN Note: Spent 45 minutes with patient's sister, brother in law, and friends. Life review, patient's previous preferences for visitors discussed. Patient has been on hsopice for months with [facility]. I called them; they were unaware of hospital visit. He had [facility]  [facility] would [facility] to [facility] [place] [phone].   Margret ChanceMelanie G. Rogenia Werntz, RN, BSN, [facility] Palliative Medicine Team 02/23/2017 1:50 PM Office [phone]

## 2017-02-23 NOTE — ED Notes (Signed)
RN assisted Phelmbotomy to obtain blood-Monique,RN

## 2017-02-23 NOTE — Patient Outreach (Addendum)
[facility]) Care Management  [facility] Social Work  02/23/2017  Lawrence West Jun 29, 1945 [id number]  Subjective:  Patient is a 72 year old male currently in rehab following a fall. This social worker was unable to complete assessment due to patient's condition. Patient has poor cognition and appears very agitated. He was not able to respond to questions. Family friend Lawrence West, at bedside who states that he is patient's POA. Per Lawrence West patient was receiving Hospice services, however came out of Hospice services to receive rehab after his fall.  Per Lawrence West due to patient's rapid decline, patient will likely not return home and will require long term care.  Objective:   Encounter Medications:  Facility-Administered Encounter Medications as of 02/28/2017  Medication  . 0.9 %  sodium chloride infusion  . antiseptic oral rinse (BIOTENE) solution 15 mL  . [COMPLETED] ceFEPIme (MAXIPIME) 1 g in dextrose 5 % 50 mL IVPB  . glycopyrrolate (ROBINUL) injection 0.2 mg   Or  . glycopyrrolate (ROBINUL) injection 0.2 mg  . haloperidol (HALDOL) 2 MG/ML solution 0.5 mg   Or  . haloperidol lactate (HALDOL) injection 0.5 mg  . [COMPLETED] haloperidol lactate (HALDOL) injection 2 mg  . [COMPLETED] haloperidol lactate (HALDOL) injection 5 mg  . HYDROmorphone (DILAUDID) 100 mg in sodium chloride 0.9 % 50 mL (2 mg/mL) infusion  . HYDROmorphone (DILAUDID) bolus via infusion 2-4 mg  . [EXPIRED] HYDROmorphone (DILAUDID) injection 1 mg  . [EXPIRED] iopamidol (ISOVUE-370) 76 % injection  . LORazepam (ATIVAN) 50 mg in dextrose 5 % 50 mL (1 mg/mL) infusion  . LORazepam (ATIVAN) bolus via infusion 2 mg  . [COMPLETED] LORazepam (ATIVAN) injection 1 mg  . [COMPLETED] LORazepam (ATIVAN) injection 2 mg  . LORazepam (ATIVAN) injection 2 mg  . ondansetron (ZOFRAN-ODT) disintegrating tablet 4 mg   Or  . ondansetron (ZOFRAN) injection 4 mg  . polyvinyl alcohol (LIQUIFILM TEARS) 1.4 % ophthalmic  solution 1 drop  . [COMPLETED] sodium chloride 0.9 % bolus 1,000 mL  . [COMPLETED] sodium chloride 0.9 % bolus 500 mL  . [COMPLETED] vancomycin (VANCOCIN) IVPB 1000 mg/200 mL premix  . [DISCONTINUED] 0.9 %  sodium chloride infusion  . [DISCONTINUED] acetaminophen (TYLENOL) suppository 650 mg  . [DISCONTINUED] acetaminophen (TYLENOL) tablet 650 mg  . [DISCONTINUED] ceFEPIme (MAXIPIME) 1 g in dextrose 5 % 50 mL IVPB  . [DISCONTINUED] glycopyrrolate (ROBINUL) tablet 1 mg  . [DISCONTINUED] haloperidol (HALDOL) tablet 0.5 mg  . [DISCONTINUED] heparin injection 5,000 Units  . [DISCONTINUED] LORazepam (ATIVAN) 2 MG/ML concentrated solution 1 mg  . [DISCONTINUED] LORazepam (ATIVAN) 50 mg in dextrose 5 % 50 mL (1 mg/mL) infusion  . [DISCONTINUED] LORazepam (ATIVAN) injection 1 mg  . [DISCONTINUED] LORazepam (ATIVAN) injection 1 mg  . [DISCONTINUED] LORazepam (ATIVAN) injection 1-2 mg  . [DISCONTINUED] LORazepam (ATIVAN) tablet 1 mg  . [DISCONTINUED] morphine 250 mg in sodium chloride 0.9 % 250 mL (1 mg/mL) infusion  . [DISCONTINUED] morphine bolus via infusion 2-10 mg  . [DISCONTINUED] morphine bolus via infusion 2-10 mg  . [DISCONTINUED] morphine bolus via infusion 5-20 mg  . [DISCONTINUED] OLANZapine zydis (ZYPREXA) disintegrating tablet 5 mg  . [DISCONTINUED] vancomycin (VANCOCIN) 500 mg in sodium chloride irrigation 0.9 % 100 mL ENEMA  . [DISCONTINUED] vancomycin (VANCOCIN) IVPB 750 mg/150 ml premix   Outpatient Encounter Medications as of 02/21/2017  Medication Sig Note  . Amino Acids-Protein Hydrolys (FEEDING SUPPLEMENT, PRO-STAT SUGAR FREE 64,) LIQD Take 30 mLs by mouth 2 (two) times daily.   Marland Kitchen  atorvastatin (LIPITOR) 10 MG tablet Take 1 tablet (10 mg total) by mouth daily.   . bisacodyl (DULCOLAX) 10 MG suppository Place 10 mg rectally as needed for moderate constipation.   . Camphor-Menthol-Methyl Sal (HM SALONPAS PAIN RELIEF) 1.2-5.7-6.3 % PTCH Apply topically daily. To left chest at  site of greatest pain   . furosemide (LASIX) 40 MG tablet Take 40 mg by mouth daily.   . Lactulose 20 GM/30ML SOLN Take 25 mLs by mouth every 4 (four) hours.  02/12/2017: Has not started   . LORazepam (ATIVAN) 0.5 MG tablet Take 0.5 mg by mouth every 6 (six) hours as needed for anxiety.   . Magnesium 400 MG TABS Take 1 tablet by mouth 2 (two) times daily.   . magnesium hydroxide (MILK OF MAGNESIA) 400 MG/5ML suspension Take 30 mLs by mouth daily as needed for mild constipation.   . ondansetron (ZOFRAN) 4 MG tablet Take 4 mg by mouth every 6 (six) hours as needed for nausea or vomiting.   Marland Kitchen. oxyCODONE (ROXICODONE) 15 MG immediate release tablet Take 7.5 mg by mouth every 4 (four) hours as needed for pain.    . OXYGEN Inhale 2 L/min into the lungs.   . pantoprazole (PROTONIX) 40 MG tablet Take 1 tablet (40 mg total) by mouth 2 (two) times daily.   . rifaximin (XIFAXAN) 550 MG TABS tablet Take 1 tablet (550 mg total) by mouth 2 (two) times daily.   . Sodium Phosphates (RA SALINE ENEMA) 19-7 GM/118ML ENEM Place 1 each rectally as needed (or constipation).   Marland Kitchen. spironolactone (ALDACTONE) 25 MG tablet Take 1 tablet (25 mg total) by mouth daily.   Melene Muller. [START ON 03/04/2017] Vancomycin HCl (FIRVANQ) 50 MG/ML SOLR Take 125 mg by mouth every other day.   . Vancomycin HCl (FIRVANQ) 50 MG/ML SOLR Take 125 mg by mouth every 12 (twelve) hours. 02/17/2017: For 7 days (started 02/19/17)    Functional Status:  In your present state of health, do you have any difficulty performing the following activities: 11/05/2016 06/24/2016  Hearing? N N  Vision? N N  Difficulty concentrating or making decisions? N Y  Walking or climbing stairs? N Y  Dressing or bathing? N N  Doing errands, shopping? N N  Some recent data might be hidden    Fall/Depression Screening:  No flowsheet data found.  Assessment: Assessment not completed due to patient's rapidly declining condition. Spoke with Enrique SackKendra, discharge planner who confirmed  that [facility] or transfer to Hospice home  Plan: This social worker to close patient at this time due to his critical  medical condition.

## 2017-02-23 NOTE — ED Notes (Signed)
[facility]

## 2017-02-24 LAB — RPR: RPR: NONREACTIVE

## 2017-02-25 ENCOUNTER — Other Ambulatory Visit: Payer: Self-pay | Admitting: *Deleted

## 2017-02-25 ENCOUNTER — Encounter: Payer: Self-pay | Admitting: *Deleted

## 2017-02-25 ENCOUNTER — Ambulatory Visit: Admission: RE | Admit: 2017-02-25 | Source: Ambulatory Visit

## 2017-02-25 NOTE — Patient Outreach (Signed)
[facility] Care Management  02/25/2017  Lawrence West 11/30/1945 [id number]   Epic reviewed, patient admitted to the hospital and was immediately transitioned to comfort care. Patient expired at [phone]. [facility] at this time.  Patient's providers office to be notified.   Lawrence ReamsChrystal Kaesen Rodriguez, [name] [facility] Care [facility] [phone]

## 2017-02-28 LAB — CULTURE, BLOOD (ROUTINE X 2)
Culture: NO GROWTH
Culture: NO GROWTH
Special Requests: ADEQUATE
Special Requests: ADEQUATE

## 2017-03-04 ENCOUNTER — Ambulatory Visit

## 2017-03-06 IMAGING — US US PARACENTESIS
1 series · 6 of 6 positions shown · non-contrast
Comparison: none

INDICATION: 70-year-old male with recurrent ascites.

[Series 1: us paracentesis · 0.30mm/px · 6 of 6 slices shown]
[im 1/6]
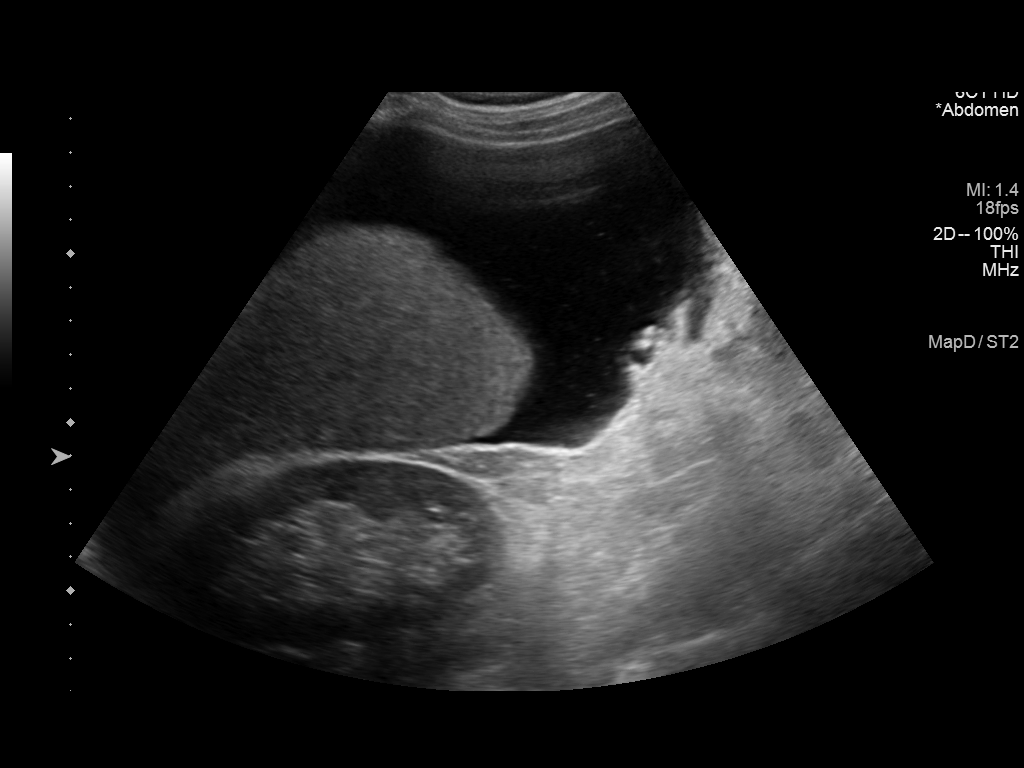
[im 2/6]
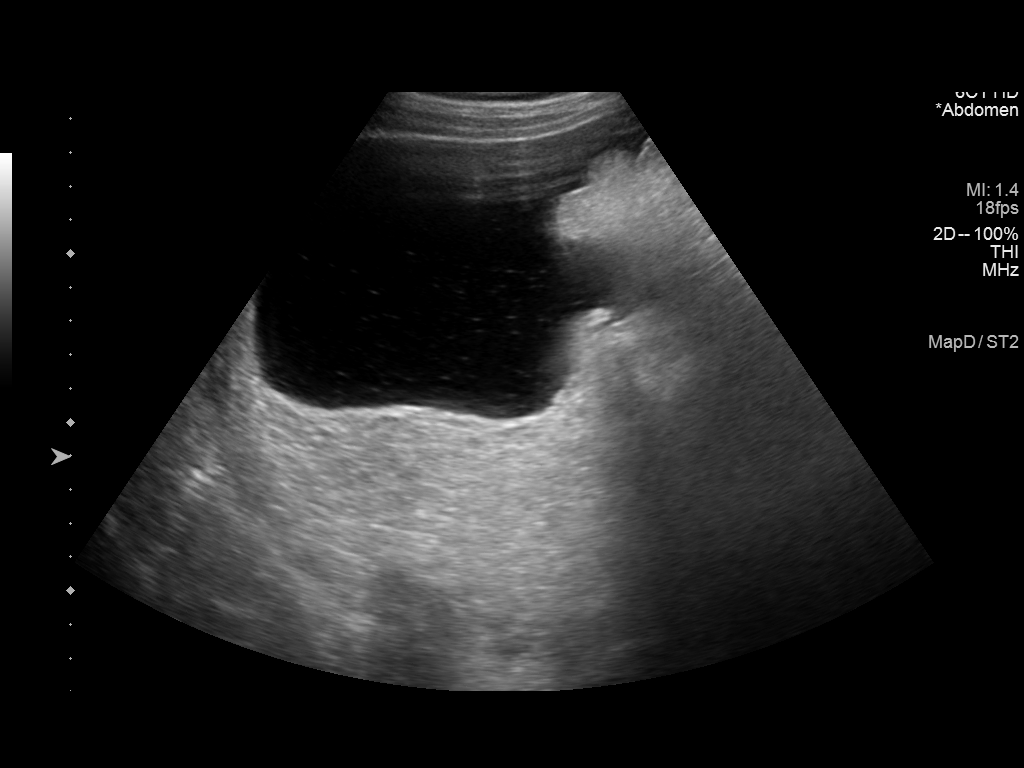
[im 3/6]
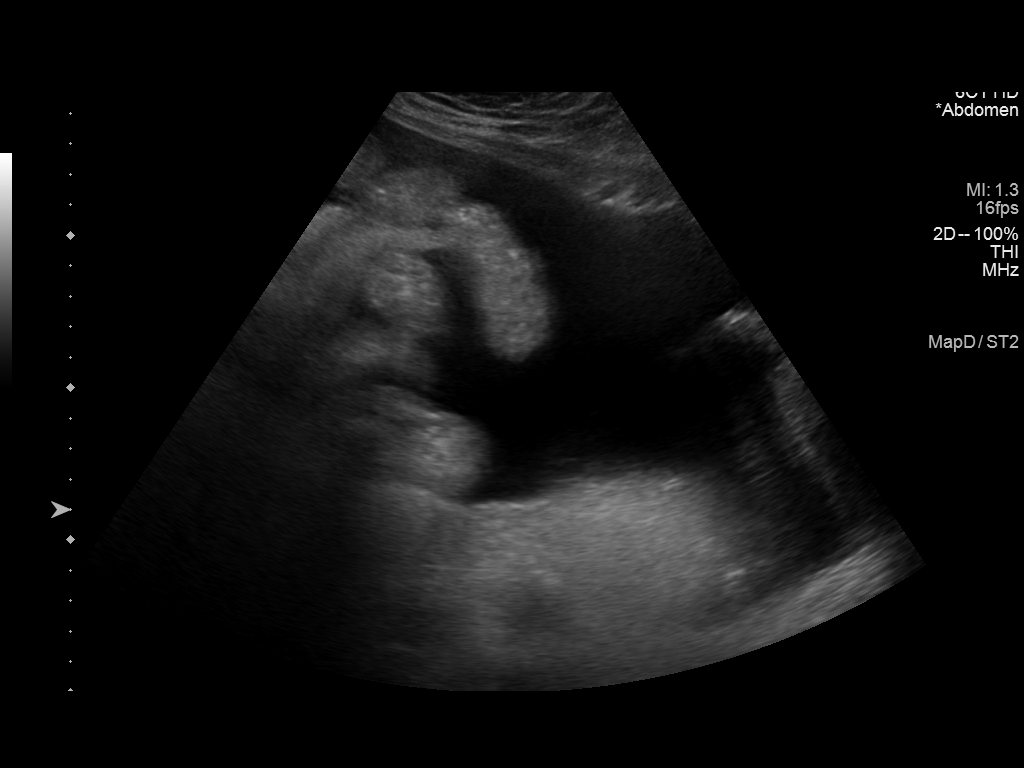
[im 4/6]
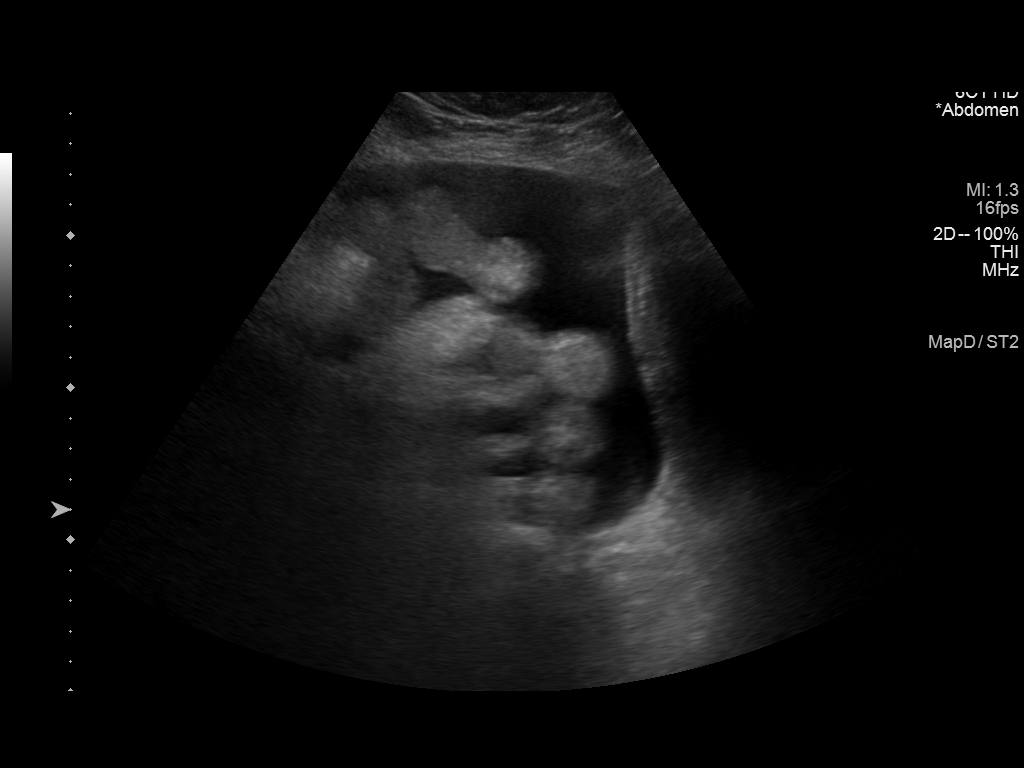
[im 5/6]
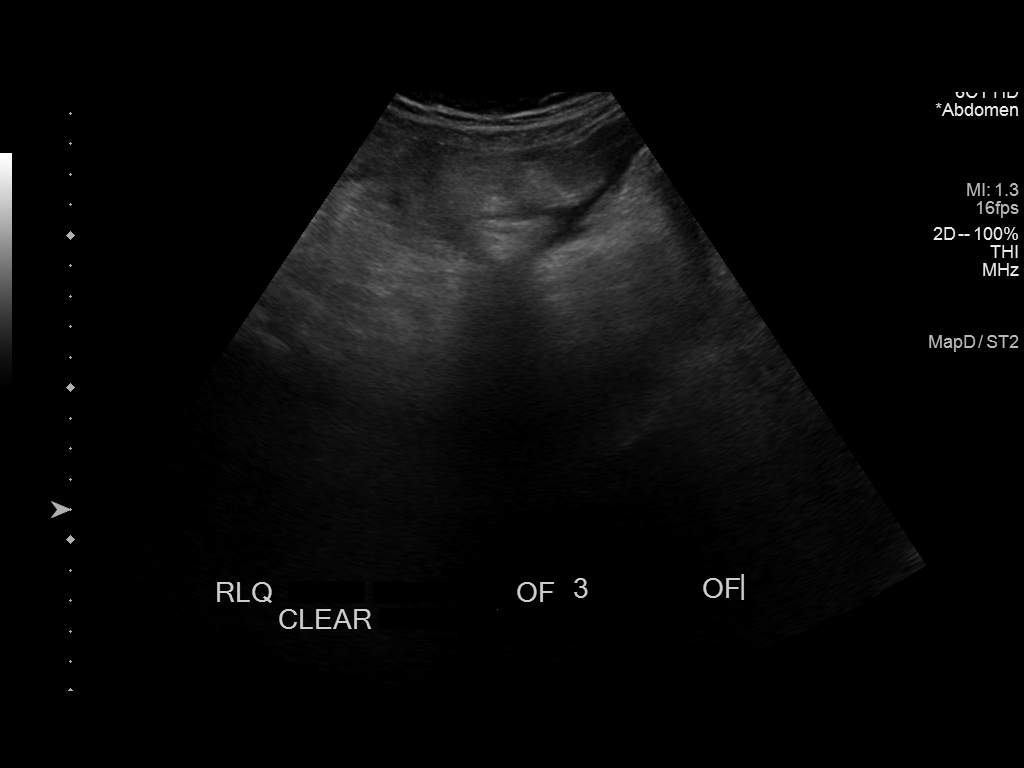
[im 6/6]
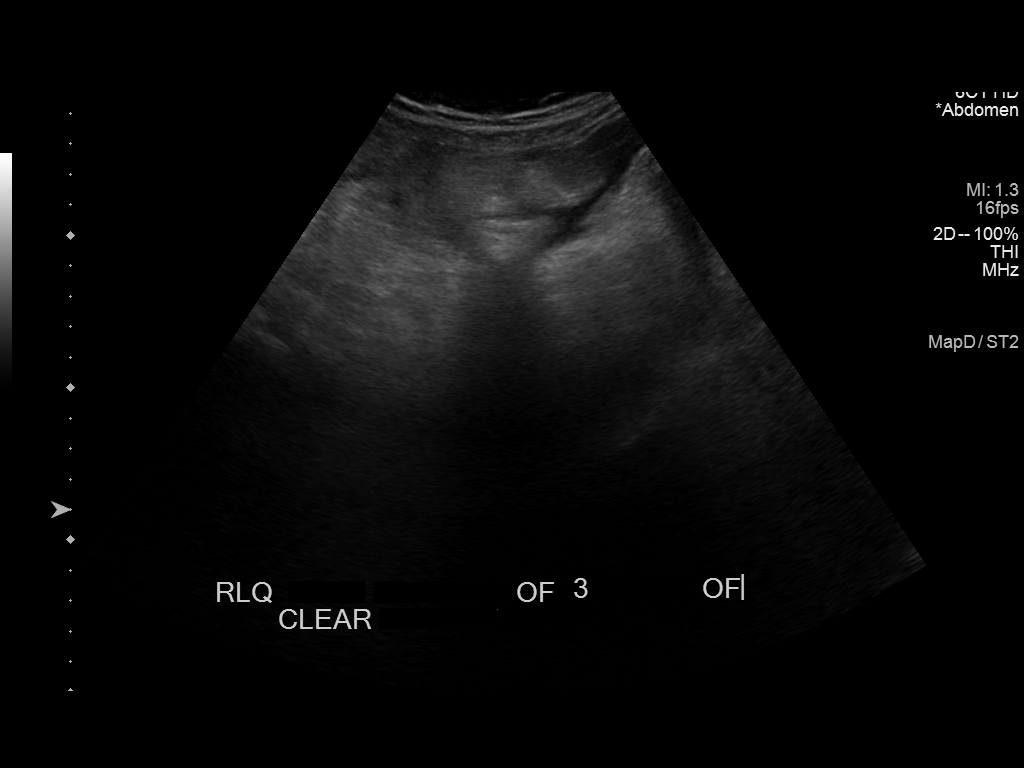

[6 of 6 positions shown; findings below may reference images not displayed]

EXAM:
ULTRASOUND GUIDED  PARACENTESIS

MEDICATIONS:
None.

COMPLICATIONS:
None immediate.

PROCEDURE:
Informed written consent was obtained from the patient after a
discussion of the risks, benefits and alternatives to treatment. A
timeout was performed prior to the initiation of the procedure.

Initial ultrasound scanning demonstrates a large amount of ascites
within the right lower abdominal quadrant. The right lower abdomen
was prepped and draped in the usual sterile fashion. 1% lidocaine
with epinephrine was used for local anesthesia.

Following this, a 6 Fr Safe-T-Centesis catheter was introduced. An
ultrasound image was saved for documentation purposes. The
paracentesis was performed. The catheter was removed and a dressing
was applied. The patient tolerated the procedure well without
immediate post procedural complication.
FINDINGS: A total of approximately 3 L of clear straw-colored fluid was
removed. Samples were sent to the laboratory as requested by the
clinical team.
IMPRESSION: Successful ultrasound-guided paracentesis yielding 3 liters of
peritoneal fluid.

## 2017-03-11 NOTE — Discharge Summary (Signed)
Death Summary  Lawrence BalesDavid H West UJW:[id number] DOB: 01/25/1946 DOA: 02/21/2017  PCP: Lawrence West PCP/Office notified:   Admit date: 02/09/2017 Date of Death: Sep 13, 2017  Final Diagnoses:  Active Problems:   Altered mental state   Encephalitis   Alcoholic cirrhosis of liver with ascites (HCC)   Palliative care encounter  EtOH liver cirrhosis -Patient admitted and immediately transition to comfort care   SBP -Suspected however never verified secondary to patient being an immediately transition to comfort care by family  EtOH abuse -Patient comfort care.     History of present illness:  72 year old BM PMHx  Arthritis, Ascites, Avascular necrosis of bone of left hip (HCC) (07/08/2015), Brown recluse spider bite (1990s), Cirrhosis of liver (HCC), Depression, Edema of both legs, Gout (X 1), High cholesterol, Hypertension, Narcolepsy, Rheumatic fever, and Sleep apnea [facility]/o multiple falls and c/o back pain. Pt altered in ED documented as agitated received many doses of sedative medications including haldol and  ativan. PCCM consulted for possible Precedex ggt.    Plan: Admit to ICU for close management as pt is at risk for decompensation. workup etiology of altered mental status: UDS, Ammonia level was 28, TSH, ETOH level.  Pt may also need an LP esp given h/o back pain pt does have a Slight leukocytosis and left shift noted on diff.    Contacted by Dr. Koren BoundWesam West this A.m. [facility]. Productive tube patient was found to be nonresponsive and when family was contacted family decided to make patient comfort care. Critical care admitted patient and wrote all admission orders, however I agreed to take over care patient since he was being transitioned to comfort care.   page by RN Lawrence West [phone] and informed that patient had expired at 0 714    Time: 0 714  Signed:  Dnyla Antonetti West  [facility] Sep 13, 2017, 8:09 AM

## 2017-03-11 NOTE — Progress Notes (Signed)
Wasted Ativan 8ml and Dilaudid 25ml to sink. Witnessed by Donavan FoilImelda Tuliao.

## 2017-03-11 NOTE — Progress Notes (Signed)
Upon nursing rounds, patient observed not responding,pulseless. Verified by Ardyth GalImelda Tuliao,RN. Pronounced dead @ 0714hr. Patient's sister, Gomez CleverlyCharlotte Pagette was notified. Dr.Wood was also made aware. [facility] was contacted. Patient ok for eye donor.Ref# [id number] by Cammy BrochureLatoshia Moore. Normal saline to both eyes and covered with 2x2 gauze. Post mortem care done.

## 2017-03-11 DEATH — deceased

## 2017-05-18 IMAGING — US US PARACENTESIS
1 series · 9 of 9 positions shown · non-contrast
Comparison: none

INDICATION: Ascites

[Series 1: us paracentesis · 0.30mm/px · 9 of 9 slices shown]
[im 1/9]
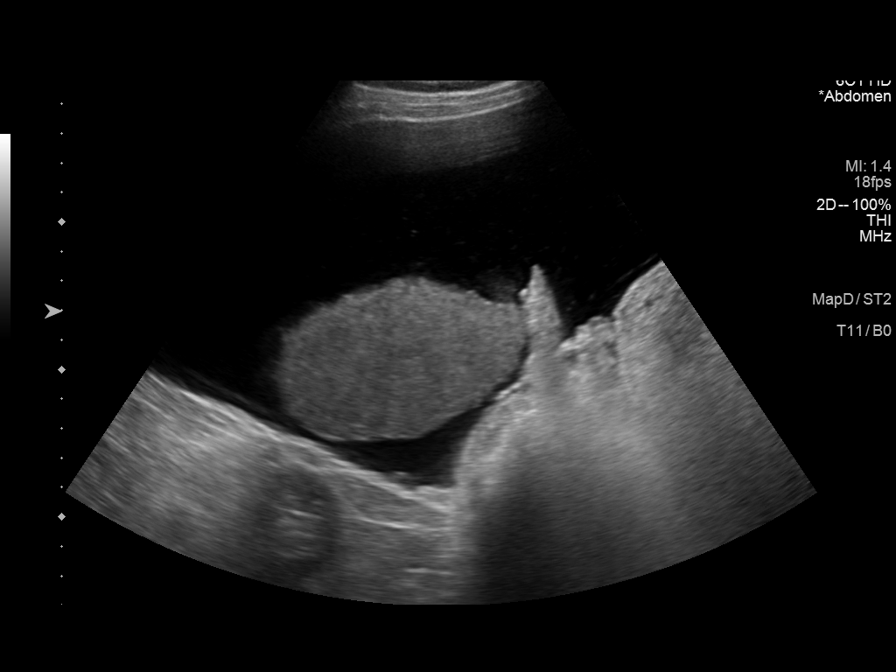
[im 2/9]
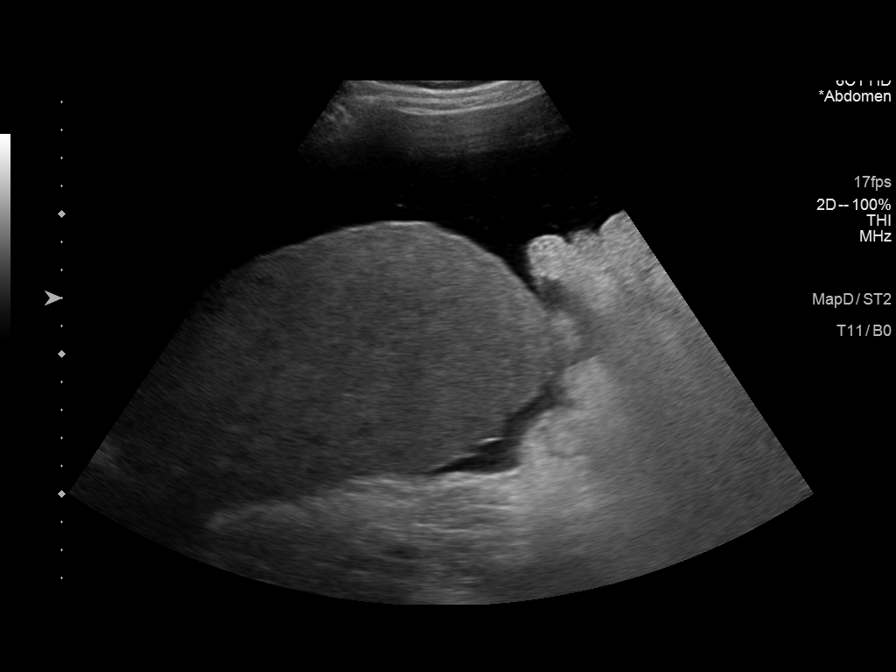
[im 3/9]
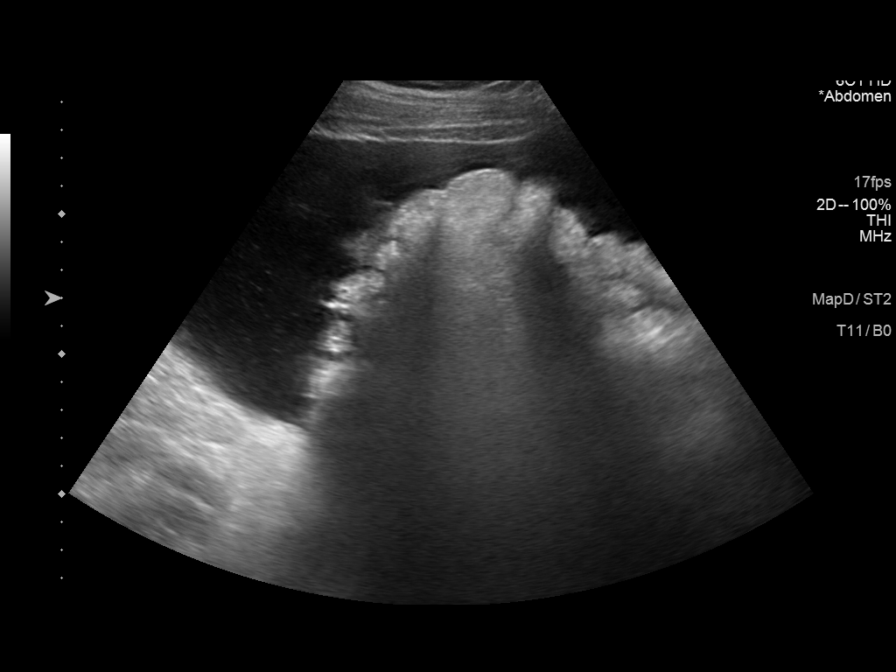
[im 4/9]
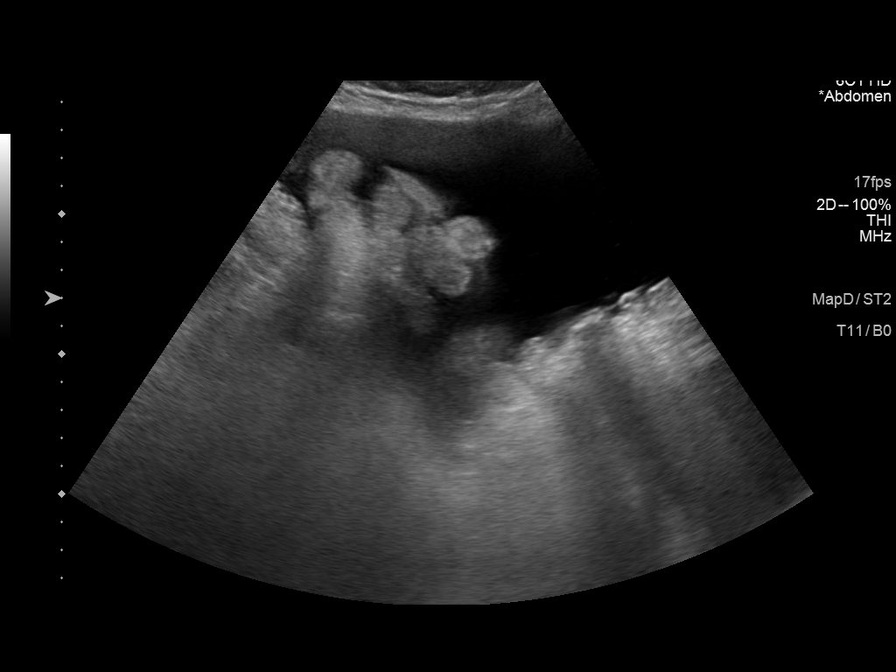
[im 5/9]
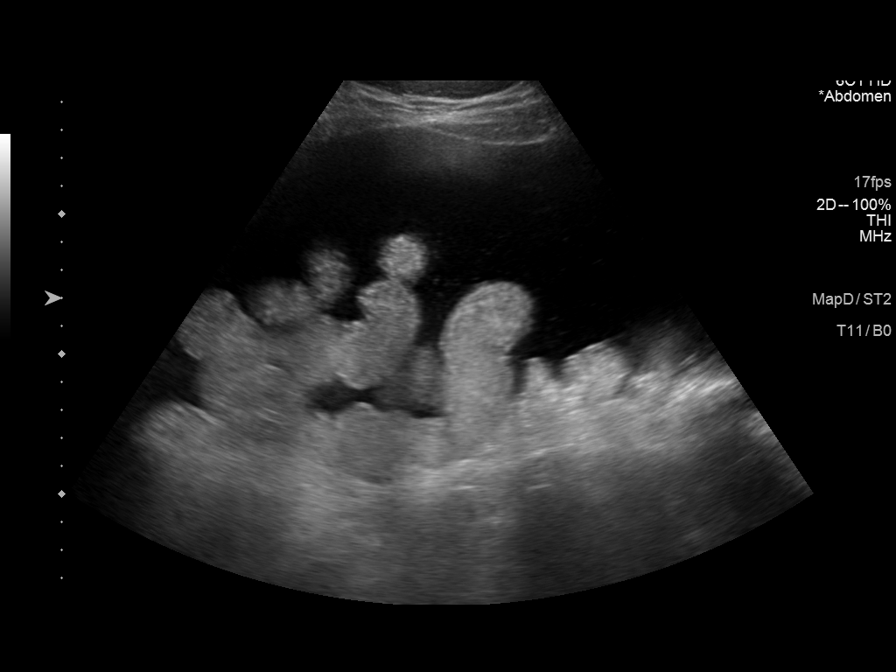
[im 6/9]
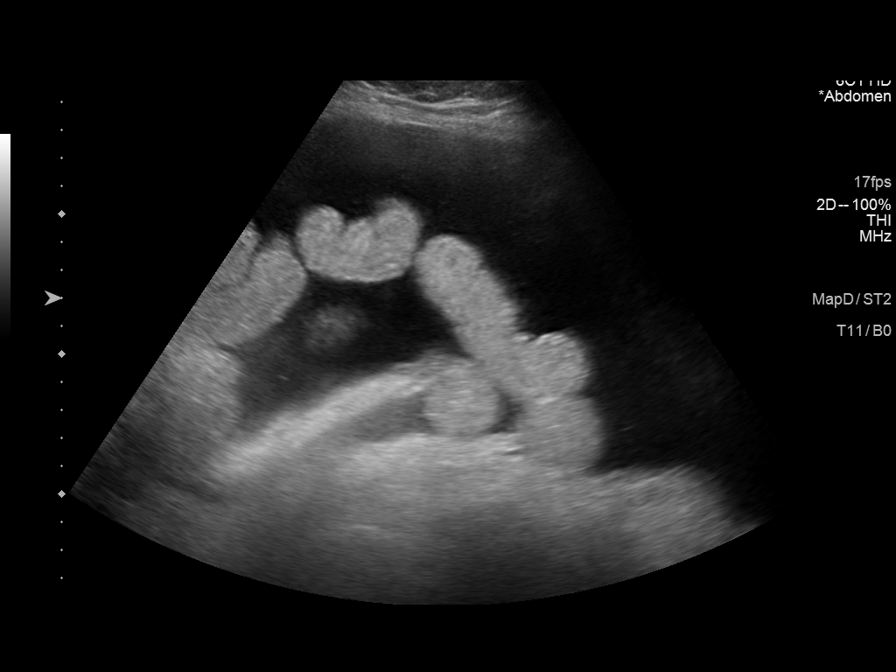
[im 7/9]
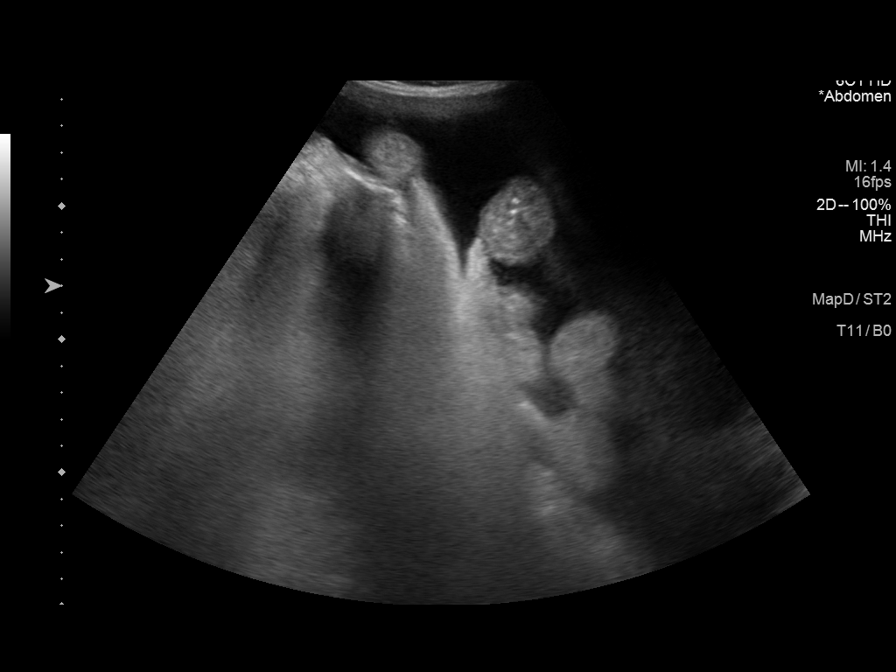
[im 8/9]
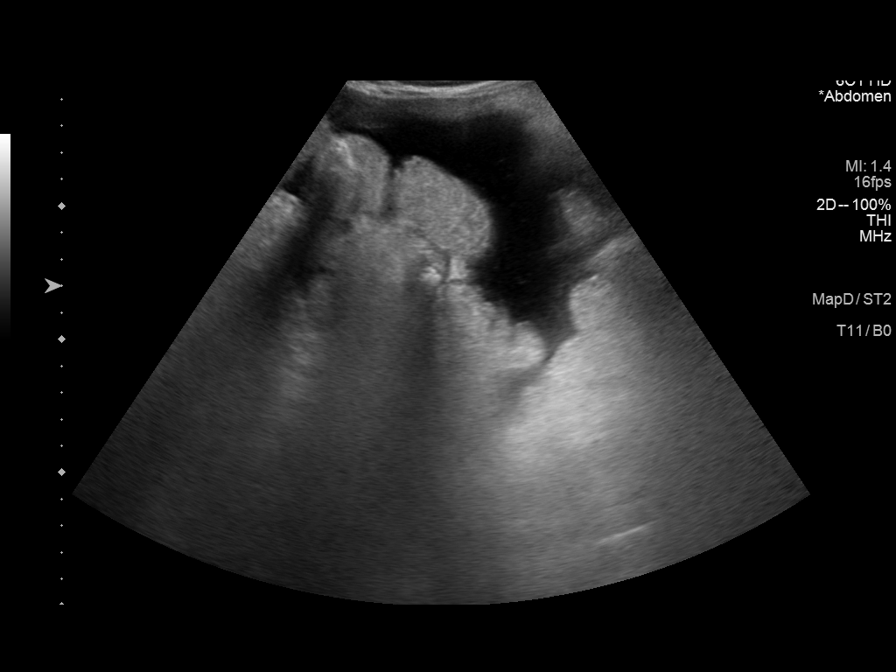
[im 9/9]
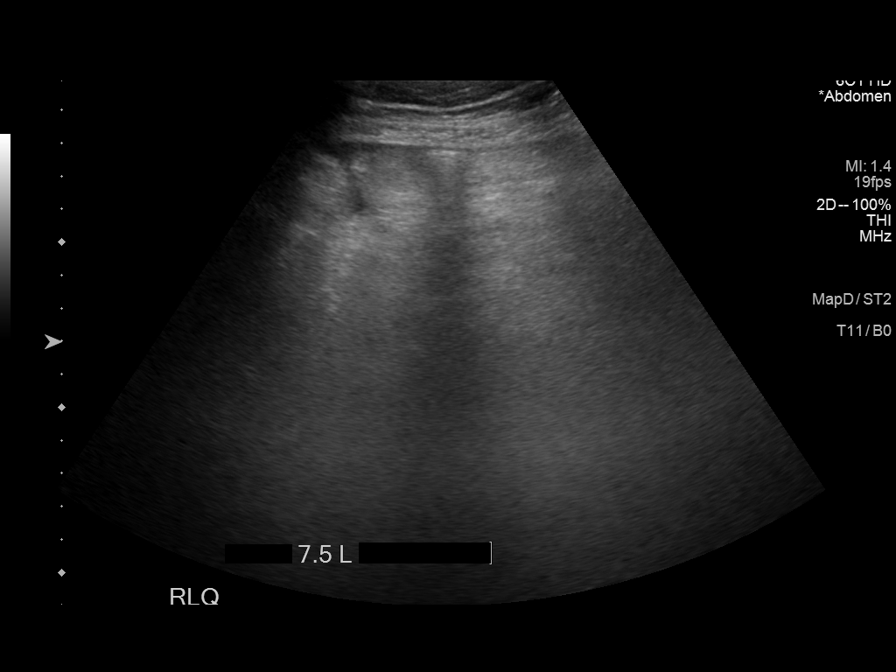

[9 of 9 positions shown; findings below may reference images not displayed]

EXAM:
ULTRASOUND GUIDED RIGHT LOWER QUADRANT PARACENTESIS

MEDICATIONS:
None.

COMPLICATIONS:
None immediate.

PROCEDURE:
Informed written consent was obtained from the patient after a
discussion of the risks, benefits and alternatives to treatment. A
timeout was performed prior to the initiation of the procedure.

Initial ultrasound scanning demonstrates a large amount of ascites
within the right lower abdominal quadrant. The right lower abdomen
was prepped and draped in the usual sterile fashion. 1% lidocaine
with epinephrine was used for local anesthesia.

Following this, a Safe-T-Centesis catheter was introduced. An
ultrasound image was saved for documentation purposes. The
paracentesis was performed. The catheter was removed and a dressing
was applied. The patient tolerated the procedure well without
immediate post procedural complication.
FINDINGS: A total of approximately 7.5 L of clear yellow fluid was removed.
IMPRESSION: Successful ultrasound-guided paracentesis yielding 7.5 liters of
peritoneal fluid.

## 2017-08-07 IMAGING — US US PARACENTESIS
1 series · 6 of 6 positions shown · non-contrast
Comparison: none

INDICATION: History of alcoholic cirrhosis. Recurrent large volume ascites.
Request therapeutic paracentesis.

[Series 1: us paracentesis · 0.28mm/px · 6 of 6 slices shown]
[im 1/6]
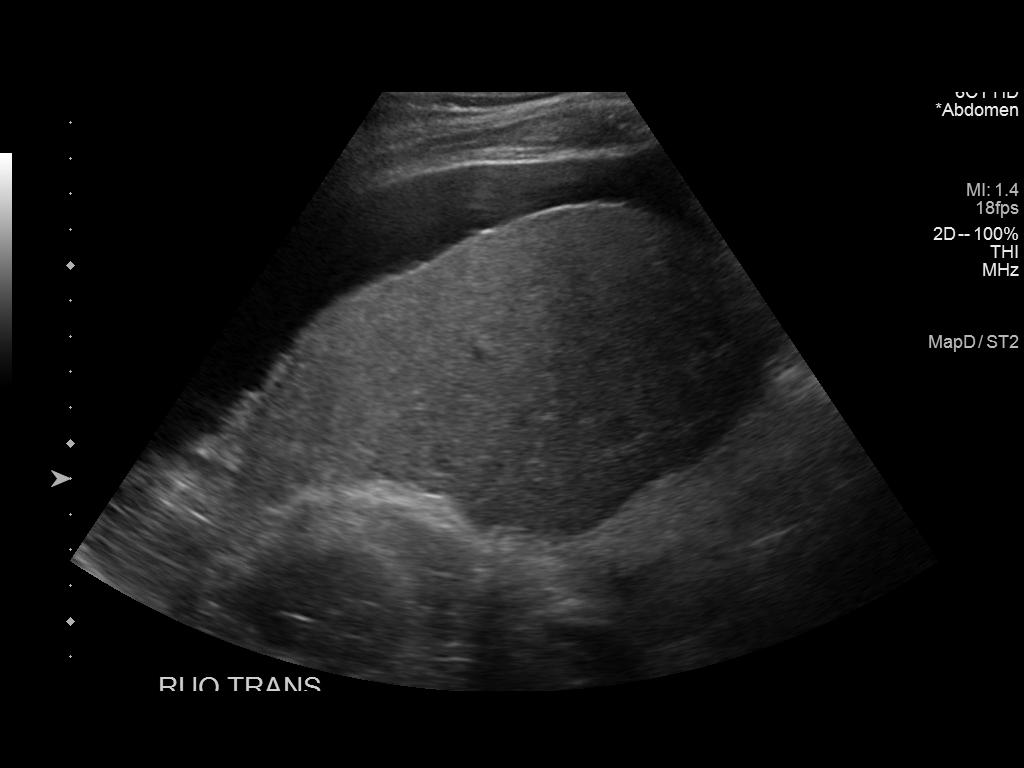
[im 2/6]
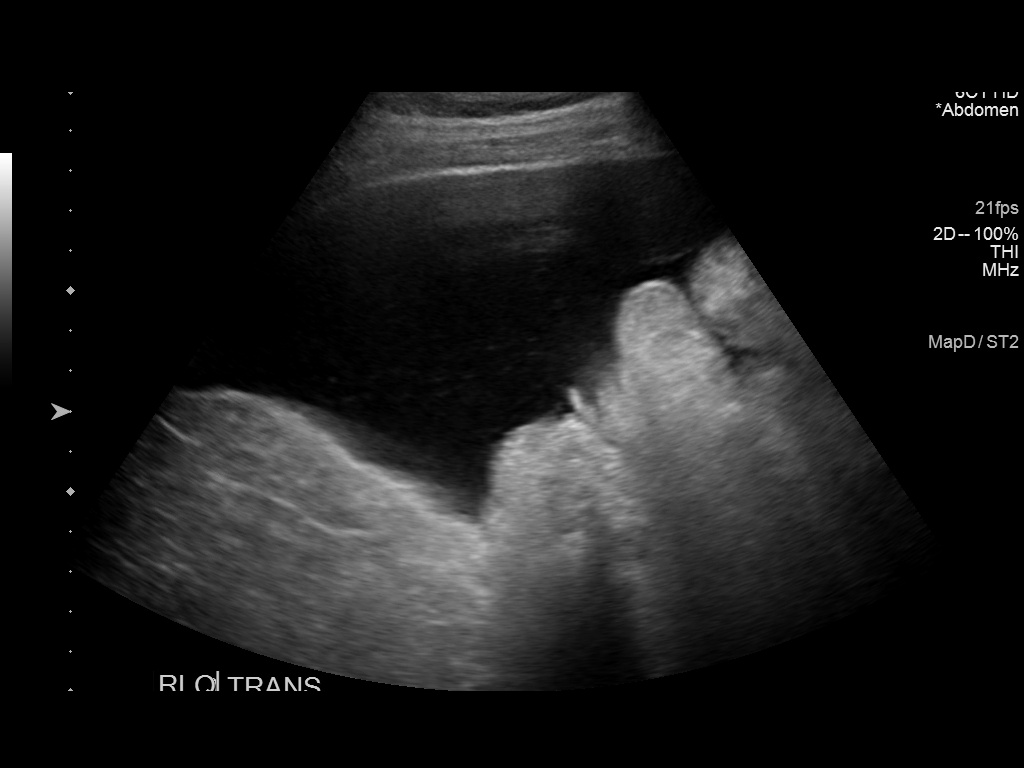
[im 3/6]
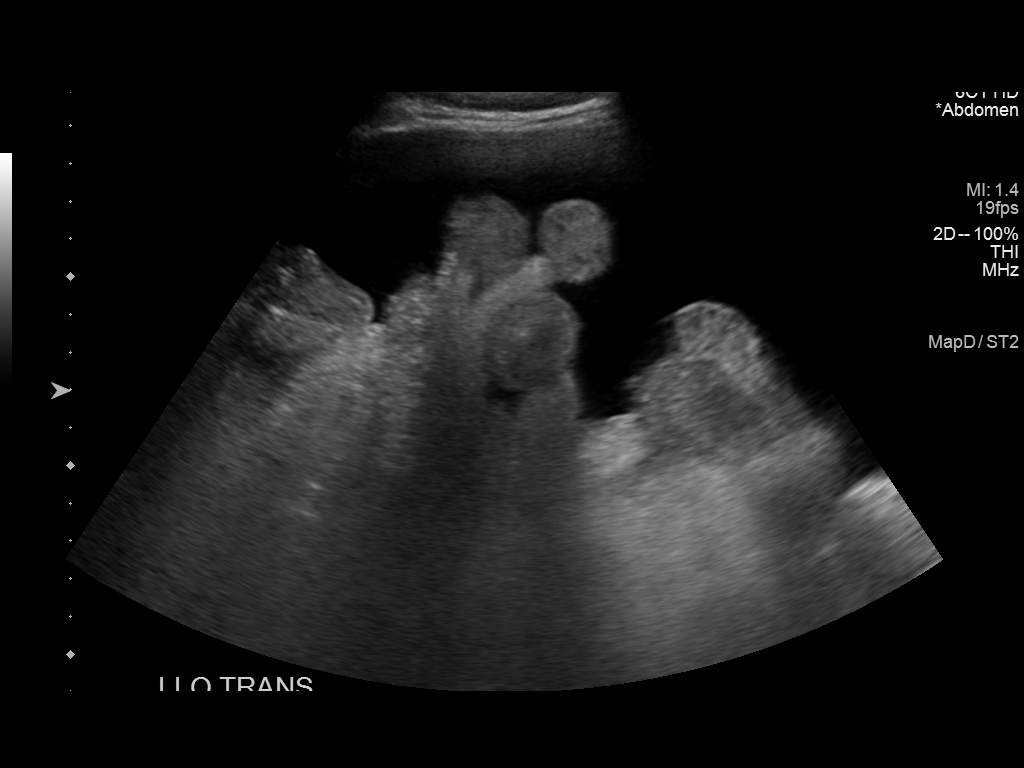
[im 4/6]
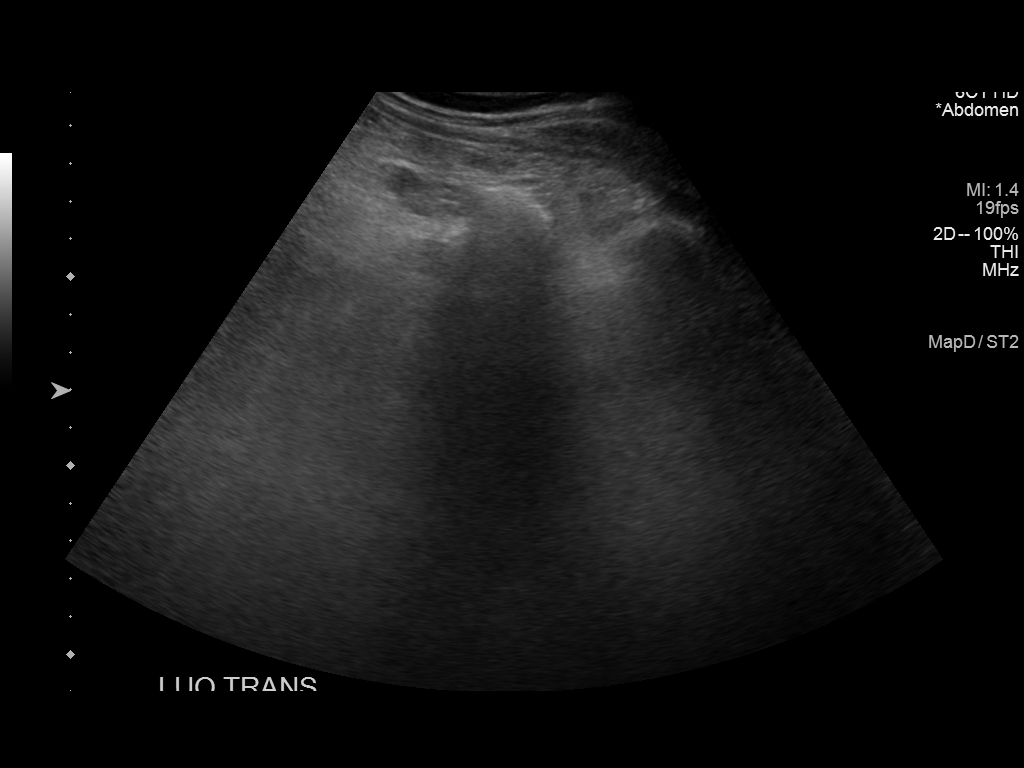
[im 5/6]
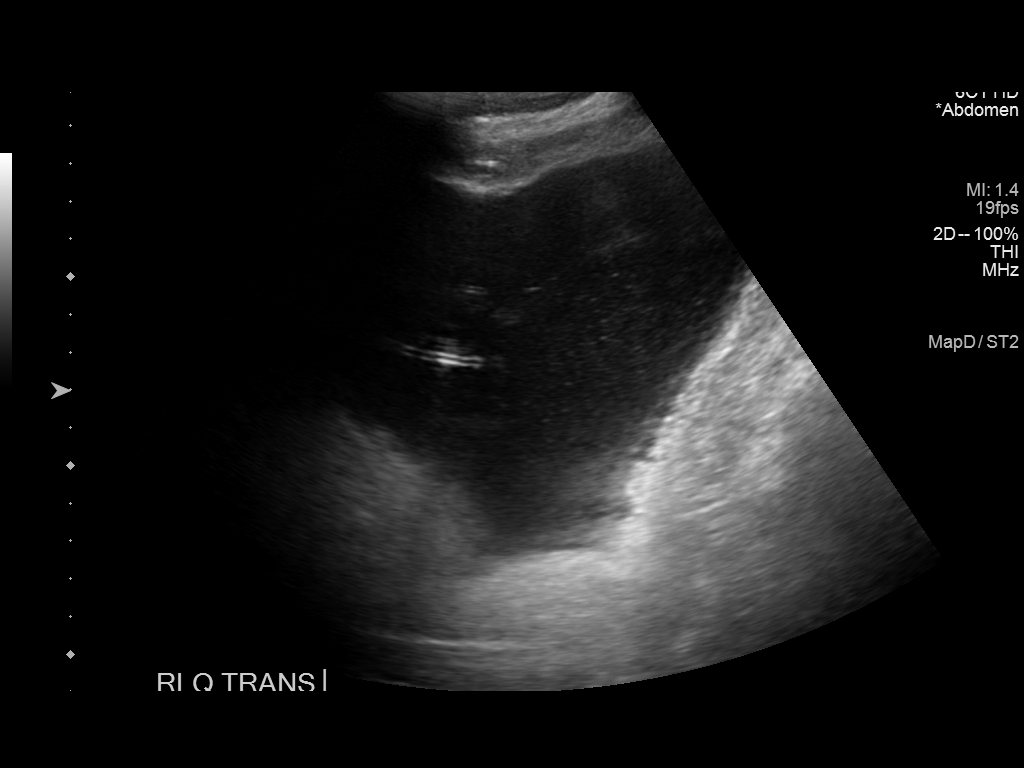
[im 6/6]
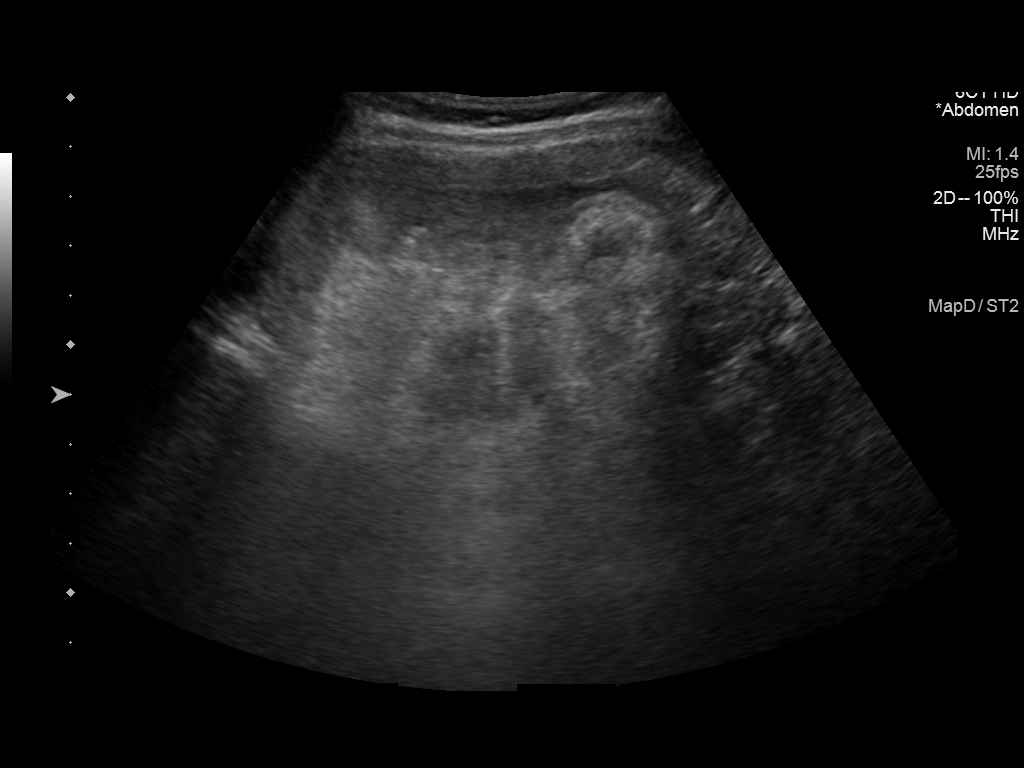

[6 of 6 positions shown; findings below may reference images not displayed]

EXAM:
ULTRASOUND GUIDED RIGHT LOWER QUADRANT PARACENTESIS

MEDICATIONS:
None.

COMPLICATIONS:
None immediate.

PROCEDURE:
Informed written consent was obtained from the patient after a
discussion of the risks, benefits and alternatives to treatment. A
timeout was performed prior to the initiation of the procedure.

Initial ultrasound scanning demonstrates a large amount of ascites
within the right lower abdominal quadrant. The right lower abdomen
was prepped and draped in the usual sterile fashion. 1% lidocaine
with epinephrine was used for local anesthesia.

Following this, a Safe-T-Centesis catheter was introduced. An
ultrasound image was saved for documentation purposes. The
paracentesis was performed. The catheter was removed and a dressing
was applied. The patient tolerated the procedure well without
immediate post procedural complication.
FINDINGS: A total of approximately 5.6 L of clear yellow fluid was removed.
IMPRESSION: Successful ultrasound-guided paracentesis yielding 5.6 liters of
peritoneal fluid.

## 2019-07-06 IMAGING — US US PARACENTESIS
1 series · 3 of 3 positions shown · non-contrast
Comparison: Multiple previous ultrasound-guided paracenteses, most
recently on 10/15/2016

MEDICATIONS:
None.

COMPLICATIONS:
None immediate.

INDICATION: Recurrent symptomatic ascites.

EXAM:
ULTRASOUND-GUIDED PARACENTESIS
TECHNIQUE: Informed written consent was obtained from the patient after a
discussion of the risks, benefits and alternatives to treatment. A
timeout was performed prior to the initiation of the procedure.

[Series 1: us paracentesis · 0.30mm/px · 3 of 3 slices shown]
[im 1/3]
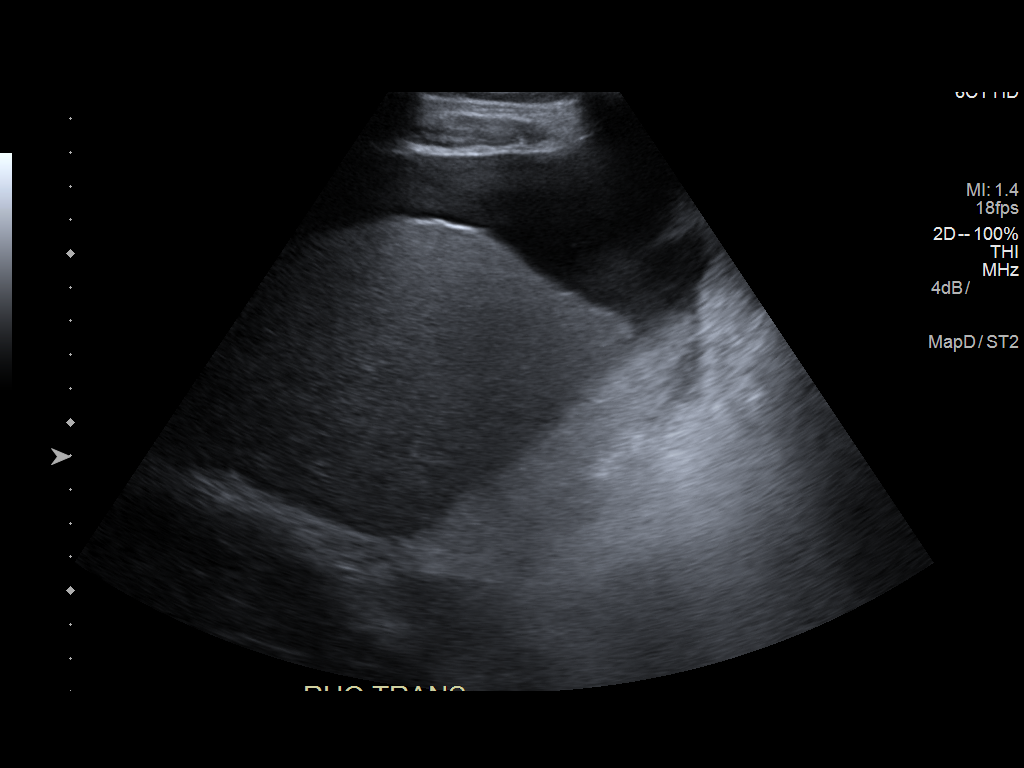
[im 2/3]
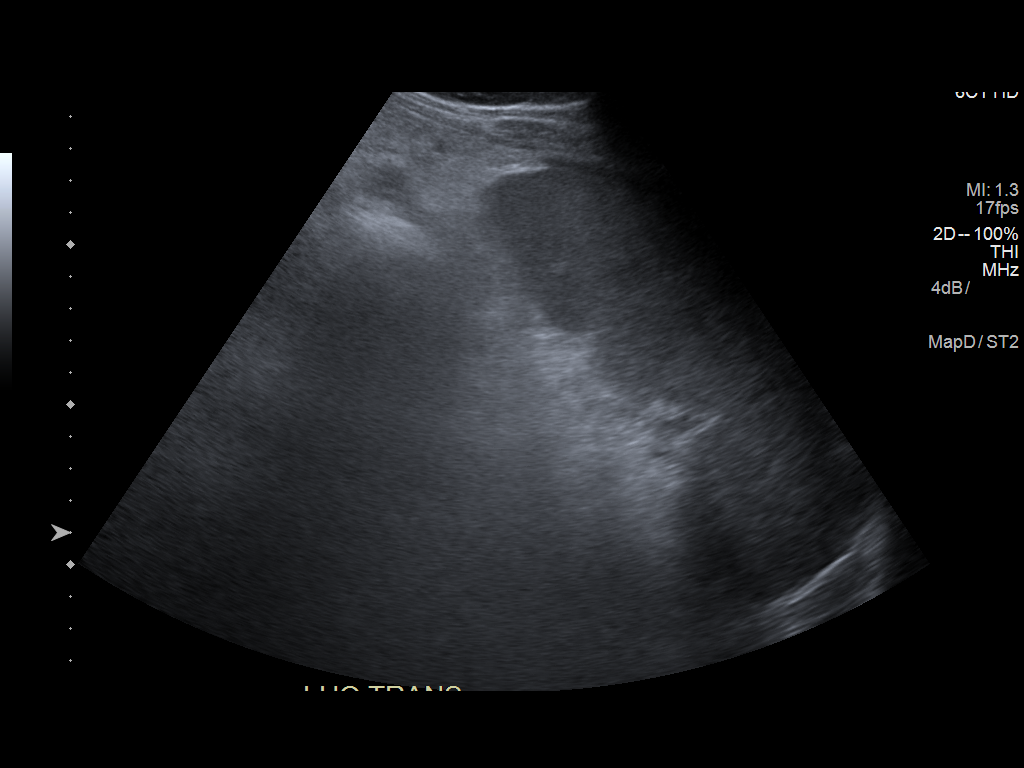
[im 3/3]
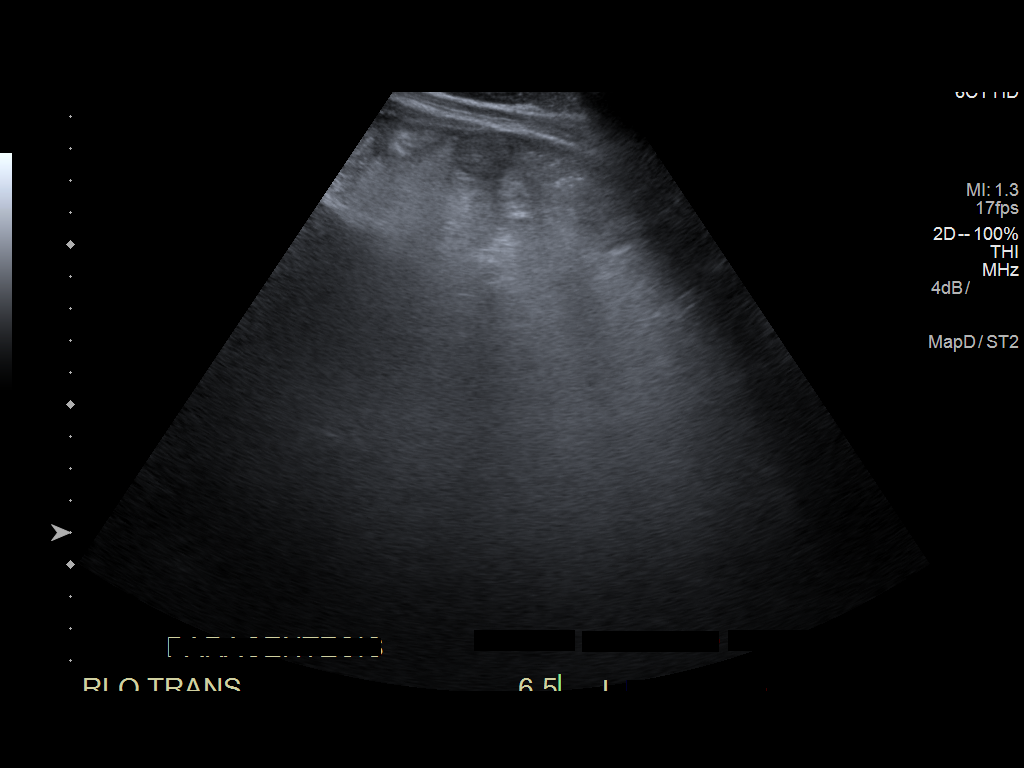

[3 of 3 positions shown; findings below may reference images not displayed]

Initial ultrasound scanning demonstrates a large amount of ascites
within the right lower abdominal quadrant. The right lower abdomen
was prepped and draped in the usual sterile fashion. 1% lidocaine
with epinephrine was used for local anesthesia. Under direct
ultrasound guidance, a 19 gauge, 7-cm, Yueh catheter was introduced.
An ultrasound image was saved for documentation purposed. An 8 Fr
Safe-T-Centesis catheter was introduced. The paracentesis was
performed. The catheter was removed and a dressing was applied. The
patient tolerated the procedure well without immediate post
procedural complication.
FINDINGS: A total of approximately 6.5 liters of serous fluid was removed.
IMPRESSION: Successful ultrasound-guided paracentesis yielding 6.5 liters of
peritoneal fluid.

## 2019-08-25 IMAGING — US US PARACENTESIS
1 series · 4 of 4 positions shown · non-contrast
Comparison: none

INDICATION: Cirrhosis.  Ascites.

[Series 1: us paracentesis · 0.30mm/px · 4 of 4 slices shown]
[im 1/4]
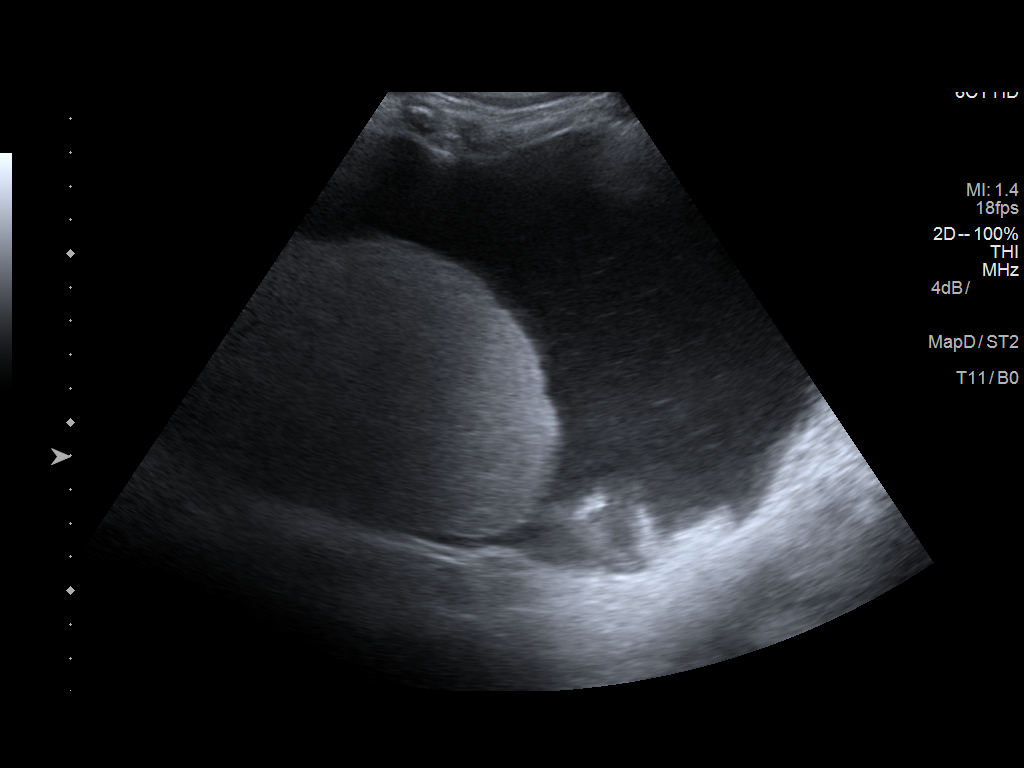
[im 2/4]
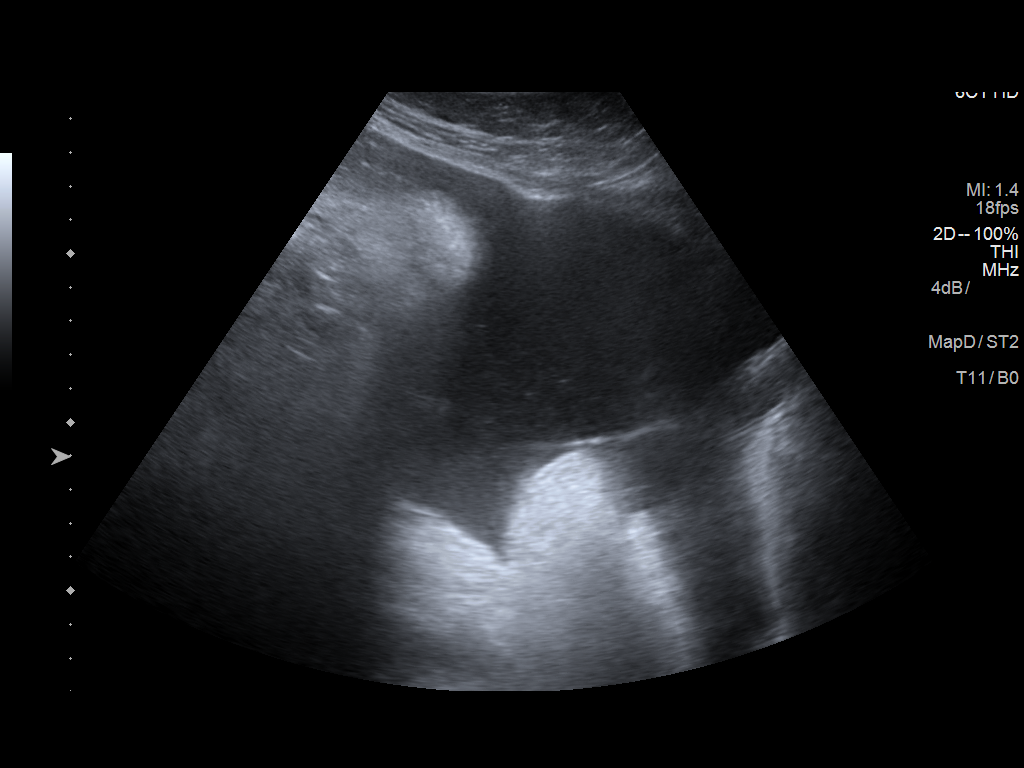
[im 3/4]
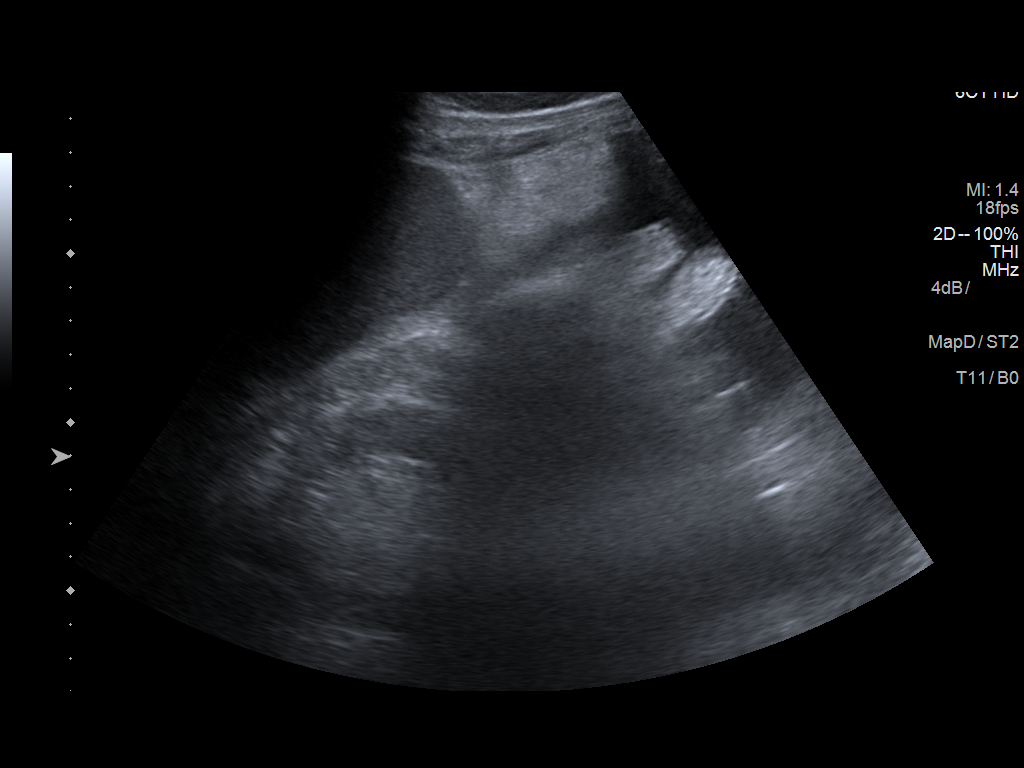
[im 4/4]
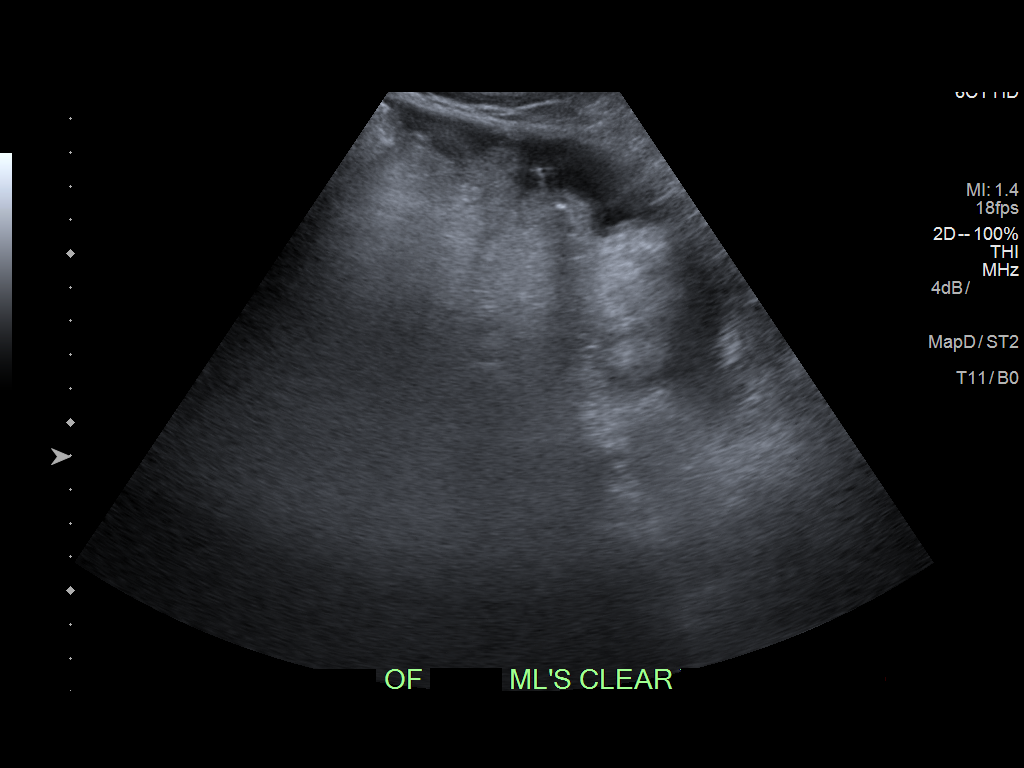

[4 of 4 positions shown; findings below may reference images not displayed]

EXAM:
ULTRASOUND GUIDED  PARACENTESIS

MEDICATIONS:
None.

COMPLICATIONS:
None immediate.

PROCEDURE:
Informed written consent was obtained from the patient after a
discussion of the risks, benefits and alternatives to treatment. A
timeout was performed prior to the initiation of the procedure.

Initial ultrasound scanning demonstrates a large amount of ascites
within the right lower abdominal quadrant. The right lower abdomen
was prepped and draped in the usual sterile fashion. 1% lidocaine
with epinephrine was used for local anesthesia.

Following this, a 6 French catheter was introduced. The paracentesis
was performed. The catheter was removed and a dressing was applied.
The patient tolerated the procedure well without immediate post
procedural complication.
FINDINGS: A total of approximately 1677 cc of clear yellow fluid was removed.
IMPRESSION: Successful ultrasound-guided paracentesis yielding 1677 cc of
peritoneal fluid.
